# Patient Record
Sex: Female | Born: 1960 | Race: White | Hispanic: No | Marital: Married | Smoking: Never smoker
Health system: Southern US, Community
[De-identification: ages and names within clinical notes are randomized; demographics above are authoritative.]

## PROBLEM LIST (undated history)

## (undated) DIAGNOSIS — R519 Headache, unspecified: Secondary | ICD-10-CM

## (undated) DIAGNOSIS — T7840XA Allergy, unspecified, initial encounter: Secondary | ICD-10-CM

## (undated) DIAGNOSIS — M8580 Other specified disorders of bone density and structure, unspecified site: Secondary | ICD-10-CM

## (undated) DIAGNOSIS — Z923 Personal history of irradiation: Secondary | ICD-10-CM

## (undated) DIAGNOSIS — E785 Hyperlipidemia, unspecified: Secondary | ICD-10-CM

## (undated) HISTORY — DX: Allergy, unspecified, initial encounter: T78.40XA

## (undated) HISTORY — DX: Hyperlipidemia, unspecified: E78.5

## (undated) HISTORY — PX: MOUTH SURGERY: SHX715

## (undated) HISTORY — PX: BREAST LUMPECTOMY: SHX2

## (undated) HISTORY — DX: Other specified disorders of bone density and structure, unspecified site: M85.80

## (undated) HISTORY — PX: POLYPECTOMY: SHX149

## (undated) HISTORY — PX: DILATION AND CURETTAGE OF UTERUS: SHX78

---
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
## 1997-12-21 ENCOUNTER — Inpatient Hospital Stay (HOSPITAL_COMMUNITY): Admission: AD | Admit: 1997-12-21 | Discharge: 1997-12-21 | Payer: Self-pay | Admitting: Obstetrics and Gynecology

## 1998-01-04 ENCOUNTER — Encounter (HOSPITAL_COMMUNITY): Admission: RE | Admit: 1998-01-04 | Discharge: 1998-02-10 | Payer: Self-pay | Admitting: Obstetrics and Gynecology

## 1998-02-09 ENCOUNTER — Inpatient Hospital Stay (HOSPITAL_COMMUNITY): Admission: AD | Admit: 1998-02-09 | Discharge: 1998-02-12 | Payer: Self-pay | Admitting: Obstetrics and Gynecology

## 1998-02-14 ENCOUNTER — Encounter: Admission: RE | Admit: 1998-02-14 | Discharge: 1998-05-15 | Payer: Self-pay | Admitting: Obstetrics and Gynecology

## 1998-12-09 ENCOUNTER — Encounter (HOSPITAL_COMMUNITY): Admission: RE | Admit: 1998-12-09 | Discharge: 1999-03-09 | Payer: Self-pay | Admitting: Obstetrics and Gynecology

## 2000-02-05 ENCOUNTER — Other Ambulatory Visit: Admission: RE | Admit: 2000-02-05 | Discharge: 2000-02-05 | Payer: Self-pay | Admitting: Obstetrics and Gynecology

## 2001-04-08 ENCOUNTER — Other Ambulatory Visit: Admission: RE | Admit: 2001-04-08 | Discharge: 2001-04-08 | Payer: Self-pay | Admitting: Obstetrics and Gynecology

## 2002-05-25 ENCOUNTER — Other Ambulatory Visit: Admission: RE | Admit: 2002-05-25 | Discharge: 2002-05-25 | Payer: Self-pay | Admitting: Obstetrics and Gynecology

## 2004-03-08 ENCOUNTER — Other Ambulatory Visit: Admission: RE | Admit: 2004-03-08 | Discharge: 2004-03-08 | Payer: Self-pay | Admitting: Obstetrics and Gynecology

## 2005-06-29 ENCOUNTER — Other Ambulatory Visit: Admission: RE | Admit: 2005-06-29 | Discharge: 2005-06-29 | Payer: Self-pay | Admitting: Obstetrics and Gynecology

## 2005-07-24 ENCOUNTER — Encounter: Admission: RE | Admit: 2005-07-24 | Discharge: 2005-07-24 | Payer: Self-pay | Admitting: Obstetrics and Gynecology

## 2010-10-28 ENCOUNTER — Encounter: Payer: Self-pay | Admitting: Obstetrics and Gynecology

## 2011-10-12 DIAGNOSIS — K122 Cellulitis and abscess of mouth: Secondary | ICD-10-CM

## 2012-04-09 ENCOUNTER — Ambulatory Visit: Payer: 59 | Admitting: Emergency Medicine

## 2012-04-09 VITALS — BP 138/80 | HR 64 | Temp 98.3°F | Resp 17 | Ht 65.0 in | Wt 131.6 lb

## 2012-04-09 DIAGNOSIS — H8110 Benign paroxysmal vertigo, unspecified ear: Secondary | ICD-10-CM

## 2012-04-09 MED ORDER — MECLIZINE HCL 32 MG PO TABS: 32.0000 mg | ORAL_TABLET | Freq: Three times a day (TID) | ORAL | Status: AC | PRN

## 2012-04-09 NOTE — Progress Notes (Deleted)
  Subjective:    Patient ID: Tamara Lutz, female    DOB: Jul 20, 1961, 51 y.o.   MRN: [id number]  HPI    Review of Systems     Objective:   Physical Exam        Assessment & Plan:

## 2012-04-09 NOTE — Patient Instructions (Addendum)
Vertigo Benign Positional Vertigo Vertigo means you feel like you or your surroundings are moving when they are not. Benign positional vertigo is the most common form of vertigo. Benign means that the cause of your condition is not serious. Benign positional vertigo is more common in older adults. CAUSES  Benign positional vertigo is the result of an upset in the labyrinth system. This is an area in the middle ear that helps control your balance. This may be caused by a viral infection, head injury, or repetitive motion. However, often no specific cause is found. SYMPTOMS  Symptoms of benign positional vertigo occur when you move your head or eyes in different directions. Some of the symptoms may include:  Loss of balance and falls.   Vomiting.   Blurred vision.   Dizziness.   Nausea.   Involuntary eye movements (nystagmus).  DIAGNOSIS  Benign positional vertigo is usually diagnosed by physical exam. If the specific cause of your benign positional vertigo is unknown, your caregiver may perform imaging tests, such as magnetic resonance imaging (MRI) or computed tomography (CT). TREATMENT  Your caregiver may recommend movements or procedures to correct the benign positional vertigo. Medicines such as meclizine, benzodiazepines, and medicines for nausea may be used to treat your symptoms. In rare cases, if your symptoms are caused by certain conditions that affect the inner ear, you may need surgery. HOME CARE INSTRUCTIONS   Follow your caregiver's instructions.   Move slowly. Do not make sudden body or head movements.   Avoid driving.   Avoid operating heavy machinery.   Avoid performing any tasks that would be dangerous to you or others during a vertigo episode.   Drink enough fluids to keep your urine clear or pale yellow.  SEEK IMMEDIATE MEDICAL CARE IF:   You develop problems with walking, weakness, numbness, or using your arms, hands, or legs.   You have difficulty  speaking.   You develop severe headaches.   Your nausea or vomiting continues or gets worse.   You develop visual changes.   Your family or friends notice any behavioral changes.   Your condition gets worse.   You have a fever.   You develop a stiff neck or sensitivity to light.  MAKE SURE YOU:   Understand these instructions.   Will watch your condition.   Will get help right away if you are not doing well or get worse.  Document Released: 07/02/2006 Document Revised: 09/13/2011 Document Reviewed: 06/14/2011 [facility] Patient Information 2012 [place], [place].

## 2012-04-09 NOTE — Progress Notes (Signed)
    Patient Name: Tamara Lutz Date of Birth: [birth year] Medical Record Number: [id number] Gender: female Date of Encounter: 04/09/2012  Chief Complaint: Dizziness   History of Present Illness:  Tamara Lutz is a 51 y.o. very pleasant female patient who presents with the following:  Awoke this am and stretched.  When she attempted to get up, she found that she was profoundly dizzy worse when turns head or changes axis of rotation.  No other neuro or visual symptoms.  Denies antecedent illness or injury. Had previous issue two weeks ago that was short lived  There is no problem list on file for this patient.  No past medical history on file. No past surgical history on file. History  Substance Use Topics  . Smoking status: Never Smoker   . Smokeless tobacco: Not on file  . Alcohol Use: Not on file   No family history on file. No Known Allergies  Medication list has been reviewed and updated.  No current outpatient prescriptions on file prior to visit.    Review of Systems:  As per HPI, otherwise negative.    Physical Examination: Filed Vitals:   04/09/12 1836  BP: 138/80  Pulse: 64  Temp: 98.3 F (36.8 C)  Resp: 17   Filed Vitals:   04/09/12 1836  Height: 5\' 5"  (1.651 m)  Weight: 131 lb 9.6 oz (59.693 kg)   Body mass index is 21.90 kg/(m^2). Ideal Body Weight: Weight in (lb) to have BMI = 25: 149.9   GEN: WDWN, NAD, Non-toxic, A & O x 3 HEENT: Atraumatic, Normocephalic. Neck supple. No masses, No LAD. Ears and Nose: No external deformity. CV: RRR, No M/G/R. No JVD. No thrill. No extra heart sounds. PULM: CTA B, no wheezes, crackles, rhonchi. No retractions. No resp. distress. No accessory muscle use. ABD: S, NT, ND, +BS. No rebound. No HSM. EXTR: No c/c/e NEURO Normal gait.  PSYCH: Normally interactive. Conversant. Not depressed or anxious appearing.  Calm demeanor.    EKG / Labs / Xrays: None available at time of encounter  Assessment and  Plan: Benign positional vertigo antivert  Carmelina Dane, MD

## 2012-04-11 ENCOUNTER — Telehealth: Payer: Self-pay

## 2012-04-11 NOTE — Telephone Encounter (Signed)
Pt's husband is calling in reference to pts vertigo medication that was sent over to the pharmacy two days ago, [facility] on [address] states that the strength that the rx was written for does not exsist. He states that the pharmacy faxed over something to [place] informing us of this situation but has not heard from [place]. Please advise. Best# [id number]

## 2012-04-12 MED ORDER — MECLIZINE HCL 25 MG PO TABS: 25.0000 mg | ORAL_TABLET | Freq: Three times a day (TID) | ORAL | Status: AC | PRN

## 2012-04-12 NOTE — Telephone Encounter (Signed)
rx was sent in for right strength

## 2012-04-12 NOTE — Telephone Encounter (Signed)
lmom that rx was sent in

## 2012-04-22 ENCOUNTER — Other Ambulatory Visit: Payer: Self-pay | Admitting: Obstetrics and Gynecology

## 2012-04-22 DIAGNOSIS — R928 Other abnormal and inconclusive findings on diagnostic imaging of breast: Secondary | ICD-10-CM

## 2012-04-29 ENCOUNTER — Ambulatory Visit
Admission: RE | Admit: 2012-04-29 | Discharge: 2012-04-29 | Disposition: A | Discharge status: Home / Self Care | Payer: 59 | Source: Ambulatory Visit | Attending: Obstetrics and Gynecology | Admitting: Obstetrics and Gynecology

## 2012-04-29 DIAGNOSIS — R928 Other abnormal and inconclusive findings on diagnostic imaging of breast: Secondary | ICD-10-CM

## 2013-05-29 ENCOUNTER — Other Ambulatory Visit: Payer: Self-pay | Admitting: Obstetrics and Gynecology

## 2013-05-29 DIAGNOSIS — Z803 Family history of malignant neoplasm of breast: Secondary | ICD-10-CM

## 2013-05-29 DIAGNOSIS — R922 Inconclusive mammogram: Secondary | ICD-10-CM

## 2017-07-31 ENCOUNTER — Ambulatory Visit (INDEPENDENT_AMBULATORY_CARE_PROVIDER_SITE_OTHER): Payer: Managed Care, Other (non HMO) | Admitting: Family Medicine

## 2017-07-31 ENCOUNTER — Encounter: Payer: Self-pay | Admitting: Family Medicine

## 2017-07-31 VITALS — BP 116/80 | HR 87 | Temp 99.3°F | Resp 18 | Ht 65.0 in | Wt 139.0 lb

## 2017-07-31 DIAGNOSIS — R519 Headache, unspecified: Secondary | ICD-10-CM

## 2017-07-31 DIAGNOSIS — R51 Headache: Secondary | ICD-10-CM

## 2017-07-31 DIAGNOSIS — J310 Chronic rhinitis: Secondary | ICD-10-CM | POA: Diagnosis not present

## 2017-07-31 DIAGNOSIS — J321 Chronic frontal sinusitis: Secondary | ICD-10-CM | POA: Diagnosis not present

## 2017-07-31 DIAGNOSIS — M62838 Other muscle spasm: Secondary | ICD-10-CM | POA: Diagnosis not present

## 2017-07-31 MED ORDER — SUMATRIPTAN 5 MG/ACT NA SOLN: 1.0000 | NASAL | 1 refills | Status: DC | PRN

## 2017-07-31 MED ORDER — MONTELUKAST SODIUM 10 MG PO TABS: 10.0000 mg | ORAL_TABLET | Freq: Every day | ORAL | 3 refills | Status: DC

## 2017-07-31 MED ORDER — FLUTICASONE PROPIONATE 50 MCG/ACT NA SUSP: 2.0000 | Freq: Two times a day (BID) | NASAL | 6 refills | Status: DC

## 2017-07-31 MED ORDER — AZITHROMYCIN 250 MG PO TABS: ORAL_TABLET | ORAL | 0 refills | Status: DC

## 2017-07-31 NOTE — Patient Instructions (Addendum)
1. For headaches use your tylenol as needed but for worsening headaches use imitrex nasal spray for a max of two doses.  2. For daily sinus congestion use flonase twice a day either after showering or after face   3. For daily allergy prevent start montelukast at bedtime and continue for at least a month A daily antihistamine like allegra is still needed to help with daytime symptoms.  4. Once the montelukast starts to work you may cut back on the flonase to once a day and the allegra as needed.   5. Aspercreme with lidocaine is great for stiff muscles  Today you are given a muscle relaxer for shoulder muscle spasms It causes dizziness so you should not operate machinery   IF you received an x-ray today, you will receive an invoice from [facility]. Please contact [facility] at [phone] with questions or concerns regarding your invoice.   IF you received labwork today, you will receive an invoice from [facility]. Please contact [facility] at [phone] with questions or concerns regarding your invoice.   Our billing staff will not be able to assist you with questions regarding bills from these companies.  You will be contacted with the lab results as soon as they are available. The fastest way to get your results is to activate your My Chart account. Instructions are located on the last page of this paperwork. If you have not heard from [place] regarding the results in 2 weeks, please contact this office.     Sinusitis, Adult Sinusitis is soreness and inflammation of your sinuses. Sinuses are hollow spaces in the bones around your face. They are located:  Around your eyes.  In the middle of your forehead.  Behind your nose.  In your cheekbones.  Your sinuses and nasal passages are lined with a stringy fluid (mucus). Mucus normally drains out of your sinuses. When your nasal tissues get inflamed or swollen, the mucus can get trapped or blocked so air cannot flow  through your sinuses. This lets bacteria, viruses, and funguses grow, and that leads to infection. Follow these instructions at home: Medicines  Take, use, or apply over-the-counter and prescription medicines only as told by your doctor. These may include nasal sprays.  If you were prescribed an antibiotic medicine, take it as told by your doctor. Do not stop taking the antibiotic even if you start to feel better. Hydrate and Humidify  Drink enough water to keep your pee (urine) clear or pale yellow.  Use a cool mist humidifier to keep the humidity level in your home above 50%.  Breathe in steam for 10-15 minutes, 3-4 times a day or as told by your doctor. You can do this in the bathroom while a hot shower is running.  Try not to spend time in cool or dry air. Rest  Rest as much as possible.  Sleep with your head raised (elevated).  Make sure to get enough sleep each night. General instructions  Put a warm, moist washcloth on your face 3-4 times a day or as told by your doctor. This will help with discomfort.  Wash your hands often with soap and water. If there is no soap and water, use hand sanitizer.  Do not smoke. Avoid being around people who are smoking (secondhand smoke).  Keep all follow-up visits as told by your doctor. This is important. Contact a doctor if:  You have a fever.  Your symptoms get worse.  Your symptoms do not get better within  10 days. Get help right away if:  You have a very bad headache.  You cannot stop throwing up (vomiting).  You have pain or swelling around your face or eyes.  You have trouble seeing.  You feel confused.  Your neck is stiff.  You have trouble breathing. This information is not intended to replace advice given to you by your health care provider. Make sure you discuss any questions you have with your health care provider. Document Released: 03/12/2008 Document Revised: 05/20/2016 Document Reviewed:  07/20/2015 Elsevier Interactive Patient Education  2018 [employer].     Muscle Cramps and Spasms Muscle cramps and spasms occur when a muscle or muscles tighten and you have no control over this tightening (involuntary muscle contraction). They are a common problem and can develop in any muscle. The most common place is in the calf muscles of the leg. Muscle cramps and muscle spasms are both involuntary muscle contractions, but there are some differences between the two:  Muscle cramps are painful. They come and go and may last a few seconds to 15 minutes. Muscle cramps are often more forceful and last longer than muscle spasms.  Muscle spasms may or may not be painful. They may also last just a few seconds or much longer.  Certain medical conditions, such as diabetes or Parkinson disease, can make it more likely to develop cramps or spasms. However, cramps or spasms are usually not caused by a serious underlying problem. Common causes include:  Overexertion.  Overuse from repetitive motions, or doing the same thing over and over.  Remaining in a certain position for a long period of time.  Improper preparation, form, or technique while playing a sport or doing an activity.  Dehydration.  Injury.  Side effects of some medicines.  Abnormally low levels of the salts and ions in your blood (electrolytes), especially potassium and calcium. This could happen if you are taking water pills (diuretics) or if you are pregnant.  In many cases, the cause of muscle cramps or spasms is unknown. Follow these instructions at home:  Stay well hydrated. Drink enough fluid to keep your urine clear or pale yellow.  Try massaging, stretching, and relaxing the affected muscle.  If directed, apply heat to tight or tense muscles as often as told by your health care provider. Use the heat source that your health care provider recommends, such as a moist heat pack or a heating pad. ? Place a towel  between your skin and the heat source. ? Leave the heat on for 20-30 minutes. ? Remove the heat if your skin turns bright red. This is especially important if you are unable to feel pain, heat, or cold. You may have a greater risk of getting burned.  If directed, put ice on the affected area. This may help if you are sore or have pain after a cramp or spasm. ? Put ice in a plastic bag. ? Place a towel between your skin and the bag. ? Leavethe ice on for 20 minutes, 2-3 times a day.  Take over-the-counter and prescription medicines only as told by your health care provider.  Pay attention to any changes in your symptoms. Contact a health care provider if:  Your cramps or spasms get more severe or happen more often.  Your cramps or spasms do not improve over time. This information is not intended to replace advice given to you by your health care provider. Make sure you discuss any questions you have with  your health care provider. Document Released: 03/16/2002 Document Revised: 10/26/2015 Document Reviewed: 06/28/2015 Elsevier Interactive Patient Education  2018 [employer].

## 2017-07-31 NOTE — Progress Notes (Signed)
Chief Complaint  Patient presents with  . Sinusitis    onset: 07/29/17, TRIED OTC MEDS WITH NO RELIEF, PAIN AND PRESSURE WITH CONGESTION, PAIN IN BACK OF NECK, NOT COUGHING UP ANY DRAINAGE    HPI   Sinusitis: Patient presents with congestion and headache  She takes a daily antihistamine with a benadryl at night. She reports that she has to take tylenol for sinus headaches and has to take a migraine headache pill When her sinus headaches get bad nothing works and she has to take sinus pressure and sinus She reports that her pain is worse on the left side  7/10 She denies dizziness She denies tinnitus She is a little nauseous     4 review of systems  History reviewed. No pertinent past medical history.  Current Outpatient Medications  Medication Sig Dispense Refill  . fluticasone (FLONASE) 50 MCG/ACT nasal spray Place 1 spray 2 (two) times daily into both nostrils. 48 g 3  . montelukast (SINGULAIR) 10 MG tablet Take 1 tablet (10 mg total) at bedtime by mouth. 90 tablet 3  . SUMAtriptan (IMITREX) 5 MG/ACT nasal spray Place 1 spray (5 mg total) into the nose every 2 (two) hours as needed for migraine. 1 Inhaler 1  . triamcinolone cream (KENALOG) 0.1 % Apply 1 application 2 (two) times daily topically. 15 g 1   No current facility-administered medications for this visit.     Allergies: No Known Allergies  History reviewed. No pertinent surgical history.  Social History   Socioeconomic History  . Marital status: Married    Spouse name: Not on file  . Number of children: Not on file  . Years of education: Not on file  . Highest education level: Not on file  Occupational History  . Not on file  Social Needs  . Financial resource strain: Not on file  . Food insecurity:    Worry: Not on file    Inability: Not on file  . Transportation needs:    Medical: Not on file    Non-medical: Not on file  Tobacco Use  . Smoking status: Never Smoker  . Smokeless tobacco: Never  Used  Substance and Sexual Activity  . Alcohol use: No  . Drug use: No  . Sexual activity: Yes  Lifestyle  . Physical activity:    Days per week: Not on file    Minutes per session: Not on file  . Stress: Not on file  Relationships  . Social connections:    Talks on phone: Not on file    Gets together: Not on file    Attends religious service: Not on file    Active member of club or organization: Not on file    Attends meetings of clubs or organizations: Not on file    Relationship status: Not on file  Other Topics Concern  . Not on file  Social History Narrative  . Not on file    History reviewed. No pertinent family history.   ROS Review of Systems See HPI Constitution:  No malaise No diaphoresis Skin: No rash or itching Eyes: no blurry vision, no double vision GU: no dysuria or hematuria Neuro: no dizziness or headaches  all others reviewed and negative   Objective: Vitals:   07/31/17 1526  BP: 116/80  Pulse: 87  Resp: 18  Temp: 99.3 F (37.4 C)  TempSrc: Oral  SpO2: 99%  Weight: 139 lb (63 kg)  Height: 5\' 5"  (1.651 m)    Physical Exam General:  alert, oriented, in NAD Head: normocephalic, atraumatic, +frontal sinus tenderness Eyes: EOM intact, no scleral icterus or conjunctival injection Ears: TM clear bilaterally Nose: mucosa erythematous and edematous Throat: no pharyngeal exudate or erythema Lymph: no posterior auricular, submental or cervical lymph adenopathy Heart: normal rate, normal sinus rhythm, no murmurs Lungs: clear to auscultation bilaterally, no wheezing   Assessment and Plan Andreea was seen today for sinusitis.  Diagnoses and all orders for this visit:  Chronic rhinitis-  Discussed continued antihistamine flonase   Sinus headache Advised antihistamine and imitrex -     SUMAtriptan (IMITREX) 5 MG/ACT nasal spray; Place 1 spray (5 mg total) into the nose every 2 (two) hours as needed for migraine.  Chronic frontal sinusitis   Singulair, zithromax  Muscle spasms of neck Topical rub with aspercreme    Reida Hem A Nolon Rod

## 2017-08-13 NOTE — Progress Notes (Signed)
Chief Complaint  Patient presents with  . Rash    both wrist, using caladril on it.  Onset: one month ago went away and now back for approx. 2 weeks, very itchy but not painful    HPI   Patient has a rash on both wrist that she thought might have been poison oak but she has not been out in the yard or exposed. She denies any history of new detergents or exposures to new lotions. She used caladryl without improvement.  She is going to [place] and wants to get some treatment before it gets worse.  4 review of systems  No past medical history on file.  Current Outpatient Medications  Medication Sig Dispense Refill  . fluticasone (FLONASE) 50 MCG/ACT nasal spray Place 1 spray 2 (two) times daily into both nostrils. 48 g 3  . montelukast (SINGULAIR) 10 MG tablet Take 1 tablet (10 mg total) at bedtime by mouth. 90 tablet 3  . SUMAtriptan (IMITREX) 5 MG/ACT nasal spray Place 1 spray (5 mg total) into the nose every 2 (two) hours as needed for migraine. 1 Inhaler 1  . triamcinolone cream (KENALOG) 0.1 % Apply 1 application 2 (two) times daily topically. 15 g 1   No current facility-administered medications for this visit.     Allergies: No Known Allergies  No past surgical history on file.  Social History   Socioeconomic History  . Marital status: Married    Spouse name: None  . Number of children: None  . Years of education: None  . Highest education level: None  Social Needs  . Financial resource strain: None  . Food insecurity - worry: None  . Food insecurity - inability: None  . Transportation needs - medical: None  . Transportation needs - non-medical: None  Occupational History  . None  Tobacco Use  . Smoking status: Never Smoker  . Smokeless tobacco: Never Used  Substance and Sexual Activity  . Alcohol use: No  . Drug use: No  . Sexual activity: Yes  Other Topics Concern  . None  Social History Narrative  . None    No family history on file.   ROS Review of  Systems See HPI Constitution: No fevers or chills No malaise No diaphoresis Skin: No rash or itching Eyes: no blurry vision, no double vision GU: no dysuria or hematuria Neuro: no dizziness or headaches * all others reviewed and negative   Objective: Vitals:   08/14/17 1627  BP: 124/70  Pulse: 78  Resp: 16  Temp: 98.7 F (37.1 C)  TempSrc: Oral  SpO2: 98%  Weight: 140 lb 3.2 oz (63.6 kg)  Height: 5\' 5"  (1.651 m)    Physical Exam  Constitutional: She appears well-developed and well-nourished.  HENT:  Head: Normocephalic and atraumatic.  Eyes: Conjunctivae and EOM are normal.  Cardiovascular: Normal rate, regular rhythm and normal heart sounds.  Pulmonary/Chest: Effort normal and breath sounds normal. No stridor. No respiratory distress. She has no wheezes.  Skin: Skin is warm. Rash noted. No abrasion, no bruising, no burn, no ecchymosis, no laceration and no petechiae noted. Rash is vesicular.       Assessment and Plan Tamara Lutz was seen today for rash.  Diagnoses and all orders for this visit:  Rash, vesicular- advise topical steroid, mild soap and only luke warm water -     triamcinolone cream (KENALOG) 0.1 %; Apply 1 application 2 (two) times daily topically.  Other orders -     Cancel: Flu  Vaccine QUAD 36+ mos IM -     Cancel: Tdap vaccine greater than or equal to 7yo IM -     Cancel: MM Digital Screening; Future -     Cancel: Ambulatory referral to Gastroenterology -     montelukast (SINGULAIR) 10 MG tablet; Take 1 tablet (10 mg total) at bedtime by mouth. -     fluticasone (FLONASE) 50 MCG/ACT nasal spray; Place 1 spray 2 (two) times daily into both nostrils.     [place]

## 2017-08-14 ENCOUNTER — Encounter: Payer: Self-pay | Admitting: Family Medicine

## 2017-08-14 ENCOUNTER — Ambulatory Visit: Payer: Managed Care, Other (non HMO) | Admitting: Family Medicine

## 2017-08-14 ENCOUNTER — Ambulatory Visit (INDEPENDENT_AMBULATORY_CARE_PROVIDER_SITE_OTHER): Payer: Managed Care, Other (non HMO) | Admitting: Family Medicine

## 2017-08-14 VITALS — BP 124/70 | HR 78 | Temp 98.7°F | Resp 16 | Ht 65.0 in | Wt 140.2 lb

## 2017-08-14 DIAGNOSIS — R238 Other skin changes: Secondary | ICD-10-CM

## 2017-08-14 MED ORDER — TRIAMCINOLONE ACETONIDE 0.1 % EX CREA: 1.0000 "application " | TOPICAL_CREAM | Freq: Two times a day (BID) | CUTANEOUS | 1 refills | Status: DC

## 2017-08-14 MED ORDER — FLUTICASONE PROPIONATE 50 MCG/ACT NA SUSP: 1.0000 | Freq: Two times a day (BID) | NASAL | 3 refills | Status: DC

## 2017-08-14 MED ORDER — MONTELUKAST SODIUM 10 MG PO TABS: 10.0000 mg | ORAL_TABLET | Freq: Every day | ORAL | 3 refills | Status: DC

## 2017-08-14 NOTE — Patient Instructions (Addendum)
     IF you received an x-ray today, you will receive an invoice from [facility]. Please contact [facility] at [phone] with questions or concerns regarding your invoice.   IF you received labwork today, you will receive an invoice from [place] [facility]. Please contact [facility] at [phone] with questions or concerns regarding your invoice.   Our billing staff will not be able to assist you with questions regarding bills from these companies.  You will be contacted with the lab results as soon as they are available. The fastest way to get your results is to activate your My Chart account. Instructions are located on the last page of this paperwork. If you have not heard from [place] regarding the results in 2 weeks, please contact this office.     Hand Dermatitis Hand dermatitis is a skin condition that causes small, itchy, raised dots or fluid-filled blisters to form over the palms of the hands. This condition may also be called hand eczema. What are the causes? The cause of this condition is not known. What increases the risk? This condition is more likely to develop in people who have a history of allergies, such as:  Hay fever.  Allergic asthma.  An allergy to latex.  Chemical exposure, injuries, and environmental irritants can make hand dermatitis worse. Washing your hands too often can remove natural oils, which can dry out the skin and contribute to outbreaks of this condition. What are the signs or symptoms? The most common symptom of this condition is intense itchiness. Cracks or grooves (fissures) on the fingers can also develop. Affected areas can be painful, especially areas where large blisters have formed. How is this diagnosed? This condition is diagnosed with a medical history and physical exam. How is this treated? This condition is treated with medicines, including:  Steroid creams and ointments.  Oral steroid medicines.  Antibiotic medicines.  These are prescribed if you have an infection.  Antihistamine medicines. These help to reduce itchiness.  Follow these instructions at home:  Take or apply over-the-counter and prescription medicines only as told by your health care provider.  If you were prescribed an antibiotic medicine, use it as told by your health care provider. Do not stop using the antibiotic even if you start to feel better.  Avoid washing your hands more often than necessary.  Avoid using harsh chemicals on your hands.  Wear protective gloves when you handle products that can irritate your skin.  Keep all follow-up visits as told by your health care provider. This is important. Contact a health care provider if:  Your rash does not improve during the first week of treatment.  Your rash is red or tender.  Your rash has pus coming from it.  Your rash spreads. This information is not intended to replace advice given to you by your health care provider. Make sure you discuss any questions you have with your health care provider. Document Released: 09/24/2005 Document Revised: 03/01/2016 Document Reviewed: 04/08/2015 Elsevier Interactive Patient Education  [employer].

## 2017-10-08 HISTORY — PX: COLONOSCOPY: SHX174

## 2018-02-16 ENCOUNTER — Encounter: Payer: Self-pay | Admitting: Family Medicine

## 2018-04-14 ENCOUNTER — Encounter: Payer: Self-pay | Admitting: Family Medicine

## 2018-04-14 ENCOUNTER — Ambulatory Visit (INDEPENDENT_AMBULATORY_CARE_PROVIDER_SITE_OTHER): Payer: Managed Care, Other (non HMO) | Admitting: Family Medicine

## 2018-04-14 VITALS — BP 108/70 | HR 83 | Temp 98.8°F | Resp 16 | Ht 64.0 in | Wt 141.8 lb

## 2018-04-14 DIAGNOSIS — Z0000 Encounter for general adult medical examination without abnormal findings: Secondary | ICD-10-CM

## 2018-04-14 DIAGNOSIS — R6889 Other general symptoms and signs: Secondary | ICD-10-CM

## 2018-04-14 DIAGNOSIS — Z13220 Encounter for screening for lipoid disorders: Secondary | ICD-10-CM | POA: Diagnosis not present

## 2018-04-14 DIAGNOSIS — Z1329 Encounter for screening for other suspected endocrine disorder: Secondary | ICD-10-CM

## 2018-04-14 DIAGNOSIS — B078 Other viral warts: Secondary | ICD-10-CM

## 2018-04-14 DIAGNOSIS — R519 Headache, unspecified: Secondary | ICD-10-CM

## 2018-04-14 DIAGNOSIS — R51 Headache: Secondary | ICD-10-CM | POA: Diagnosis not present

## 2018-04-14 DIAGNOSIS — Z131 Encounter for screening for diabetes mellitus: Secondary | ICD-10-CM

## 2018-04-14 DIAGNOSIS — Z1211 Encounter for screening for malignant neoplasm of colon: Secondary | ICD-10-CM

## 2018-04-14 DIAGNOSIS — Z23 Encounter for immunization: Secondary | ICD-10-CM | POA: Diagnosis not present

## 2018-04-14 DIAGNOSIS — G44229 Chronic tension-type headache, not intractable: Secondary | ICD-10-CM | POA: Diagnosis not present

## 2018-04-14 DIAGNOSIS — Z1159 Encounter for screening for other viral diseases: Secondary | ICD-10-CM

## 2018-04-14 MED ORDER — SUMATRIPTAN 5 MG/ACT NA SOLN: 1.0000 | NASAL | 1 refills | Status: DC | PRN

## 2018-04-14 NOTE — Patient Instructions (Addendum)
IF you received an x-ray today, you will receive an invoice from [facility]. Please contact [facility] at [phone] with questions or concerns regarding your invoice.   IF you received labwork today, you will receive an invoice from [facility]. Please contact [facility] at [phone] with questions or concerns regarding your invoice.   Our billing staff will not be able to assist you with questions regarding bills from these companies.  You will be contacted with the lab results as soon as they are available. The fastest way to get your results is to activate your My Chart account. Instructions are located on the last page of this paperwork. If you have not heard from [place] regarding the results in 2 weeks, please contact this office.     Colonoscopy, Adult A colonoscopy is an exam to look at the entire large intestine. During the exam, a lubricated, bendable tube is inserted into the anus and then passed into the rectum, colon, and other parts of the large intestine. A colonoscopy is often done as a part of normal colorectal screening or in response to certain symptoms, such as anemia, persistent diarrhea, abdominal pain, and blood in the stool. The exam can help screen for and diagnose medical problems, including:  Tumors.  Polyps.  Inflammation.  Areas of bleeding.  Tell a health care provider about:  Any allergies you have.  All medicines you are taking, including vitamins, herbs, eye drops, creams, and over-the-counter medicines.  Any problems you or family members have had with anesthetic medicines.  Any blood disorders you have.  Any surgeries you have had.  Any medical conditions you have.  Any problems you have had passing stool. What are the risks? Generally, this is a safe procedure. However, problems may occur, including:  Bleeding.  A tear in the intestine.  A reaction to medicines given during the exam.  Infection (rare).  What  happens before the procedure? Eating and drinking restrictions Follow instructions from your health care provider about eating and drinking, which may include:  A few days before the procedure - follow a low-fiber diet. Avoid nuts, seeds, dried fruit, raw fruits, and vegetables.  1-3 days before the procedure - follow a clear liquid diet. Drink only clear liquids, such as clear broth or bouillon, black coffee or tea, clear juice, clear soft drinks or sports drinks, gelatin dessert, and popsicles. Avoid any liquids that contain red or purple dye.  On the day of the procedure - do not eat or drink anything during the 2 hours before the procedure, or within the time period that your health care provider recommends.  Bowel prep If you were prescribed an oral bowel prep to clean out your colon:  Take it as told by your health care provider. Starting the day before your procedure, you will need to drink a large amount of medicated liquid. The liquid will cause you to have multiple loose stools until your stool is almost clear or light green.  If your skin or anus gets irritated from diarrhea, you may use these to relieve the irritation: ? Medicated wipes, such as adult wet wipes with aloe and vitamin E. ? A skin soothing-product like petroleum jelly.  If you vomit while drinking the bowel prep, take a break for up to 60 minutes and then begin the bowel prep again. If vomiting continues and you cannot take the bowel prep without vomiting, call your health care provider.  General instructions  Ask your health care  provider about changing or stopping your regular medicines. This is especially important if you are taking diabetes medicines or blood thinners.  Plan to have someone take you home from the hospital or clinic. What happens during the procedure?  An IV tube may be inserted into one of your veins.  You will be given medicine to help you relax (sedative).  To reduce your risk of  infection: ? Your health care team will wash or sanitize their hands. ? Your anal area will be washed with soap.  You will be asked to lie on your side with your knees bent.  Your health care provider will lubricate a long, thin, flexible tube. The tube will have a camera and a light on the end.  The tube will be inserted into your anus.  The tube will be gently eased through your rectum and colon.  Air will be delivered into your colon to keep it open. You may feel some pressure or cramping.  The camera will be used to take images during the procedure.  A small tissue sample may be removed from your body to be examined under a microscope (biopsy). If any potential problems are found, the tissue will be sent to a lab for testing.  If small polyps are found, your health care provider may remove them and have them checked for cancer cells.  The tube that was inserted into your anus will be slowly removed. The procedure may vary among health care providers and hospitals. What happens after the procedure?  Your blood pressure, heart rate, breathing rate, and blood oxygen level will be monitored until the medicines you were given have worn off.  Do not drive for 24 hours after the exam.  You may have a small amount of blood in your stool.  You may pass gas and have mild abdominal cramping or bloating due to the air that was used to inflate your colon during the exam.  It is up to you to get the results of your procedure. Ask your health care provider, or the department performing the procedure, when your results will be ready. This information is not intended to replace advice given to you by your health care provider. Make sure you discuss any questions you have with your health care provider. Document Released: 09/21/2000 Document Revised: 07/25/2016 Document Reviewed: 12/06/2015 Elsevier Interactive Patient Education  2018 [employer].

## 2018-04-14 NOTE — Progress Notes (Signed)
Chief Complaint  Patient presents with  . Annual Exam    cpe w/o pap    Subjective:  Tamara Lutz is a 57 y.o. female here for a health maintenance visit.  Patient is established pt  Warts She reports that she has two warts on her right hand not resolving with compound W and picking at it.  She has some irritation there. She had plantar warts but they resolved.  Headaches  She reports that her headaches are daily during the school year and starts from her neck and goes down into her shoulders and up into her head. Generic excedrin HA helps her. She has not been using the sumatriptan because she was only given 5. Once the school year lets out she has less episodes.  She denies any vision changes or upper extremity weakness with the headaches.   There are no active problems to display for this patient.   No past medical history on file.  No past surgical history on file.   Outpatient Medications Prior to Visit  Medication Sig Dispense Refill  . fluticasone (FLONASE) 50 MCG/ACT nasal spray Place 1 spray 2 (two) times daily into both nostrils. 48 g 3  . SUMAtriptan (IMITREX) 5 MG/ACT nasal spray Place 1 spray (5 mg total) into the nose every 2 (two) hours as needed for migraine. 1 Inhaler 1  . montelukast (SINGULAIR) 10 MG tablet Take 1 tablet (10 mg total) at bedtime by mouth. 90 tablet 3  . triamcinolone cream (KENALOG) 0.1 % Apply 1 application 2 (two) times daily topically. (Patient not taking: Reported on 04/14/2018) 15 g 1   No facility-administered medications prior to visit.     No Known Allergies   Family History  Problem Relation Age of Onset  . Multiple sclerosis Mother   . Uterine cancer Mother   . Alzheimer's disease Father   . Breast cancer Sister   . Diabetes Neg Hx   . Heart disease Neg Hx   . Hypertension Neg Hx      Health Habits: Dental Exam: up to date Eye Exam: up to date Exercise:  times/week on average Current exercise activities:  walking/running Diet: balanced   Social History   Socioeconomic History  . Marital status: Married    Spouse name: Not on file  . Number of children: Not on file  . Years of education: Not on file  . Highest education level: Not on file  Occupational History  . Not on file  Social Needs  . Financial resource strain: Not on file  . Food insecurity:    Worry: Not on file    Inability: Not on file  . Transportation needs:    Medical: Not on file    Non-medical: Not on file  Tobacco Use  . Smoking status: Never Smoker  . Smokeless tobacco: Never Used  Substance and Sexual Activity  . Alcohol use: No  . Drug use: No  . Sexual activity: Yes  Lifestyle  . Physical activity:    Days per week: Not on file    Minutes per session: Not on file  . Stress: Not on file  Relationships  . Social connections:    Talks on phone: Not on file    Gets together: Not on file    Attends religious service: Not on file    Active member of club or organization: Not on file    Attends meetings of clubs or organizations: Not on file    Relationship status:  Not on file  . Intimate partner violence:    Fear of current or ex partner: Not on file    Emotionally abused: Not on file    Physically abused: Not on file    Forced sexual activity: Not on file  Other Topics Concern  . Not on file  Social History Narrative  . Not on file   Social History   Substance and Sexual Activity  Alcohol Use No   Social History   Tobacco Use  Smoking Status Never Smoker  Smokeless Tobacco Never Used   Social History   Substance and Sexual Activity  Drug Use No    GYN: Sexual Health Menstrual status: regular menses LMP: Patient's last menstrual period was 04/09/2012. Last pap smear: see HM section History of abnormal pap smears:  Sexually active:  with female partner Current contraception: postmenopausal  Health Maintenance: See under health Maintenance activity for review of completion dates as  well. Immunization History  Administered Date(s) Administered  . Tdap 04/14/2018      Depression Screen-PHQ2/9 Depression screen [facility] 2/9 04/14/2018 08/14/2017 07/31/2017  Decreased Interest 0 0 0  Down, Depressed, Hopeless 0 0 0  PHQ - 2 Score 0 0 0     Depression Severity and Treatment Recommendations:  0-4= None  5-9= Mild / Treatment: Support, educate to call if worse; return in one month  10-14= Moderate / Treatment: Support, watchful waiting; Antidepressant or Psycotherapy  15-19= Moderately severe / Treatment: Antidepressant OR Psychotherapy  >= 20 = Major depression, severe / Antidepressant AND Psychotherapy    Review of Systems   Review of Systems  Constitutional: Negative for chills, fever and weight loss.  HENT: Negative for congestion, nosebleeds and sinus pain.   Respiratory: Negative for cough, shortness of breath and wheezing.   Cardiovascular: Negative for chest pain, palpitations and orthopnea.  Gastrointestinal: Negative for abdominal pain, nausea and vomiting.  Genitourinary: Negative for dysuria, frequency and urgency.  Musculoskeletal: Negative for myalgias and neck pain.  Skin: Negative for itching and rash.  Neurological: Negative for dizziness, tingling, tremors and headaches.  Psychiatric/Behavioral: Negative for depression. The patient is not nervous/anxious.     See HPI for ROS as well.    Objective:   Vitals:   04/14/18 0842  BP: 108/70  Pulse: 83  Resp: 16  Temp: 98.8 F (37.1 C)  TempSrc: Oral  SpO2: 98%  Weight: 141 lb 12.8 oz (64.3 kg)  Height: 5\' 4"  (1.626 m)    Body mass index is 24.34 kg/m.  Physical Exam  Constitutional: She is oriented to person, place, and time. She appears well-developed and well-nourished.  HENT:  Head: Normocephalic and atraumatic.  Right Ear: External ear normal.  Left Ear: External ear normal.  Mouth/Throat: Oropharynx is clear and moist.  Eyes: Conjunctivae and EOM are normal.  Neck: Normal  range of motion. Neck supple. No thyromegaly present.  Cardiovascular: Normal rate, regular rhythm and normal heart sounds.  No murmur heard. Pulmonary/Chest: Effort normal and breath sounds normal. No stridor. No respiratory distress. She has no wheezes. She has no rales.  Abdominal: Soft. Bowel sounds are normal. She exhibits no distension and no mass. There is no tenderness. There is no rebound and no guarding.  Musculoskeletal: Normal range of motion. She exhibits no edema.  Neurological: She is alert and oriented to person, place, and time.  Skin: Skin is warm. Capillary refill takes less than 2 seconds.  Wart noted on index finger and 4th digit  Psychiatric: She  has a normal mood and affect. Her behavior is normal. Judgment and thought content normal.       Assessment/Plan:   Patient was seen for a health maintenance exam.  Counseled the patient on health maintenance issues. Reviewed her health mainteance schedule and ordered appropriate tests (see orders.) Counseled on regular exercise and weight management. Recommend regular eye exams and dental cleaning.   The following issues were addressed today for health maintenance:   Tamara Lutz was seen today for annual exam.  Diagnoses and all orders for this visit:  Encounter for health maintenance examination in adult Women's Health Maintenance Plan Advised monthly breast exam and annual mammogram Advised dental exam every six months Discussed stress management Discussed pap smear screening guidelines  -     HCV Ab w/Rflx to Verification -     Comprehensive metabolic panel -     Lipid panel  Special screening for malignant neoplasms, colon -     Ambulatory referral to Gastroenterology  Need for Tdap vaccination -     Tdap vaccine greater than or equal to 7yo IM  Screening for thyroid disorder  Screening, lipid -     Comprehensive metabolic panel -     Lipid panel  Heat intolerance -     TSH  Screening for diabetes  mellitus -     Hemoglobin A1c  Encounter for hepatitis C screening test for low risk patient -     HCV Ab w/Rflx to Verification  Other viral warts- froze 2 warts with cryotherapy performed today  Sinus headache- refilled imitrex -     SUMAtriptan (IMITREX) 5 MG/ACT nasal spray; Place 1 spray (5 mg total) into the nose every 2 (two) hours as needed for migraine.  Chronic tension-type headache, not intractable- referral to PT so that she can get some relief as her musculoskeletal pain  -     Ambulatory referral to Physical Therapy -     SUMAtriptan (IMITREX) 5 MG/ACT nasal spray; Place 1 spray (5 mg total) into the nose every 2 (two) hours as needed for migraine.    No follow-ups on file.    Body mass index is 24.34 kg/m.:  Discussed the patient's BMI with patient. The BMI body mass index is 24.34 kg/m.     No future appointments.  Patient Instructions       IF you received an x-ray today, you will receive an invoice from [facility]. Please contact [facility] at [phone] with questions or concerns regarding your invoice.   IF you received labwork today, you will receive an invoice from [facility]. Please contact [facility] at [phone] with questions or concerns regarding your invoice.   Our billing staff will not be able to assist you with questions regarding bills from these companies.  You will be contacted with the lab results as soon as they are available. The fastest way to get your results is to activate your My Chart account. Instructions are located on the last page of this paperwork. If you have not heard from [place] regarding the results in 2 weeks, please contact this office.     Colonoscopy, Adult A colonoscopy is an exam to look at the entire large intestine. During the exam, a lubricated, bendable tube is inserted into the anus and then passed into the rectum, colon, and other parts of the large intestine. A colonoscopy is often done as a  part of normal colorectal screening or in response to certain symptoms, such as anemia, persistent diarrhea, abdominal  pain, and blood in the stool. The exam can help screen for and diagnose medical problems, including:  Tumors.  Polyps.  Inflammation.  Areas of bleeding.  Tell a health care provider about:  Any allergies you have.  All medicines you are taking, including vitamins, herbs, eye drops, creams, and over-the-counter medicines.  Any problems you or family members have had with anesthetic medicines.  Any blood disorders you have.  Any surgeries you have had.  Any medical conditions you have.  Any problems you have had passing stool. What are the risks? Generally, this is a safe procedure. However, problems may occur, including:  Bleeding.  A tear in the intestine.  A reaction to medicines given during the exam.  Infection (rare).  What happens before the procedure? Eating and drinking restrictions Follow instructions from your health care provider about eating and drinking, which may include:  A few days before the procedure - follow a low-fiber diet. Avoid nuts, seeds, dried fruit, raw fruits, and vegetables.  1-3 days before the procedure - follow a clear liquid diet. Drink only clear liquids, such as clear broth or bouillon, black coffee or tea, clear juice, clear soft drinks or sports drinks, gelatin dessert, and popsicles. Avoid any liquids that contain red or purple dye.  On the day of the procedure - do not eat or drink anything during the 2 hours before the procedure, or within the time period that your health care provider recommends.  Bowel prep If you were prescribed an oral bowel prep to clean out your colon:  Take it as told by your health care provider. Starting the day before your procedure, you will need to drink a large amount of medicated liquid. The liquid will cause you to have multiple loose stools until your stool is almost clear or  light green.  If your skin or anus gets irritated from diarrhea, you may use these to relieve the irritation: ? Medicated wipes, such as adult wet wipes with aloe and vitamin E. ? A skin soothing-product like petroleum jelly.  If you vomit while drinking the bowel prep, take a break for up to 60 minutes and then begin the bowel prep again. If vomiting continues and you cannot take the bowel prep without vomiting, call your health care provider.  General instructions  Ask your health care provider about changing or stopping your regular medicines. This is especially important if you are taking diabetes medicines or blood thinners.  Plan to have someone take you home from the hospital or clinic. What happens during the procedure?  An IV tube may be inserted into one of your veins.  You will be given medicine to help you relax (sedative).  To reduce your risk of infection: ? Your health care team will wash or sanitize their hands. ? Your anal area will be washed with soap.  You will be asked to lie on your side with your knees bent.  Your health care provider will lubricate a long, thin, flexible tube. The tube will have a camera and a light on the end.  The tube will be inserted into your anus.  The tube will be gently eased through your rectum and colon.  Air will be delivered into your colon to keep it open. You may feel some pressure or cramping.  The camera will be used to take images during the procedure.  A small tissue sample may be removed from your body to be examined under a microscope (biopsy). If any  potential problems are found, the tissue will be sent to a lab for testing.  If small polyps are found, your health care provider may remove them and have them checked for cancer cells.  The tube that was inserted into your anus will be slowly removed. The procedure may vary among health care providers and hospitals. What happens after the procedure?  Your blood  pressure, heart rate, breathing rate, and blood oxygen level will be monitored until the medicines you were given have worn off.  Do not drive for 24 hours after the exam.  You may have a small amount of blood in your stool.  You may pass gas and have mild abdominal cramping or bloating due to the air that was used to inflate your colon during the exam.  It is up to you to get the results of your procedure. Ask your health care provider, or the department performing the procedure, when your results will be ready. This information is not intended to replace advice given to you by your health care provider. Make sure you discuss any questions you have with your health care provider. Document Released: 09/21/2000 Document Revised: 07/25/2016 Document Reviewed: 12/06/2015 Elsevier Interactive Patient Education  2018 [employer].

## 2018-04-15 ENCOUNTER — Other Ambulatory Visit: Payer: Self-pay | Admitting: Family Medicine

## 2018-04-15 ENCOUNTER — Encounter: Payer: Self-pay | Admitting: Gastroenterology

## 2018-04-15 LAB — COMPREHENSIVE METABOLIC PANEL
ALT: 19 IU/L (ref 0–32)
AST: 23 IU/L (ref 0–40)
Albumin/Globulin Ratio: 1.7 (ref 1.2–2.2)
Albumin: 4.6 g/dL (ref 3.5–5.5)
Alkaline Phosphatase: 88 IU/L (ref 39–117)
BUN/Creatinine Ratio: 20 (ref 9–23)
BUN: 17 mg/dL (ref 6–24)
Bilirubin Total: <0.2 mg/dL (ref 0.0–1.2)
CO2: 24 mmol/L (ref 20–29)
Calcium: 9.8 mg/dL (ref 8.7–10.2)
Chloride: 105 mmol/L (ref 96–106)
Creatinine, Ser: 0.86 mg/dL (ref 0.57–1.00)
GFR calc Af Amer: 87 mL/min/{1.73_m2} (ref >59)
GFR calc non Af Amer: 76 mL/min/{1.73_m2} (ref >59)
Globulin, Total: 2.7 g/dL (ref 1.5–4.5)
Glucose: 74 mg/dL (ref 65–99)
Potassium: 4.2 mmol/L (ref 3.5–5.2)
Sodium: 143 mmol/L (ref 134–144)
Total Protein: 7.3 g/dL (ref 6.0–8.5)

## 2018-04-15 LAB — HCV INTERPRETATION

## 2018-04-15 LAB — LIPID PANEL
Chol/HDL Ratio: 5 ratio — ABNORMAL HIGH (ref 0.0–4.4)
Cholesterol, Total: 273 mg/dL — ABNORMAL HIGH (ref 100–199)
HDL: 55 mg/dL (ref >39)
LDL Calculated: 198 mg/dL — ABNORMAL HIGH (ref 0–99)
Triglycerides: 99 mg/dL (ref 0–149)
VLDL Cholesterol Cal: 20 mg/dL (ref 5–40)

## 2018-04-15 LAB — HEMOGLOBIN A1C
Est. average glucose Bld gHb Est-mCnc: 100 mg/dL
Hgb A1c MFr Bld: 5.1 % (ref 4.8–5.6)

## 2018-04-15 LAB — TSH: TSH: 3.1 u[IU]/mL (ref 0.450–4.500)

## 2018-04-15 LAB — HCV AB W/RFLX TO VERIFICATION: HCV Ab: <0.1 s/co ratio (ref 0.0–0.9)

## 2018-04-15 MED ORDER — ATORVASTATIN CALCIUM 10 MG PO TABS: 10.0000 mg | ORAL_TABLET | Freq: Every day | ORAL | 0 refills | Status: DC

## 2018-04-30 ENCOUNTER — Ambulatory Visit (AMBULATORY_SURGERY_CENTER): Payer: Self-pay

## 2018-04-30 VITALS — Ht 64.0 in | Wt 144.4 lb

## 2018-04-30 DIAGNOSIS — Z1211 Encounter for screening for malignant neoplasm of colon: Secondary | ICD-10-CM

## 2018-04-30 MED ORDER — SOD PICOSULFATE-MAG OX-CIT ACD 10-3.5-12 MG-GM -GM/160ML PO SOLN: 1.0000 | Freq: Once | ORAL | 0 refills | Status: AC

## 2018-04-30 NOTE — Progress Notes (Signed)
Denies allergies to eggs or soy products. Denies complication of anesthesia or sedation. Denies use of weight loss medication. Denies use of O2.   Emmi instructions declined.   Coupon pay as little as 40 dollars for Clenpiq was given to patient.

## 2018-05-01 ENCOUNTER — Encounter: Payer: Self-pay | Admitting: Physical Therapy

## 2018-05-01 ENCOUNTER — Ambulatory Visit: Payer: Managed Care, Other (non HMO) | Attending: Family Medicine | Admitting: Physical Therapy

## 2018-05-01 ENCOUNTER — Other Ambulatory Visit: Payer: Self-pay

## 2018-05-01 DIAGNOSIS — R252 Cramp and spasm: Secondary | ICD-10-CM | POA: Diagnosis present

## 2018-05-01 DIAGNOSIS — G44201 Tension-type headache, unspecified, intractable: Secondary | ICD-10-CM | POA: Insufficient documentation

## 2018-05-01 NOTE — Therapy (Signed)
[place] [place], [place], [address] Phone: [phone]   Fax:  [phone]  Physical Therapy Evaluation  Patient Details  Name: Tamara Lutz MRN: [id number] Date of Birth: [birth year] Referring Provider: Nolon Rod   Encounter Date: 05/01/2018  PT End of Session - 05/01/18 1513    Visit Number  1    Date for PT Re-Evaluation  07/02/18    PT Start Time  1440    PT Stop Time  1535    PT Time Calculation (min)  55 min    Activity Tolerance  Patient tolerated treatment well    Behavior During Therapy  [facility] for tasks assessed/performed       Past Medical History:  Diagnosis Date  . Allergy   . Hyperlipidemia   . Osteopenia     Past Surgical History:  Procedure Laterality Date  . CESAREAN SECTION    . DILATION AND CURETTAGE OF UTERUS    . MOUTH SURGERY      There were no vitals filed for this visit.   Subjective Assessment - 05/01/18 1445    Subjective  Patient reports stress and tension in the neck area mostly onthe right for many years, reports HA's have been worse over the past two years.  Mostly on the right and the posterior head.    Patient Stated Goals  have less HA    Currently in Pain?  Yes    Pain Score  1     Pain Location  Head    Pain Orientation  Right    Pain Descriptors / Indicators  Aching;Sore;Spasm    Pain Type  Chronic pain    Pain Radiating Towards  denies    Pain Onset  More than a month ago    Pain Frequency  Constant    Aggravating Factors   work, stress, reading and writing bent over position 8/10    Pain Relieving Factors  medication, exercise,  muscle rub helps at times, pain can be 1-2/10    Effect of Pain on Daily Activities  just really hurts         [facility] PT Assessment - 05/01/18 0001      Assessment   Medical Diagnosis  tension HA    Referring Provider  Stallings    Onset Date/Surgical Date  04/01/18    Prior Therapy  no      Precautions   Precautions  None      Balance Screen   Has the patient fallen in the past 6 months  No    Has the patient had a decrease in activity level because of a fear of falling?   No    Is the patient reluctant to leave their home because of a fear of falling?   No      Home Environment   Additional Comments  does housework, Haematologist,       Prior Function   Level of Independence  Independent    Vocation  Full time employment    Veterinary surgeon    Leisure  some step aerobics, walks      Posture/Postural Control   Posture Comments  slight forward head, rounded hsoulders      ROM / Strength   AROM / PROM / Strength  AROM;Strength      AROM   Overall AROM Comments  cervical ROM decreased 25% for all motions except extension and left side bending decreased  50% with pain in the right upper trap area      Strength   Overall Strength Comments  WFL's      Palpation   Palpation comment  significant tightness in the right upper trap and cervical area, tender with spasms, large trigger point in the right upper trap.                Objective measurements completed on examination: See above findings.      [place] Adult PT Treatment/Exercise - 05/01/18 0001      Modalities   Modalities  Electrical Stimulation;Moist Heat      Moist Heat Therapy   Number Minutes Moist Heat  15 Minutes    Moist Heat Location  Cervical      Electrical Stimulation   Electrical Stimulation Location  right upper trap area    Electrical Stimulation Action  IFC    Electrical Stimulation Parameters  supine    Electrical Stimulation Goals  Pain       Trigger Point Dry Needling - 05/01/18 1519    Consent Given?  Yes    Education Handout Provided  Yes    Muscles Treated Upper Body  Upper trapezius    Upper Trapezius Response  Twitch reponse elicited;Palpable increased muscle length           PT Education - 05/01/18 1502    Education Details  shoulder shrugs, cervical and  scapular retraction, upper trap and levator stretches    Person(s) Educated  Patient    Methods  Explanation    Comprehension  Verbalized understanding;Returned demonstration       PT Short Term Goals - 05/01/18 1517      PT SHORT TERM GOAL #1   Title  independent with initial HEP    Time  2    Period  Weeks    Status  New        PT Long Term Goals - 05/01/18 1517      PT LONG TERM GOAL #1   Title  report HA frequency decreased 50%    Time  8    Period  Weeks    Status  New      PT LONG TERM GOAL #2   Title  report HA pain decreased 50%    Time  8    Period  Weeks    Status  New      PT LONG TERM GOAL #3   Title  increase cervical ROM 25%    Time  8    Period  Weeks    Status  New      PT LONG TERM GOAL #4   Title  understand proper posture and body mechanics    Time  8    Period  Weeks    Status  New             Plan - 05/01/18 1514    Clinical Impression Statement  Patient reports that she has had tension HA's for a long time but reports that it is much worse over the past few years, she reports when she is working she has a HA daily.  She has significant tightness and spasms in the right upper trap that does send some pain to the right occipital area.  Mild forward head and shoulders.  Some scapular weakness    Clinical Presentation  Stable    Clinical Decision Making  Low    Rehab Potential  Good    PT  Frequency  1x / week    PT Duration  8 weeks    PT Treatment/Interventions  ADLs/Self Care Home Management;Cryotherapy;Electrical Stimulation;Moist Heat;Traction;Ultrasound;Therapeutic activities;Therapeutic exercise;Patient/family education;Manual techniques;Dry needling    PT Next Visit Plan  start gym exercises, could try STM as she was apprehensive about the dry needles    Consulted and Agree with Plan of Care  Patient       Patient will benefit from skilled therapeutic intervention in order to improve the following deficits and impairments:   Decreased range of motion, Increased muscle spasms, Pain, Improper body mechanics, Postural dysfunction  Visit Diagnosis: Acute intractable tension-type headache - Plan: PT plan of care cert/re-cert  Cramp and spasm - Plan: PT plan of care cert/re-cert     Problem List There are no active problems to display for this patient.   Sumner Boast., PT 05/01/2018, 5:25 PM  [place] [address] [place], [place], [address] Phone: [phone]   Fax:  [phone]  Name: JACQULYN BARRESI MRN: [id number] Date of Birth: 22-Sep-1961

## 2018-05-01 NOTE — Patient Instructions (Signed)

## 2018-05-13 ENCOUNTER — Ambulatory Visit: Payer: Managed Care, Other (non HMO) | Attending: Family Medicine | Admitting: Physical Therapy

## 2018-05-13 ENCOUNTER — Encounter: Payer: Self-pay | Admitting: Physical Therapy

## 2018-05-13 DIAGNOSIS — G44201 Tension-type headache, unspecified, intractable: Secondary | ICD-10-CM | POA: Insufficient documentation

## 2018-05-13 DIAGNOSIS — R252 Cramp and spasm: Secondary | ICD-10-CM | POA: Insufficient documentation

## 2018-05-13 NOTE — Therapy (Signed)
[place], [place], [address] Phone: [phone]   Fax:  [phone]  Physical Therapy Treatment  Patient Details  Name: Tamara Lutz MRN: [id number] Date of Birth: 10/19/1960 Referring Provider: Nolon Rod   Encounter Date: 05/13/2018  PT End of Session - 05/13/18 0923    Visit Number  2    Date for PT Re-Evaluation  07/02/18    PT Start Time  0845    PT Stop Time  0935    PT Time Calculation (min)  50 min    Activity Tolerance  Patient tolerated treatment well    Behavior During Therapy  [facility] for tasks assessed/performed       Past Medical History:  Diagnosis Date  . Allergy   . Hyperlipidemia   . Osteopenia     Past Surgical History:  Procedure Laterality Date  . CESAREAN SECTION    . DILATION AND CURETTAGE OF UTERUS    . MOUTH SURGERY      There were no vitals filed for this visit.  Subjective Assessment - 05/13/18 0850    Subjective  Patient reports that she was very sore after the last treatment but then it went away and she reports feeling a little bit better since, reports that she has had less HA    Currently in Pain?  Yes    Pain Score  1     Pain Location  Head    Pain Orientation  Right    Pain Descriptors / Indicators  Aching                       [facility] Adult PT Treatment/Exercise - 05/13/18 0001      Exercises   Exercises  Neck      Neck Exercises: Machines for Strengthening   UBE (Upper Arm Bike)  level 4 x 4 minutes    Cybex Row  15# 2x10    Lat Pull  20# 2x10      Neck Exercises: Standing   Other Standing Exercises  weighted ball overhead lift back to wall    Other Standing Exercises  shrugs with upper trap and levator stretches 5#      Neck Exercises: Seated   W Back  20 reps      Neck Exercises: Supine   Neck Retraction  20 reps      Moist Heat Therapy   Number Minutes Moist Heat  15 Minutes    Moist Heat Location  Cervical       Electrical Stimulation   Electrical Stimulation Location  right upper trap area    Electrical Stimulation Action  IFC    Electrical Stimulation Parameters  supine    Electrical Stimulation Goals  Pain      Manual Therapy   Manual Therapy  Soft tissue mobilization    Soft tissue mobilization  right upper trap and into the cervical area, gentle stretches of the cervical and upper trap area               PT Short Term Goals - 05/13/18 0924      PT SHORT TERM GOAL #1   Title  independent with initial HEP    Status  Achieved        PT Long Term Goals - 05/01/18 1517      PT LONG TERM GOAL #1   Title  report HA frequency decreased 50%  Time  8    Period  Weeks    Status  New      PT LONG TERM GOAL #2   Title  report HA pain decreased 50%    Time  8    Period  Weeks    Status  New      PT LONG TERM GOAL #3   Title  increase cervical ROM 25%    Time  8    Period  Weeks    Status  New      PT LONG TERM GOAL #4   Title  understand proper posture and body mechanics    Time  8    Period  Weeks    Status  New            Plan - 05/13/18 6381    Clinical Impression Statement  Patient is reporting a little less HA symptoms, she still has signifcant knots in the right upper trap.  Did well with exercises requiring some cues to not elevate the shoulders    PT Next Visit Plan  talk about ergonomics    Consulted and Agree with Plan of Care  Patient       Patient will benefit from skilled therapeutic intervention in order to improve the following deficits and impairments:  Decreased range of motion, Increased muscle spasms, Pain, Improper body mechanics, Postural dysfunction  Visit Diagnosis: Acute intractable tension-type headache  Cramp and spasm     Problem List There are no active problems to display for this patient.   Sumner Boast., PT 05/13/2018, 9:24 AM  [place] [address] [facility] [place], [place], [address] Phone: [phone]   Fax:  [phone]  Name: Tamara Lutz MRN: [id number] Date of Birth: 05-28-1961

## 2018-05-19 ENCOUNTER — Encounter: Payer: Self-pay | Admitting: Gastroenterology

## 2018-05-27 ENCOUNTER — Encounter: Payer: Self-pay | Admitting: Physical Therapy

## 2018-05-27 ENCOUNTER — Ambulatory Visit: Payer: Managed Care, Other (non HMO) | Admitting: Physical Therapy

## 2018-05-27 DIAGNOSIS — G44201 Tension-type headache, unspecified, intractable: Secondary | ICD-10-CM | POA: Diagnosis not present

## 2018-05-27 DIAGNOSIS — R252 Cramp and spasm: Secondary | ICD-10-CM

## 2018-05-27 NOTE — Therapy (Signed)
[place], [place], [address] Phone: [phone]   Fax:  [phone]  Physical Therapy Treatment  Patient Details  Name: Tamara Lutz MRN: [id number] Date of Birth: 1961-10-08 Referring Provider: Nolon Rod   Encounter Date: 05/27/2018  PT End of Session - 05/27/18 1441    Visit Number  3    Date for PT Re-Evaluation  07/02/18    PT Start Time  1400    PT Stop Time  1455    PT Time Calculation (min)  55 min    Activity Tolerance  Patient tolerated treatment well       Past Medical History:  Diagnosis Date  . Allergy   . Hyperlipidemia   . Osteopenia     Past Surgical History:  Procedure Laterality Date  . CESAREAN SECTION    . DILATION AND CURETTAGE OF UTERUS    . MOUTH SURGERY      There were no vitals filed for this visit.  Subjective Assessment - 05/27/18 1359    Subjective  "Not too bad, had to take some pain pills" "More headaches than after the first time I was here"    Currently in Pain?  Yes    Pain Score  4     Pain Location  Shoulder    Pain Orientation  Right                       [facility] Adult PT Treatment/Exercise - 05/27/18 0001      Exercises   Exercises  Neck      Neck Exercises: Machines for Strengthening   UBE (Upper Arm Bike)  level 4 x 6 minutes    Cybex Row  15# 2x10    Lat Pull  20# 2x10      Neck Exercises: Standing   Wall Push Ups  20 reps    Upper Extremity Flexion with Stabilization  Flexion;20 reps    Other Standing Exercises  weighted ball overhead lift back to wall      Neck Exercises: Seated   W Back  20 reps      Neck Exercises: Supine   Neck Retraction  3 secs;10 reps   3 way      Modalities   Modalities  Electrical Stimulation;Moist Heat      Moist Heat Therapy   Number Minutes Moist Heat  15 Minutes    Moist Heat Location  Cervical      Electrical Stimulation   Electrical Stimulation Location  right upper  trap area    Electrical Stimulation Action  IFC    Electrical Stimulation Parameters  supine    Electrical Stimulation Goals  Pain      Manual Therapy   Manual Therapy  Soft tissue mobilization;Passive ROM;Manual Traction    Soft tissue mobilization  right upper trap and into the cervical area, gentle stretches of the cervical and upper trap area; Passive release uper R trap    Passive ROM  cervical spine all directiions    Manual Traction  cervical spine 4x10''             PT Education - 05/27/18 1445    Education Details  ergonomics and posture    Person(s) Educated  Patient    Methods  Explanation;Demonstration    Comprehension  Verbalized understanding       PT Short Term Goals - 05/13/18 0924      PT  SHORT TERM GOAL #1   Title  independent with initial HEP    Status  Achieved        PT Long Term Goals - 05/27/18 1449      PT LONG TERM GOAL #1   Title  report HA frequency decreased 50%    Status  Partially Met      PT LONG TERM GOAL #2   Title  report HA pain decreased 50%    Status  Partially Met      PT LONG TERM GOAL #4   Title  understand proper posture and body mechanics    Status  Partially Met            Plan - 05/27/18 1443    Clinical Impression Statement  Pt did well with today's exercises, did require que's not to elevate shoulder with seated rows. Goo ROM with W against the wall. Full passive cervical ROM noted during MT. Positive response to MT, reporting decrease pain and increase soft tissue elasticity     Rehab Potential  Good    PT Frequency  1x / week    PT Duration  8 weeks    PT Treatment/Interventions  ADLs/Self Care Home Management;Cryotherapy;Electrical Stimulation;Moist Heat;Traction;Ultrasound;Therapeutic activities;Therapeutic exercise;Patient/family education;Manual techniques;Dry needling    PT Next Visit Plan  postural strengthening        Patient will benefit from skilled therapeutic intervention in order to improve  the following deficits and impairments:  Decreased range of motion, Increased muscle spasms, Pain, Improper body mechanics, Postural dysfunction  Visit Diagnosis: Acute intractable tension-type headache  Cramp and spasm     Problem List There are no active problems to display for this patient.   Scot Jun, PTA 05/27/2018, 2:50 PM  [place] [address] [place], [place], [address] Phone: [phone]   Fax:  [phone]  Name: Tamara Lutz MRN: [id number] Date of Birth: [birth year]

## 2018-05-28 ENCOUNTER — Encounter: Payer: Self-pay | Admitting: Gastroenterology

## 2018-05-28 ENCOUNTER — Ambulatory Visit (AMBULATORY_SURGERY_CENTER): Payer: Managed Care, Other (non HMO) | Admitting: Gastroenterology

## 2018-05-28 VITALS — BP 96/52 | HR 58 | Temp 99.3°F | Resp 14 | Ht 64.0 in | Wt 144.0 lb

## 2018-05-28 DIAGNOSIS — D125 Benign neoplasm of sigmoid colon: Secondary | ICD-10-CM

## 2018-05-28 DIAGNOSIS — Z1211 Encounter for screening for malignant neoplasm of colon: Secondary | ICD-10-CM

## 2018-05-28 MED ORDER — SODIUM CHLORIDE 0.9 % IV SOLN: 500.0000 mL | Freq: Once | INTRAVENOUS | Status: DC

## 2018-05-28 NOTE — Progress Notes (Signed)
Pt's states no medical or surgical changes since previsit or office visit. 

## 2018-05-28 NOTE — Op Note (Signed)
[place] Patient Name: Tamara Lutz Procedure Date: 05/28/2018 8:56 AM MRN: [id number] Endoscopist: Jackquline Denmark , MD Age: 57 Referring MD:  Date of Birth: June 21, 1961 Gender: Female Account #: [id number] Procedure:                Colonoscopy Indications:              Screening for colorectal malignant neoplasm Medicines:                Monitored Anesthesia Care Procedure:                Pre-Anesthesia Assessment:                           - Prior to the procedure, a History and Physical                            was performed, and patient medications and                            allergies were reviewed. The patient's tolerance of                            previous anesthesia was also reviewed. The risks                            and benefits of the procedure and the sedation                            options and risks were discussed with the patient.                            All questions were answered, and informed consent                            was obtained. Prior Anticoagulants: The patient has                            taken no previous anticoagulant or antiplatelet                            agents. ASA Grade Assessment: I - A normal, healthy                            patient. After reviewing the risks and benefits,                            the patient was deemed in satisfactory condition to                            undergo the procedure.                           After obtaining informed consent, the colonoscope  was passed under direct vision. Throughout the                            procedure, the patient's blood pressure, pulse, and                            oxygen saturations were monitored continuously. The                            Colonoscope was introduced through the anus and                            advanced to the the cecum, identified by                            appendiceal orifice and ileocecal valve.  The                            colonoscopy was performed without difficulty. The                            patient tolerated the procedure well. The quality                            of the bowel preparation was excellent. Scope In: 9:06:48 AM Scope Out: 9:29:55 AM Scope Withdrawal Time: 0 hours 13 minutes 28 seconds  Total Procedure Duration: 0 hours 23 minutes 7 seconds  Findings:                 Two sessile polyps were found in the distal sigmoid                            colon 25 cm from the anal verge. The polyps were 10                            mm in size. These polyps were removed with a hot                            snare. Resection and retrieval were complete.                            Estimated blood loss: none.                           A few small-mouthed diverticula were found in the                            sigmoid colon.                           Non-bleeding internal hemorrhoids were found during                            retroflexion. The hemorrhoids were small.  The entire examined colon appeared normal on direct                            and retroflexion views. Complications:            No immediate complications. Estimated Blood Loss:     Estimated blood loss: none. Impression:               - Colonic polyps status post polypectomy.                           - Mild sigmoid diverticulosis                           - Small non-bleeding internal hemorrhoids. Recommendation:           - Patient has a contact number available for                            emergencies. The signs and symptoms of potential                            delayed complications were discussed with the                            patient. Return to normal activities tomorrow.                            Written discharge instructions were provided to the                            patient.                           - Resume previous diet.                            - No aspirin, ibuprofen, naproxen, or other                            non-steroidal anti-inflammatory drugs for 5 days                            after polyp removal.                           - Await pathology results.                           - Repeat colonoscopy for surveillance based on                            pathology results.                           - Return to GI clinic PRN. Jackquline Denmark, MD 05/28/2018 9:36:17 AM This report has been signed electronically.

## 2018-05-28 NOTE — Progress Notes (Signed)
Called to room to assist during endoscopic procedure.  Patient ID and intended procedure confirmed with present staff. Received instructions for my participation in the procedure from the performing physician.  

## 2018-05-28 NOTE — Patient Instructions (Signed)
YOU HAD AN ENDOSCOPIC PROCEDURE TODAY AT [facility]:   Refer to the procedure report that was given to you for any specific questions about what was found during the examination.  If the procedure report does not answer your questions, please call your gastroenterologist to clarify.  If you requested that your care partner not be given the details of your procedure findings, then the procedure report has been included in a sealed envelope for you to review at your convenience later.  YOU SHOULD EXPECT: Some feelings of bloating in the abdomen. Passage of more gas than usual.  Walking can help get rid of the air that was put into your GI tract during the procedure and reduce the bloating. If you had a lower endoscopy (such as a colonoscopy or flexible sigmoidoscopy) you may notice spotting of blood in your stool or on the toilet paper. If you underwent a bowel prep for your procedure, you may not have a normal bowel movement for a few days.  Please Note:  You might notice some irritation and congestion in your nose or some drainage.  This is from the oxygen used during your procedure.  There is no need for concern and it should clear up in a day or so.  SYMPTOMS TO REPORT IMMEDIATELY:   Following lower endoscopy (colonoscopy or flexible sigmoidoscopy):  Excessive amounts of blood in the stool  Significant tenderness or worsening of abdominal pains  Swelling of the abdomen that is new, acute  Fever of 100F or higher  For urgent or emergent issues, a gastroenterologist can be reached at any hour by calling [phone].   DIET:  We do recommend a small meal at first, but then you may proceed to your regular diet.  Drink plenty of fluids but you should avoid alcoholic beverages for 24 hours.  MEDICATIONS: No Aspirin, Ibuprofen, Naproxen, or other non-steroidal anti-inflammatory drugs for 5 days after polyp removal.  Please see handouts given to you by your recovery  nurse.  ACTIVITY:  You should plan to take it easy for the rest of today and you should NOT DRIVE or use heavy machinery until tomorrow (because of the sedation medicines used during the test).    FOLLOW UP: Our staff will call the number listed on your records the next business day following your procedure to check on you and address any questions or concerns that you may have regarding the information given to you following your procedure. If we do not reach you, we will leave a message.  However, if you are feeling well and you are not experiencing any problems, there is no need to return our call.  We will assume that you have returned to your regular daily activities without incident.  If any biopsies were taken you will be contacted by phone or by letter within the next 1-3 weeks.  Please call us at [phone] if you have not heard about the biopsies in 3 weeks.   Thank you for allowing [place] to provide for your healthcare needs today.   SIGNATURES/CONFIDENTIALITY: You and/or your care partner have signed paperwork which will be entered into your electronic medical record.  These signatures attest to the fact that that the information above on your After Visit Summary has been reviewed and is understood.  Full responsibility of the confidentiality of this discharge information lies with you and/or your care-partner.

## 2018-05-28 NOTE — Progress Notes (Signed)
To PACU, VSS. Report to [email or web address] 

## 2018-05-29 ENCOUNTER — Telehealth: Payer: Self-pay

## 2018-05-29 NOTE — Telephone Encounter (Signed)
  Follow up Call-  Call back number 05/28/2018  Post procedure Call Back phone  # [phone]  Permission to leave phone message Yes  Some recent data might be hidden     Patient questions:  Do you have a fever, pain , or abdominal swelling? No. Pain Score  0 *  Have you tolerated food without any problems? Yes.    Have you been able to return to your normal activities? Yes.    Do you have any questions about your discharge instructions: Diet   No. Medications  No. Follow up visit  No.  Do you have questions or concerns about your Care? No.  Actions: * If pain score is 4 or above: No action needed, pain <4.

## 2018-06-10 ENCOUNTER — Encounter: Payer: Self-pay | Admitting: Physical Therapy

## 2018-06-10 ENCOUNTER — Ambulatory Visit: Payer: Managed Care, Other (non HMO) | Attending: Family Medicine | Admitting: Physical Therapy

## 2018-06-10 ENCOUNTER — Encounter: Payer: Self-pay | Admitting: Family Medicine

## 2018-06-10 DIAGNOSIS — R252 Cramp and spasm: Secondary | ICD-10-CM | POA: Diagnosis present

## 2018-06-10 DIAGNOSIS — G44201 Tension-type headache, unspecified, intractable: Secondary | ICD-10-CM | POA: Diagnosis not present

## 2018-06-10 NOTE — Therapy (Signed)
[place], [place], [address] Phone: [phone]   Fax:  [phone]  Physical Therapy Treatment  Patient Details  Name: Tamara Lutz MRN: [id number] Date of Birth: [birth year] Referring Provider: Nolon Rod   Encounter Date: 06/10/2018  PT End of Session - 06/10/18 1349    Visit Number  4    Date for PT Re-Evaluation  07/02/18    PT Start Time  1300    PT Stop Time  1401    PT Time Calculation (min)  61 min    Activity Tolerance  Patient tolerated treatment well    Behavior During Therapy  [facility] for tasks assessed/performed       Past Medical History:  Diagnosis Date  . Allergy   . Hyperlipidemia   . Osteopenia     Past Surgical History:  Procedure Laterality Date  . CESAREAN SECTION    . DILATION AND CURETTAGE OF UTERUS    . MOUTH SURGERY      There were no vitals filed for this visit.  Subjective Assessment - 06/10/18 1259    Subjective  "Its been good" "I did have a headache today so I took a pill, but it is the first day of school"    Patient Stated Goals  have less HA    Currently in Pain?  Yes    Pain Score  3     Pain Location  --   neck and back of head                      [facility] Adult PT Treatment/Exercise - 06/10/18 0001      Exercises   Exercises  Neck      Neck Exercises: Machines for Strengthening   UBE (Upper Arm Bike)  level 4 x 6 minutes    Cybex Row  15# 2x15    Lat Pull  20# 2x10      Neck Exercises: Standing   Wall Push Ups  20 reps    Other Standing Exercises  weighted ball overhead lift back to wall    Other Standing Exercises  shrugs with upper trap and levator stretches 5#      Modalities   Modalities  Electrical Stimulation;Moist Heat      Moist Heat Therapy   Number Minutes Moist Heat  15 Minutes    Moist Heat Location  Cervical      Electrical Stimulation   Electrical Stimulation Location  right upper trap area    Electrical Stimulation Action  IFC    Electrical Stimulation Parameters  supine    Electrical Stimulation Goals  Pain      Manual Therapy   Manual Therapy  Soft tissue mobilization;Passive ROM;Manual Traction    Soft tissue mobilization  right upper trap and into the cervical area, gentle stretches of the cervical and upper trap area; Passive release uper R trap    Passive ROM  cervical spine all directiions    Manual Traction  cervical spine 5x10''       Trigger Point Dry Needling - 06/10/18 1352    Muscles Treated Upper Body  Upper trapezius    Upper Trapezius Response  Twitch reponse elicited;Palpable increased muscle length             PT Short Term Goals - 05/13/18 4010      PT SHORT TERM GOAL #1   Title  independent with initial HEP  Status  Achieved        PT Long Term Goals - 05/27/18 1449      PT LONG TERM GOAL #1   Title  report HA frequency decreased 50%    Status  Partially Met      PT LONG TERM GOAL #2   Title  report HA pain decreased 50%    Status  Partially Met      PT LONG TERM GOAL #4   Title  understand proper posture and body mechanics    Status  Partially Met            Plan - 06/10/18 1351    Clinical Impression Statement  Pt continues to do well with all exercises. She demos good strength and ROM with all activities. She reports movement overall, did have a headache today but that went away after MT.    Rehab Potential  Good    PT Treatment/Interventions  ADLs/Self Care Home Management;Cryotherapy;Electrical Stimulation;Moist Heat;Traction;Ultrasound;Therapeutic activities;Therapeutic exercise;Patient/family education;Manual techniques;Dry needling    PT Next Visit Plan  postural strengthening        Patient will benefit from skilled therapeutic intervention in order to improve the following deficits and impairments:  Decreased range of motion, Increased muscle spasms, Pain, Improper body mechanics, Postural dysfunction  Visit  Diagnosis: Acute intractable tension-type headache  Cramp and spasm     Problem List There are no active problems to display for this patient.   Scot Jun 06/10/2018, 1:52 PM  [address] [place], [place], [address] Phone: [phone]   Fax:  [phone]  Name: Tamara Lutz MRN: [id number] Date of Birth: 1961/09/23

## 2018-06-12 ENCOUNTER — Encounter: Payer: Self-pay | Admitting: Gastroenterology

## 2018-06-19 ENCOUNTER — Encounter: Payer: Self-pay | Admitting: Family Medicine

## 2018-06-19 ENCOUNTER — Ambulatory Visit (INDEPENDENT_AMBULATORY_CARE_PROVIDER_SITE_OTHER): Payer: Managed Care, Other (non HMO) | Admitting: Family Medicine

## 2018-06-19 ENCOUNTER — Other Ambulatory Visit: Payer: Self-pay

## 2018-06-19 VITALS — BP 122/80 | HR 73 | Temp 99.2°F | Resp 17 | Ht 64.0 in | Wt 138.8 lb

## 2018-06-19 DIAGNOSIS — B078 Other viral warts: Secondary | ICD-10-CM

## 2018-06-19 DIAGNOSIS — E785 Hyperlipidemia, unspecified: Secondary | ICD-10-CM

## 2018-06-19 NOTE — Patient Instructions (Signed)
Cryosurgery for Skin Conditions Cryosurgery, also called cryotherapy, is the use of extreme cold to freeze and remove abnormal or diseased tissue. Growths on the skin such as warts, precancerous skin lesions (actinic keratoses), and some skin cancers may be removed with cryosurgery. Cryosurgery usually takes a few minutes, and it can be done in your health care provider's office. Tell a health care provider about:  Any allergies you have.  All medicines you are taking, including vitamins, herbs, eye drops, creams, and over-the-counter medicines.  Any problems you or family members have had with anesthetic medicines.  Any blood disorders you have.  Any surgeries you have had.  Any medical conditions you have. What are the risks? Generally, this is a safe procedure. However, problems may occur, including:  Bleeding.  Scarring.  Changes in skin color (lighter or darker than normal skin tone).  Swelling.  Hair loss in the treated area.  Nerve damage and loss of feeling (rare).  What happens before the procedure? No specific preparation is necessary for this procedure. What happens during the procedure?  Your procedure will be performed using one of the following methods: ? Your health care provider may apply a device (probe) to the skin. The probe has liquid nitrogen flowing through it to cool it down. The probe will be applied to the skin until the skin is frozen and destroyed. ? Your health care provider may apply liquid nitrogen to the skin with a swab or by spraying it until the skin is frozen and destroyed.  The treated area may be covered with a bandage (dressing). These procedures may vary among health care providers and clinics. What happens after the procedure?  Shortly after the procedure, the treated area will become red and swollen. A blister may form. Summary  Cryosurgery, also called cryotherapy, is the use of extreme cold to freeze and remove abnormal or  diseased tissue.  Cryosurgery usually takes a few minutes, and it can be done in your health care provider's office.  There are two different methods for performing cryosurgery. One method involves using a probe to freeze the growth, and the other method involves applying liquid nitrogen directly to the growth. This information is not intended to replace advice given to you by your health care provider. Make sure you discuss any questions you have with your health care provider. Document Released: 09/21/2000 Document Revised: 08/13/2016 Document Reviewed: 08/13/2016 Elsevier Interactive Patient Education  2017 [employer].

## 2018-06-19 NOTE — Progress Notes (Signed)
Chief Complaint  Patient presents with  . warts frozen off  . Medication Refill    cetirizine, flonase, montelukast    HPI   Pt reports that her wart from July that was frozen has returned She would like to get the 2 warts removed.  She also started her cholesterol medication lipitor  She is tolerating it well She wanted to know when to recheck her labs Lab Results  Component Value Date   CHOL 273 (H) 04/14/2018   Lab Results  Component Value Date   HDL 55 04/14/2018   Lab Results  Component Value Date   LDLCALC 198 (H) 04/14/2018   Lab Results  Component Value Date   TRIG 99 04/14/2018   Lab Results  Component Value Date   CHOLHDL 5.0 (H) 04/14/2018   No results found for: LDLDIRECT    Past Medical History:  Diagnosis Date  . Allergy   . Hyperlipidemia   . Osteopenia     Current Outpatient Medications  Medication Sig Dispense Refill  . atorvastatin (LIPITOR) 10 MG tablet Take 1 tablet (10 mg total) by mouth daily at 6 PM. 90 tablet 0  . cetirizine (ZYRTEC) 10 MG chewable tablet Chew 10 mg by mouth daily.    . diphenhydrAMINE (BENADRYL) 25 mg capsule Take 25 mg by mouth every 6 (six) hours as needed.    . fluticasone (FLONASE) 50 MCG/ACT nasal spray Place 1 spray 2 (two) times daily into both nostrils. 48 g 3  . montelukast (SINGULAIR) 10 MG tablet Take 1 tablet (10 mg total) at bedtime by mouth. 90 tablet 3  . Multiple Vitamin (MULTIVITAMIN) tablet Take 1 tablet by mouth daily.    Marland Kitchen OVER THE COUNTER MEDICATION Vitamin D 3 400 units, one capsule daily.    Marland Kitchen OVER THE COUNTER MEDICATION Calcium 600 mg one capsule daily.    Marland Kitchen OVER THE COUNTER MEDICATION Apple Cider Vinegar, 450 mg one daily.    . SUMAtriptan (IMITREX) 5 MG/ACT nasal spray Place 1 spray (5 mg total) into the nose every 2 (two) hours as needed for migraine. 30 Inhaler 1  . triamcinolone cream (KENALOG) 0.1 % Apply 1 application 2 (two) times daily topically. (Patient not taking: Reported on  06/19/2018) 15 g 1   Current Facility-Administered Medications  Medication Dose Route Frequency Provider Last Rate Last Dose  . 0.9 %  sodium chloride infusion  500 mL Intravenous Once Jackquline Denmark, MD        Allergies: No Known Allergies  Past Surgical History:  Procedure Laterality Date  . CESAREAN SECTION    . DILATION AND CURETTAGE OF UTERUS    . MOUTH SURGERY      Social History   Socioeconomic History  . Marital status: Married    Spouse name: Not on file  . Number of children: Not on file  . Years of education: Not on file  . Highest education level: Not on file  Occupational History  . Not on file  Social Needs  . Financial resource strain: Not on file  . Food insecurity:    Worry: Not on file    Inability: Not on file  . Transportation needs:    Medical: Not on file    Non-medical: Not on file  Tobacco Use  . Smoking status: Never Smoker  . Smokeless tobacco: Never Used  Substance and Sexual Activity  . Alcohol use: No  . Drug use: No  . Sexual activity: Yes  Lifestyle  . Physical activity:  Days per week: Not on file    Minutes per session: Not on file  . Stress: Not on file  Relationships  . Social connections:    Talks on phone: Not on file    Gets together: Not on file    Attends religious service: Not on file    Active member of club or organization: Not on file    Attends meetings of clubs or organizations: Not on file    Relationship status: Not on file  Other Topics Concern  . Not on file  Social History Narrative  . Not on file    Family History  Problem Relation Age of Onset  . Multiple sclerosis Mother   . Uterine cancer Mother   . Alzheimer's disease Father   . Breast cancer Sister   . Diabetes Neg Hx   . Heart disease Neg Hx   . Hypertension Neg Hx   . Colon cancer Neg Hx   . Esophageal cancer Neg Hx   . Rectal cancer Neg Hx   . Stomach cancer Neg Hx      ROS Review of Systems See HPI Constitution: No fevers or  chills No malaise No diaphoresis Skin: No rash or itching Eyes: no blurry vision, no double vision GU: no dysuria or hematuria Neuro: no dizziness or headaches all others reviewed and negative   Objective: Vitals:   06/19/18 1327  BP: 122/80  Pulse: 73  Resp: 17  Temp: 99.2 F (37.3 C)  TempSrc: Oral  SpO2: 98%  Weight: 138 lb 12.8 oz (63 kg)  Height: 5\' 4"  (1.626 m)    Physical Exam  Constitutional: She appears well-developed and well-nourished.  HENT:  Head: Normocephalic and atraumatic.  Eyes: Conjunctivae and EOM are normal.  Pulmonary/Chest: Effort normal.  2 warts on the fingers - one on the index and one on the middle finger  Assessment and Plan Tamara Lutz was seen today for warts frozen off and medication refill.  Diagnoses and all orders for this visit:  Other viral warts -  Debrided with scapel then did cryotherapy with liquid nitrogen  Dyslipidemia- pt to get her levels rechecked 3 months after her last lab in July -     Lipid panel; Future -     Comprehensive metabolic panel; Future     Rahma Meller A Nolon Rod

## 2018-06-24 ENCOUNTER — Encounter: Payer: Self-pay | Admitting: Physical Therapy

## 2018-06-24 ENCOUNTER — Ambulatory Visit: Payer: Managed Care, Other (non HMO) | Admitting: Physical Therapy

## 2018-06-24 DIAGNOSIS — G44201 Tension-type headache, unspecified, intractable: Secondary | ICD-10-CM

## 2018-06-24 DIAGNOSIS — R252 Cramp and spasm: Secondary | ICD-10-CM

## 2018-06-24 NOTE — Therapy (Signed)
[place] [address] [place], [place], [address] Phone: [phone]   Fax:  [phone]  Physical Therapy Treatment  Patient Details  Name: Tamara Lutz MRN: [id number] Date of Birth: Sep 11, 1961 Referring Provider: Nolon Rod   Encounter Date: 06/24/2018  PT End of Session - 06/24/18 1515    Visit Number  5    Date for PT Re-Evaluation  07/02/18    PT Start Time  1430    PT Stop Time  1515    PT Time Calculation (min)  45 min    Activity Tolerance  Patient tolerated treatment well    Behavior During Therapy  [facility] for tasks assessed/performed       Past Medical History:  Diagnosis Date  . Allergy   . Hyperlipidemia   . Osteopenia     Past Surgical History:  Procedure Laterality Date  . CESAREAN SECTION    . DILATION AND CURETTAGE OF UTERUS    . MOUTH SURGERY      There were no vitals filed for this visit.  Subjective Assessment - 06/24/18 1433    Subjective  "About the same, having to take a pain pill not everyday but almost" Have noticed some improvement since starting therapy,    Currently in Pain?  Yes    Pain Score  3     Pain Location  --   head   Pain Descriptors / Indicators  Aching         [facility] PT Assessment - 06/24/18 0001      AROM   Overall AROM Comments  Cervical ROM WFL some pain with side bending                   [facility] Adult PT Treatment/Exercise - 06/24/18 0001      Neck Exercises: Machines for Strengthening   UBE (Upper Arm Bike)  level 4 x 6 minutes      Neck Exercises: Standing   Other Standing Exercises  shrugs with upper trap and levator stretches 5#      Modalities   Modalities  Ultrasound      Ultrasound   Ultrasound Location  [place]    Ultrasound Parameters  1MHz 1.2w/cm2    Ultrasound Goals  Pain   decrease tightness     Manual Therapy   Manual Therapy  Soft tissue mobilization;Passive ROM;Manual Traction    Soft tissue mobilization   right upper trap and into the cervical area, gentle stretches of the cervical and upper trap area; Passive release uper R trap    Passive ROM  cervical spine all directiions    Manual Traction  cervical spine 5x10''               PT Short Term Goals - 05/13/18 9798      PT SHORT TERM GOAL #1   Title  independent with initial HEP    Status  Achieved        PT Long Term Goals - 05/27/18 1449      PT LONG TERM GOAL #1   Title  report HA frequency decreased 50%    Status  Partially Met      PT LONG TERM GOAL #2   Title  report HA pain decreased 50%    Status  Partially Met      PT LONG TERM GOAL #4   Title  understand proper posture and body mechanics    Status  Partially  Met            Plan - 06/24/18 1516    Clinical Impression Statement  MT and modalities today, No issues with today's shrugs and levator stretch. Positive response to [place].    Rehab Potential  Good    PT Frequency  1x / week    PT Duration  8 weeks    PT Treatment/Interventions  ADLs/Self Care Home Management;Cryotherapy;Electrical Stimulation;Moist Heat;Traction;Ultrasound;Therapeutic activities;Therapeutic exercise;Patient/family education;Manual techniques;Dry needling    PT Next Visit Plan  assess [place]    PT Home Exercise Plan  Rows, Extensions, ER, Scapular retraction       Patient will benefit from skilled therapeutic intervention in order to improve the following deficits and impairments:  Decreased range of motion, Increased muscle spasms, Pain, Improper body mechanics, Postural dysfunction  Visit Diagnosis: Cramp and spasm  Acute intractable tension-type headache     Problem List There are no active problems to display for this patient.   Scot Jun, PTA 06/24/2018, 3:22 PM  [address] [place], [place], [address] Phone: [phone]   Fax:  [phone]  Name: Tamara Lutz MRN:  [id number] Date of Birth: [birth year]

## 2018-07-08 ENCOUNTER — Encounter: Payer: Self-pay | Admitting: Physical Therapy

## 2018-07-08 ENCOUNTER — Ambulatory Visit: Payer: Managed Care, Other (non HMO) | Attending: Family Medicine | Admitting: Physical Therapy

## 2018-07-08 DIAGNOSIS — G44201 Tension-type headache, unspecified, intractable: Secondary | ICD-10-CM | POA: Diagnosis present

## 2018-07-08 DIAGNOSIS — R252 Cramp and spasm: Secondary | ICD-10-CM | POA: Diagnosis present

## 2018-07-08 NOTE — Therapy (Signed)
[place] [address] [place], [place], [address] Phone: [phone]   Fax:  [phone]  Physical Therapy Treatment  Patient Details  Name: Tamara Lutz MRN: [id number] Date of Birth: Nov 17, 1960 Referring Provider (PT): Nolon Rod   Encounter Date: 07/08/2018  PT End of Session - 07/08/18 1520    Visit Number  6    Date for PT Re-Evaluation  07/02/18    PT Start Time  1430    PT Stop Time  1510    PT Time Calculation (min)  40 min    Activity Tolerance  Patient tolerated treatment well    Behavior During Therapy  [facility] for tasks assessed/performed       Past Medical History:  Diagnosis Date  . Allergy   . Hyperlipidemia   . Osteopenia     Past Surgical History:  Procedure Laterality Date  . CESAREAN SECTION    . DILATION AND CURETTAGE OF UTERUS    . MOUTH SURGERY      There were no vitals filed for this visit.  Subjective Assessment - 07/08/18 1433    Subjective  "I feel great"    Currently in Pain?  No/denies    Pain Score  0-No pain         [facility] PT Assessment - 07/08/18 0001      AROM   Overall AROM Comments  Cervical ROM WFL some pain with side bending                   [facility] Adult PT Treatment/Exercise - 07/08/18 0001      Exercises   Exercises  Neck      Neck Exercises: Machines for Strengthening   UBE (Upper Arm Bike)  level 4 x 6 minutes    Cybex Row  15# 2x15    Lat Pull  20# 2x10      Neck Exercises: Standing   Other Standing Exercises  shrugs with upper trap and levator stretches 5#      Manual Therapy   Manual Therapy  Soft tissue mobilization;Passive ROM;Manual Traction    Soft tissue mobilization  right upper trap and into the cervical area, gentle stretches of the cervical and upper trap area; Passive release uper R trap    Passive ROM  cervical spine all directiions    Manual Traction  cervical spine 5x10''               PT Short Term Goals -  05/13/18 6962      PT SHORT TERM GOAL #1   Title  independent with initial HEP    Status  Achieved        PT Long Term Goals - 07/08/18 1520      PT LONG TERM GOAL #1   Title  report HA frequency decreased 50%    Status  Achieved            Plan - 07/08/18 1521    Clinical Impression Statement  Pt enters clinic reporting that she felt great with no pain and a decrease in HA. No issues with today's interventions.    Rehab Potential  Good    PT Frequency  1x / week    PT Duration  8 weeks    PT Next Visit Plan  D/C if pt is doing well       Patient will benefit from skilled therapeutic intervention in order to improve the following deficits and  impairments:  Decreased range of motion, Increased muscle spasms, Pain, Improper body mechanics, Postural dysfunction  Visit Diagnosis: Cramp and spasm  Acute intractable tension-type headache     Problem List There are no active problems to display for this patient.   Scot Jun, PTA 07/08/2018, 3:23 PM  [place] [address] [place], [place], [address] Phone: [phone]   Fax:  [phone]  Name: Tamara Lutz MRN: [id number] Date of Birth: [birth year]

## 2018-07-22 ENCOUNTER — Ambulatory Visit: Payer: Managed Care, Other (non HMO) | Admitting: Physical Therapy

## 2018-07-22 ENCOUNTER — Ambulatory Visit (INDEPENDENT_AMBULATORY_CARE_PROVIDER_SITE_OTHER): Payer: Managed Care, Other (non HMO) | Admitting: Family Medicine

## 2018-07-22 ENCOUNTER — Encounter: Payer: Self-pay | Admitting: Physical Therapy

## 2018-07-22 DIAGNOSIS — E785 Hyperlipidemia, unspecified: Secondary | ICD-10-CM

## 2018-07-22 DIAGNOSIS — R252 Cramp and spasm: Secondary | ICD-10-CM

## 2018-07-22 DIAGNOSIS — G44201 Tension-type headache, unspecified, intractable: Secondary | ICD-10-CM

## 2018-07-22 LAB — COMPREHENSIVE METABOLIC PANEL
A/G RATIO: 1.6 (ref 1.2–2.2)
ALK PHOS: 90 IU/L (ref 39–117)
ALT: 17 IU/L (ref 0–32)
AST: 20 IU/L (ref 0–40)
Albumin: 4.3 g/dL (ref 3.5–5.5)
BUN/Creatinine Ratio: 23 (ref 9–23)
BUN: 20 mg/dL (ref 6–24)
Bilirubin Total: 0.3 mg/dL (ref 0.0–1.2)
CALCIUM: 9.5 mg/dL (ref 8.7–10.2)
CHLORIDE: 102 mmol/L (ref 96–106)
CO2: 25 mmol/L (ref 20–29)
Creatinine, Ser: 0.88 mg/dL (ref 0.57–1.00)
GFR calc Af Amer: 85 mL/min/{1.73_m2} (ref >59)
GFR calc non Af Amer: 74 mL/min/{1.73_m2} (ref >59)
GLOBULIN, TOTAL: 2.7 g/dL (ref 1.5–4.5)
Glucose: 78 mg/dL (ref 65–99)
POTASSIUM: 4 mmol/L (ref 3.5–5.2)
SODIUM: 142 mmol/L (ref 134–144)
Total Protein: 7 g/dL (ref 6.0–8.5)

## 2018-07-22 LAB — LIPID PANEL
CHOLESTEROL TOTAL: 184 mg/dL (ref 100–199)
Chol/HDL Ratio: 3.4 ratio (ref 0.0–4.4)
HDL: 54 mg/dL (ref >39)
LDL Calculated: 117 mg/dL — ABNORMAL HIGH (ref 0–99)
TRIGLYCERIDES: 63 mg/dL (ref 0–149)
VLDL CHOLESTEROL CAL: 13 mg/dL (ref 5–40)

## 2018-07-22 NOTE — Therapy (Signed)
[place] [address] [place], [place], [address] Phone: [phone]   Fax:  [phone]  Physical Therapy Treatment  Patient Details  Name: Tamara Lutz MRN: [id number] Date of Birth: [birth year] Referring Provider (PT): Nolon Rod   Encounter Date: 07/22/2018  PT End of Session - 07/22/18 0712    Visit Number  7    Date for PT Re-Evaluation  07/02/18    PT Start Time  1430    PT Stop Time  1500    PT Time Calculation (min)  30 min    Activity Tolerance  Patient tolerated treatment well    Behavior During Therapy  [facility] for tasks assessed/performed       Past Medical History:  Diagnosis Date  . Allergy   . Hyperlipidemia   . Osteopenia     Past Surgical History:  Procedure Laterality Date  . CESAREAN SECTION    . DILATION AND CURETTAGE OF UTERUS    . MOUTH SURGERY      There were no vitals filed for this visit.  Subjective Assessment - 07/22/18 1429    Subjective  "I am doing good"    Currently in Pain?  No/denies    Pain Score  0-No pain                       [facility] Adult PT Treatment/Exercise - 07/22/18 0001      Exercises   Exercises  Neck      Neck Exercises: Machines for Strengthening   UBE (Upper Arm Bike)  level 4 x 3 minutes    Nustep  L3 x5 min     Cybex Row  20# 2x10    Lat Pull  20# 2x10      Neck Exercises: Standing   Other Standing Exercises  Shoulder ER, Rows, and ext  red tband 2x10     Other Standing Exercises  shrugs with upper trap and levator stretches 5#      Manual Therapy   Manual Therapy  Soft tissue mobilization;Passive ROM;Manual Traction    Soft tissue mobilization  right upper trap and into the cervical area, gentle stretches of the cervical and upper trap area; Passive release uper R trap    Passive ROM  cervical spine all directiions    Manual Traction  cervical spine 5x10''               PT Short Term Goals - 05/13/18 1975      PT SHORT TERM GOAL #1   Title  independent with initial HEP    Status  Achieved        PT Long Term Goals - 07/22/18 1457      PT LONG TERM GOAL #1   Title  report HA frequency decreased 50%    Status  Achieved      PT LONG TERM GOAL #2   Title  report HA pain decreased 50%    Status  Achieved      PT LONG TERM GOAL #3   Title  increase cervical ROM 25%    Status  New      PT LONG TERM GOAL #4   Title  understand proper posture and body mechanics    Status  Achieved            Plan - 07/22/18 1458    Clinical Impression Statement  All goals met, no issues with today's interventions. Pt  reports raking leaves 2 weeks ago feeling some discomfort in her neck and back area. She states that this went away after a few days.     Rehab Potential  Good    PT Frequency  1x / week    PT Duration  8 weeks    PT Treatment/Interventions  ADLs/Self Care Home Management;Cryotherapy;Electrical Stimulation;Moist Heat;Traction;Ultrasound;Therapeutic activities;Therapeutic exercise;Patient/family education;Manual techniques;Dry needling    PT Next Visit Plan  D/C PT       Patient will benefit from skilled therapeutic intervention in order to improve the following deficits and impairments:  Decreased range of motion, Increased muscle spasms, Pain, Improper body mechanics, Postural dysfunction  Visit Diagnosis: Cramp and spasm  Acute intractable tension-type headache     Problem List There are no active problems to display for this patient.  PHYSICAL THERAPY DISCHARGE SUMMARY  Visits from Start of Care: 7 Plan: Patient agrees to discharge.  Patient goals were met. Patient is being discharged due to meeting the stated rehab goals.  ?????     Scot Jun, PTA 07/22/2018, 3:01 PM  [place] [address] [place], [place], [address] Phone: [phone]   Fax:  [phone]  Name: AJANEE BUREN MRN:  [id number] Date of Birth: 1961-10-01

## 2018-07-22 NOTE — Progress Notes (Signed)
labs

## 2018-07-23 ENCOUNTER — Other Ambulatory Visit: Payer: Self-pay | Admitting: Family Medicine

## 2018-07-24 MED ORDER — ATORVASTATIN CALCIUM 10 MG PO TABS: 10.0000 mg | ORAL_TABLET | Freq: Every day | ORAL | 0 refills | Status: DC

## 2018-08-01 ENCOUNTER — Other Ambulatory Visit: Payer: Self-pay | Admitting: Obstetrics and Gynecology

## 2018-08-01 DIAGNOSIS — Z803 Family history of malignant neoplasm of breast: Secondary | ICD-10-CM

## 2018-08-07 ENCOUNTER — Encounter: Payer: Self-pay | Admitting: Family Medicine

## 2018-08-07 ENCOUNTER — Other Ambulatory Visit: Payer: Self-pay

## 2018-08-07 DIAGNOSIS — B078 Other viral warts: Secondary | ICD-10-CM

## 2018-08-07 MED ORDER — FLUTICASONE PROPIONATE 50 MCG/ACT NA SUSP: 1.0000 | Freq: Two times a day (BID) | NASAL | 3 refills | Status: DC

## 2018-08-21 ENCOUNTER — Other Ambulatory Visit: Payer: Self-pay | Admitting: Family Medicine

## 2018-08-21 DIAGNOSIS — R51 Headache: Principal | ICD-10-CM

## 2018-08-21 DIAGNOSIS — G44229 Chronic tension-type headache, not intractable: Secondary | ICD-10-CM

## 2018-08-21 DIAGNOSIS — R519 Headache, unspecified: Secondary | ICD-10-CM

## 2018-08-21 MED ORDER — SUMATRIPTAN 5 MG/ACT NA SOLN: 1.0000 | NASAL | 3 refills | Status: DC | PRN

## 2018-08-29 ENCOUNTER — Ambulatory Visit: Payer: Managed Care, Other (non HMO) | Admitting: Family Medicine

## 2018-09-01 ENCOUNTER — Ambulatory Visit: Payer: Managed Care, Other (non HMO) | Admitting: Family Medicine

## 2018-09-03 ENCOUNTER — Other Ambulatory Visit: Payer: Self-pay | Admitting: *Deleted

## 2018-09-03 ENCOUNTER — Encounter: Payer: Self-pay | Admitting: Family Medicine

## 2018-09-03 MED ORDER — MONTELUKAST SODIUM 10 MG PO TABS: 10.0000 mg | ORAL_TABLET | Freq: Every day | ORAL | 1 refills | Status: DC

## 2018-11-02 ENCOUNTER — Other Ambulatory Visit: Payer: Self-pay | Admitting: Family Medicine

## 2018-11-05 MED ORDER — ATORVASTATIN CALCIUM 10 MG PO TABS: 10.0000 mg | ORAL_TABLET | Freq: Every day | ORAL | 0 refills | Status: DC

## 2018-11-07 ENCOUNTER — Other Ambulatory Visit: Payer: Self-pay | Admitting: Family Medicine

## 2018-11-07 DIAGNOSIS — G44229 Chronic tension-type headache, not intractable: Secondary | ICD-10-CM

## 2018-11-07 DIAGNOSIS — R519 Headache, unspecified: Secondary | ICD-10-CM

## 2018-11-07 DIAGNOSIS — R51 Headache: Principal | ICD-10-CM

## 2018-11-07 MED ORDER — SUMATRIPTAN 5 MG/ACT NA SOLN: 1.0000 | NASAL | 3 refills | Status: DC | PRN

## 2018-11-15 ENCOUNTER — Encounter: Payer: Self-pay | Admitting: Family Medicine

## 2018-12-09 ENCOUNTER — Other Ambulatory Visit: Payer: Self-pay | Admitting: Family Medicine

## 2018-12-10 MED ORDER — ATORVASTATIN CALCIUM 10 MG PO TABS: 10.0000 mg | ORAL_TABLET | Freq: Every day | ORAL | 0 refills | Status: DC

## 2018-12-16 ENCOUNTER — Encounter: Payer: Self-pay | Admitting: Family Medicine

## 2019-01-09 ENCOUNTER — Other Ambulatory Visit: Payer: Self-pay | Admitting: *Deleted

## 2019-01-09 DIAGNOSIS — Z13220 Encounter for screening for lipoid disorders: Secondary | ICD-10-CM

## 2019-01-16 ENCOUNTER — Other Ambulatory Visit: Payer: Self-pay

## 2019-01-16 ENCOUNTER — Telehealth (INDEPENDENT_AMBULATORY_CARE_PROVIDER_SITE_OTHER): Payer: Managed Care, Other (non HMO) | Admitting: Family Medicine

## 2019-01-16 DIAGNOSIS — J310 Chronic rhinitis: Secondary | ICD-10-CM

## 2019-01-16 DIAGNOSIS — M25561 Pain in right knee: Secondary | ICD-10-CM | POA: Diagnosis not present

## 2019-01-16 DIAGNOSIS — E785 Hyperlipidemia, unspecified: Secondary | ICD-10-CM

## 2019-01-16 MED ORDER — OLOPATADINE HCL 0.2 % OP SOLN: 1.0000 [drp] | Freq: Every day | OPHTHALMIC | 3 refills | Status: DC

## 2019-01-16 NOTE — Patient Instructions (Signed)

## 2019-01-16 NOTE — Progress Notes (Signed)
Telemedicine Encounter- SOAP NOTE Established Patient  This telephone encounter was conducted with the patient's (or proxy's) verbal consent via audio telecommunications: yes/no: Yes Patient was instructed to have this encounter in a suitably private space; and to only have persons present to whom they give permission to participate. In addition, patient identity was confirmed by use of name plus two identifiers (DOB and address).  I discussed the limitations, risks, security and privacy concerns of performing an evaluation and management service by telephone and the availability of in person appointments. I also discussed with the patient that there may be a patient responsible charge related to this service. The patient expressed understanding and agreed to proceed.  I spent a total of TIME; 0 MIN TO 60 MIN: 15 minutes talking with the patient or their proxy.  CC: cholesterol follow up, left knee pain  Subjective   Tamara Lutz is a 58 y.o. established patient. Telephone visit today for  HPI  Dyslipidemia: Patient presents for evaluation of lipids.  Compliance with treatment thus far has been excellent.  A repeat fasting lipid profile was done.  The patient does not use medications that may worsen dyslipidemias (corticosteroids, progestins, anabolic steroids, diuretics, beta-blockers, amiodarone, cyclosporine, olanzapine). The patient exercises weekly.  The patient is not known to have coexisting coronary artery disease.   The 10-year ASCVD risk score Mikey Bussing DC Brooke Bonito., et al., 2013) is: 1.9%   Values used to calculate the score:     Age: 31 years     Sex: Female     Is Non-Hispanic African American: No     Diabetic: No     Tobacco smoker: No     Systolic Blood Pressure: 093 mmHg     Is BP treated: No     HDL Cholesterol: 54 mg/dL     Total Cholesterol: 184 mg/dL   There are no active problems to display for this patient.  Right knee pain Pt reports that she had knee pain   She had a lump   Right eye  Has been itching Her sinuses are less congested    Past Medical History:  Diagnosis Date  . Allergy   . Hyperlipidemia   . Osteopenia     Current Outpatient Medications  Medication Sig Dispense Refill  . atorvastatin (LIPITOR) 10 MG tablet Take 1 tablet (10 mg total) by mouth daily at 6 PM. "Need appointment for more refills" 30 tablet 0  . cetirizine (ZYRTEC) 10 MG chewable tablet Chew 10 mg by mouth daily.    . diphenhydrAMINE (BENADRYL) 25 mg capsule Take 25 mg by mouth every 6 (six) hours as needed.    . fluticasone (FLONASE) 50 MCG/ACT nasal spray Place 1 spray into both nostrils 2 (two) times daily. 48 g 3  . montelukast (SINGULAIR) 10 MG tablet Take 1 tablet (10 mg total) by mouth at bedtime. 90 tablet 1  . Multiple Vitamin (MULTIVITAMIN) tablet Take 1 tablet by mouth daily.    Marland Kitchen OVER THE COUNTER MEDICATION Vitamin D 3 400 units, one capsule daily.    Marland Kitchen OVER THE COUNTER MEDICATION Calcium 600 mg one capsule daily.    . Olopatadine HCl (PATADAY) 0.2 % SOLN Apply 1 drop to eye daily. 2.5 mL 3  . OVER THE COUNTER MEDICATION Apple Cider Vinegar, 450 mg one daily.    . SUMAtriptan (IMITREX) 5 MG/ACT nasal spray Place 1 spray (5 mg total) into the nose every 2 (two) hours as needed for up to 30  days for migraine. 30 Inhaler 3   Current Facility-Administered Medications  Medication Dose Route Frequency Provider Last Rate Last Dose  . 0.9 %  sodium chloride infusion  500 mL Intravenous Once Jackquline Denmark, MD        No Known Allergies  Social History   Socioeconomic History  . Marital status: Married    Spouse name: Not on file  . Number of children: Not on file  . Years of education: Not on file  . Highest education level: Not on file  Occupational History  . Not on file  Social Needs  . Financial resource strain: Not on file  . Food insecurity:    Worry: Not on file    Inability: Not on file  . Transportation needs:    Medical: Not on  file    Non-medical: Not on file  Tobacco Use  . Smoking status: Never Smoker  . Smokeless tobacco: Never Used  Substance and Sexual Activity  . Alcohol use: No  . Drug use: No  . Sexual activity: Yes  Lifestyle  . Physical activity:    Days per week: Not on file    Minutes per session: Not on file  . Stress: Not on file  Relationships  . Social connections:    Talks on phone: Not on file    Gets together: Not on file    Attends religious service: Not on file    Active member of club or organization: Not on file    Attends meetings of clubs or organizations: Not on file    Relationship status: Not on file  . Intimate partner violence:    Fear of current or ex partner: Not on file    Emotionally abused: Not on file    Physically abused: Not on file    Forced sexual activity: Not on file  Other Topics Concern  . Not on file  Social History Narrative  . Not on file    ROS Review of Systems  Constitutional: Negative for activity change, appetite change, chills and fever.  HENT: see hpi Respiratory: Negative for cough, shortness of breath and wheezing.   Gastrointestinal: Negative for diarrhea, nausea and vomiting.  Genitourinary: Negative for difficulty urinating, dysuria, flank pain and hematuria.  Musculoskeletal: see hpi  Neurological: Negative for dizziness, speech difficulty, light-headedness and numbness.  See HPI. All other review of systems negative.   Objective   Vitals as reported by the patient: There were no vitals filed for this visit.  Diagnoses and all orders for this visit:  Dyslipidemia- discussed cholesterol   Chronic rhinitis- advised pt to take pataday  Acute pain of right knee- continue icing and taping  Other orders -     Olopatadine HCl (PATADAY) 0.2 % SOLN; Apply 1 drop to eye daily.     I discussed the assessment and treatment plan with the patient. The patient was provided an opportunity to ask questions and all were answered. The  patient agreed with the plan and demonstrated an understanding of the instructions.   The patient was advised to call back or seek an in-person evaluation if the symptoms worsen or if the condition fails to improve as anticipated.  I provided 15 minutes of non-face-to-face time during this encounter.  Forrest Moron, MD  Primary Care at [facility]

## 2019-01-16 NOTE — Progress Notes (Signed)
CC-6 month f/u for cholesterol to see if can come off the cholesterol medication. Patient stated she did not hear back on what time or date to come in for lab appt. Patient states she can come in  on Monday to do fasting labs.   2nd CC- was she had a knee injury post fall on 11/22/18 lump on left knee but when tried to get in to be seen there was no appt except for the appt she had today. So wanted to see if we can just talk about this as well. The knee is much better.

## 2019-01-19 ENCOUNTER — Ambulatory Visit (INDEPENDENT_AMBULATORY_CARE_PROVIDER_SITE_OTHER): Payer: Managed Care, Other (non HMO) | Admitting: Family Medicine

## 2019-01-19 ENCOUNTER — Other Ambulatory Visit: Payer: Self-pay

## 2019-01-19 DIAGNOSIS — Z13220 Encounter for screening for lipoid disorders: Secondary | ICD-10-CM

## 2019-01-20 ENCOUNTER — Other Ambulatory Visit: Payer: Self-pay | Admitting: Family Medicine

## 2019-01-20 LAB — LIPID PANEL
Chol/HDL Ratio: 3.7 ratio (ref 0.0–4.4)
Cholesterol, Total: 207 mg/dL — ABNORMAL HIGH (ref 100–199)
HDL: 56 mg/dL (ref >39)
LDL Calculated: 132 mg/dL — ABNORMAL HIGH (ref 0–99)
Triglycerides: 94 mg/dL (ref 0–149)
VLDL Cholesterol Cal: 19 mg/dL (ref 5–40)

## 2019-01-20 LAB — COMPREHENSIVE METABOLIC PANEL
ALT: 16 IU/L (ref 0–32)
AST: 22 IU/L (ref 0–40)
Albumin/Globulin Ratio: 1.5 (ref 1.2–2.2)
Albumin: 4.3 g/dL (ref 3.8–4.9)
Alkaline Phosphatase: 97 IU/L (ref 39–117)
BUN/Creatinine Ratio: 21 (ref 9–23)
BUN: 19 mg/dL (ref 6–24)
Bilirubin Total: 0.3 mg/dL (ref 0.0–1.2)
CO2: 23 mmol/L (ref 20–29)
Calcium: 9.9 mg/dL (ref 8.7–10.2)
Chloride: 104 mmol/L (ref 96–106)
Creatinine, Ser: 0.92 mg/dL (ref 0.57–1.00)
GFR calc Af Amer: 80 mL/min/{1.73_m2} (ref >59)
GFR calc non Af Amer: 69 mL/min/{1.73_m2} (ref >59)
Globulin, Total: 2.8 g/dL (ref 1.5–4.5)
Glucose: 74 mg/dL (ref 65–99)
Potassium: 4.2 mmol/L (ref 3.5–5.2)
Sodium: 143 mmol/L (ref 134–144)
Total Protein: 7.1 g/dL (ref 6.0–8.5)

## 2019-01-20 MED ORDER — ATORVASTATIN CALCIUM 10 MG PO TABS: 10.0000 mg | ORAL_TABLET | Freq: Every day | ORAL | 3 refills | Status: DC

## 2019-01-21 ENCOUNTER — Encounter: Payer: Self-pay | Admitting: Family Medicine

## 2019-02-08 ENCOUNTER — Encounter: Payer: Self-pay | Admitting: Family Medicine

## 2019-02-09 ENCOUNTER — Encounter: Payer: Self-pay | Admitting: Family Medicine

## 2019-02-09 ENCOUNTER — Other Ambulatory Visit: Payer: Self-pay | Admitting: *Deleted

## 2019-02-09 MED ORDER — FLUTICASONE PROPIONATE 50 MCG/ACT NA SUSP: 1.0000 | Freq: Two times a day (BID) | NASAL | 3 refills | Status: DC

## 2019-03-05 ENCOUNTER — Encounter: Payer: Self-pay | Admitting: Family Medicine

## 2019-03-09 ENCOUNTER — Other Ambulatory Visit: Payer: Self-pay

## 2019-03-09 DIAGNOSIS — J310 Chronic rhinitis: Secondary | ICD-10-CM

## 2019-03-09 MED ORDER — MONTELUKAST SODIUM 10 MG PO TABS: 10.0000 mg | ORAL_TABLET | Freq: Every day | ORAL | 1 refills | Status: DC

## 2019-05-31 ENCOUNTER — Encounter: Payer: Self-pay | Admitting: Family Medicine

## 2019-06-02 DIAGNOSIS — K589 Irritable bowel syndrome without diarrhea: Secondary | ICD-10-CM | POA: Insufficient documentation

## 2019-06-02 DIAGNOSIS — G43009 Migraine without aura, not intractable, without status migrainosus: Secondary | ICD-10-CM | POA: Insufficient documentation

## 2019-06-02 DIAGNOSIS — M810 Age-related osteoporosis without current pathological fracture: Secondary | ICD-10-CM | POA: Insufficient documentation

## 2019-06-02 DIAGNOSIS — E785 Hyperlipidemia, unspecified: Secondary | ICD-10-CM | POA: Insufficient documentation

## 2019-06-02 DIAGNOSIS — G43909 Migraine, unspecified, not intractable, without status migrainosus: Secondary | ICD-10-CM | POA: Insufficient documentation

## 2019-06-04 ENCOUNTER — Other Ambulatory Visit: Payer: Self-pay | Admitting: Obstetrics and Gynecology

## 2019-06-04 DIAGNOSIS — R928 Other abnormal and inconclusive findings on diagnostic imaging of breast: Secondary | ICD-10-CM

## 2019-06-12 ENCOUNTER — Other Ambulatory Visit: Payer: Self-pay

## 2019-06-12 ENCOUNTER — Ambulatory Visit
Admission: RE | Admit: 2019-06-12 | Discharge: 2019-06-12 | Disposition: A | Discharge status: Home / Self Care | Payer: Managed Care, Other (non HMO) | Source: Ambulatory Visit | Attending: Obstetrics and Gynecology | Admitting: Obstetrics and Gynecology

## 2019-06-12 DIAGNOSIS — R928 Other abnormal and inconclusive findings on diagnostic imaging of breast: Secondary | ICD-10-CM

## 2019-07-15 ENCOUNTER — Ambulatory Visit: Payer: Managed Care, Other (non HMO) | Admitting: Family Medicine

## 2019-08-19 ENCOUNTER — Telehealth: Payer: Self-pay | Admitting: Family Medicine

## 2019-08-19 NOTE — Telephone Encounter (Signed)
called pt LVM for her to call back and reschedule appt FR

## 2019-09-08 ENCOUNTER — Other Ambulatory Visit: Payer: Self-pay | Admitting: Family Medicine

## 2019-09-08 DIAGNOSIS — J310 Chronic rhinitis: Secondary | ICD-10-CM

## 2019-10-16 ENCOUNTER — Encounter: Payer: Managed Care, Other (non HMO) | Admitting: Family Medicine

## 2019-10-19 ENCOUNTER — Other Ambulatory Visit: Payer: Self-pay

## 2019-10-19 ENCOUNTER — Ambulatory Visit (INDEPENDENT_AMBULATORY_CARE_PROVIDER_SITE_OTHER): Payer: Managed Care, Other (non HMO) | Admitting: Family Medicine

## 2019-10-19 ENCOUNTER — Encounter: Payer: Self-pay | Admitting: Family Medicine

## 2019-10-19 VITALS — BP 132/82 | HR 75 | Temp 98.2°F | Resp 17 | Ht 64.0 in | Wt 142.2 lb

## 2019-10-19 DIAGNOSIS — E785 Hyperlipidemia, unspecified: Secondary | ICD-10-CM | POA: Diagnosis not present

## 2019-10-19 DIAGNOSIS — Z0001 Encounter for general adult medical examination with abnormal findings: Secondary | ICD-10-CM | POA: Diagnosis not present

## 2019-10-19 DIAGNOSIS — Z23 Encounter for immunization: Secondary | ICD-10-CM | POA: Diagnosis not present

## 2019-10-19 DIAGNOSIS — Z0000 Encounter for general adult medical examination without abnormal findings: Secondary | ICD-10-CM

## 2019-10-19 DIAGNOSIS — Z131 Encounter for screening for diabetes mellitus: Secondary | ICD-10-CM

## 2019-10-19 DIAGNOSIS — E559 Vitamin D deficiency, unspecified: Secondary | ICD-10-CM | POA: Diagnosis not present

## 2019-10-19 DIAGNOSIS — R202 Paresthesia of skin: Secondary | ICD-10-CM

## 2019-10-19 DIAGNOSIS — G43009 Migraine without aura, not intractable, without status migrainosus: Secondary | ICD-10-CM

## 2019-10-19 MED ORDER — RIZATRIPTAN BENZOATE 10 MG PO TABS: 10.0000 mg | ORAL_TABLET | ORAL | 1 refills | Status: DC | PRN

## 2019-10-19 NOTE — Patient Instructions (Signed)
° ° ° °  If you have lab work done today you will be contacted with your lab results within the next 2 weeks.  If you have not heard from us then please contact us. The fastest way to get your results is to register for My Chart. ° ° °IF you received an x-ray today, you will receive an invoice from [facility]. Please contact [facility] at [phone] with questions or concerns regarding your invoice.  ° °IF you received labwork today, you will receive an invoice from [facility]. Please contact [facility] at [phone] with questions or concerns regarding your invoice.  ° °Our billing staff will not be able to assist you with questions regarding bills from these companies. ° °You will be contacted with the lab results as soon as they are available. The fastest way to get your results is to activate your My Chart account. Instructions are located on the last page of this paperwork. If you have not heard from us regarding the results in 2 weeks, please contact this office. °  ° ° ° °
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
## 2019-10-20 LAB — COMPREHENSIVE METABOLIC PANEL
ALT: 16 IU/L (ref 0–32)
AST: 23 IU/L (ref 0–40)
Albumin/Globulin Ratio: 1.8 (ref 1.2–2.2)
Albumin: 4.7 g/dL (ref 3.8–4.9)
Alkaline Phosphatase: 78 IU/L (ref 39–117)
BUN/Creatinine Ratio: 17 (ref 9–23)
BUN: 15 mg/dL (ref 6–24)
Bilirubin Total: 0.3 mg/dL (ref 0.0–1.2)
CO2: 26 mmol/L (ref 20–29)
Calcium: 9.2 mg/dL (ref 8.7–10.2)
Chloride: 101 mmol/L (ref 96–106)
Creatinine, Ser: 0.87 mg/dL (ref 0.57–1.00)
GFR calc Af Amer: 86 mL/min/{1.73_m2} (ref >59)
GFR calc non Af Amer: 74 mL/min/{1.73_m2} (ref >59)
Globulin, Total: 2.6 g/dL (ref 1.5–4.5)
Glucose: 73 mg/dL (ref 65–99)
Potassium: 4.2 mmol/L (ref 3.5–5.2)
Sodium: 142 mmol/L (ref 134–144)
Total Protein: 7.3 g/dL (ref 6.0–8.5)

## 2019-10-20 LAB — VITAMIN D 25 HYDROXY (VIT D DEFICIENCY, FRACTURES): Vit D, 25-Hydroxy: 46.8 ng/mL (ref 30.0–100.0)

## 2019-10-20 LAB — LIPID PANEL
Chol/HDL Ratio: 3 ratio (ref 0.0–4.4)
Cholesterol, Total: 192 mg/dL (ref 100–199)
HDL: 63 mg/dL (ref >39)
LDL Chol Calc (NIH): 117 mg/dL — ABNORMAL HIGH (ref 0–99)
Triglycerides: 67 mg/dL (ref 0–149)
VLDL Cholesterol Cal: 12 mg/dL (ref 5–40)

## 2019-10-20 LAB — CBC
Hematocrit: 41.8 % (ref 34.0–46.6)
Hemoglobin: 13.7 g/dL (ref 11.1–15.9)
MCH: 29.3 pg (ref 26.6–33.0)
MCHC: 32.8 g/dL (ref 31.5–35.7)
MCV: 90 fL (ref 79–97)
Platelets: 245 10*3/uL (ref 150–450)
RBC: 4.67 x10E6/uL (ref 3.77–5.28)
RDW: 12.1 % (ref 11.7–15.4)
WBC: 5.9 10*3/uL (ref 3.4–10.8)

## 2019-10-20 LAB — HEMOGLOBIN A1C
Est. average glucose Bld gHb Est-mCnc: 97 mg/dL
Hgb A1c MFr Bld: 5 % (ref 4.8–5.6)

## 2019-10-20 LAB — VITAMIN B12: Vitamin B-12: 783 pg/mL (ref 232–1245)

## 2019-10-20 LAB — TSH: TSH: 2.07 u[IU]/mL (ref 0.450–4.500)

## 2019-11-19 ENCOUNTER — Encounter: Payer: Self-pay | Admitting: Family Medicine

## 2019-11-25 ENCOUNTER — Encounter: Payer: Self-pay | Admitting: Neurology

## 2019-11-25 NOTE — Progress Notes (Signed)
Virtual Visit via Video Note The purpose of this virtual visit is to provide medical care while limiting exposure to the novel coronavirus.    Consent was obtained for video visit:  Yes.   Answered questions that patient had about telehealth interaction:  Yes.   I discussed the limitations, risks, security and privacy concerns of performing an evaluation and management service by telemedicine. I also discussed with the patient that there may be a patient responsible charge related to this service. The patient expressed understanding and agreed to proceed.  Pt location: Home Physician Location: office Name of referring provider:  Forrest Moron, MD I connected with Tamara Lutz at patients initiation/request on 11/26/2019 at  1:50 PM EST by video enabled telemedicine application and verified that I am speaking with the correct person using two identifiers. Pt MRN:  [id number] Pt DOB:  1961/04/22 Video Participants:  Tamara Lutz   History of Present Illness:  Tamara Lutz is a 59 year old female who presents for migraines and paresthesias.  History supplemented by referring provider note.  Onset:  Childhood, more prominent since young adulthood Location:  Over left eye, in sinuses Quality:  pressure Intensity: usually moderate, last one was severe.  She denies new headache, thunderclap headache.  They do sometimes wakes her up from sleep. Aura:  none Premonitory Phase:  none Postdrome:  fatigue Associated symptoms:  Lacrimation of left eye, photophobia, phonophobia, nausea and vomiting (once lasted 12 hours)..  She denies associated visual disturbance, unilateral numbness or weakness. Duration:  3 days Frequency:  1 to 2 times a month Frequency of abortive medication: 1 or 2 times a month Triggers:  Delayed eating, in sun too long Relieving factors:  Decongestant and antihistamine Activity:  aggravates  In late summer 2020, she developed numbness and  tingling in one foot and then started in the other foot.  It is a squeezing sensation in feet and ball of feet.  If she taps her feet, she notes tingling.  It feels like she is walking on blocks of wood.  Soaking in ice water and massage helps but numbness quickly returns.  She has tried taking B complex vitamin and vitamin D.  No back pain or radicular pain down the legs.  No weakness.  She has history of plantar fasciitis which has improved since changing shoes but the numbness persists.  Labs from 10/19/2019 were normal, inclucing CBC and CMP,  B12 783, D 25-hydroxy 46.8, Hgb A1c 5.0, and TSH 2.070.    About 15 years ago, she had an episode where she was experiencing unilateral sensory disturbance.  Her mother had multiple sclerosis, so she was tested for that.  She had multiple MRIs which were reportedly unremarkable.  She also had a NCV-EMG which was also normal.  Symptoms subsequently resolved and she never had a recurrence.  Rescue protocol:  1st Excedrin Migraine; 2nd decongestant and antihistamine Current NSAIDS:  none Current analgesics:  Excedrin Migraine Current triptans:  Rizatriptan 10mg  (took it once and effective) Current ergotamine:  none Current anti-emetic:  none Current muscle relaxants:  none Current anti-anxiolytic:  none Current sleep aide:  none Current Antihypertensive medications:  none Current Antidepressant medications:  none Current Anticonvulsant medications:  none Current anti-CGRP:  none Current Vitamins/Herbal/Supplements:  B complex, D Current Antihistamines/Decongestants:  Flonase, Zyrtec, Benadryl Other therapy:  MVI Hormone/birth control:  none   Past NSAIDS:  Ibuprofen, naproxen Past analgesics:  Tylenol Past abortive triptans:  Sumatriptan 5mg  NS Past  abortive ergotamine:  none Past muscle relaxants:  none Past anti-emetic:  none Past antihypertensive medications:  none Past antidepressant medications:  none Past anticonvulsant medications:   none Past anti-CGRP:  none Past vitamins/Herbal/Supplements:  none Past antihistamines/decongestants:  none Other past therapies:  none  Caffeine:  No coffee.  A Coke or tea once in awhile Diet:  24 oz water daily; usually does not skip meals Exercise:  Tries to exercise excepted limited due to foot problems Depression:  no; Anxiety:  A little bit Other pain:  Pain in feet Sleep:  Poor (menopause) Family history of headache:  Daughter (migraines)   Past Medical History: Past Medical History:  Diagnosis Date  . Allergy   . Hyperlipidemia   . Osteopenia     Medications: Outpatient Encounter Medications as of 11/26/2019  Medication Sig  . atorvastatin (LIPITOR) 10 MG tablet Take 1 tablet (10 mg total) by mouth daily at 6 PM.  . cetirizine (ZYRTEC) 10 MG chewable tablet Chew 10 mg by mouth daily.  . diphenhydrAMINE (BENADRYL) 25 mg capsule Take 25 mg by mouth every 6 (six) hours as needed.  . fluticasone (FLONASE) 50 MCG/ACT nasal spray Place 1 spray into both nostrils 2 (two) times daily.  . montelukast (SINGULAIR) 10 MG tablet TAKE 1 TABLET BY MOUTH AT BEDTIME  . Multiple Vitamin (MULTIVITAMIN) tablet Take 1 tablet by mouth daily.  . Olopatadine HCl (PATADAY) 0.2 % SOLN Apply 1 drop to eye daily.  Marland Kitchen OVER THE COUNTER MEDICATION Vitamin D 3 400 units, one capsule daily.  Marland Kitchen OVER THE COUNTER MEDICATION Calcium 600 mg one capsule daily.  Marland Kitchen OVER THE COUNTER MEDICATION Apple Cider Vinegar, 450 mg one daily.  . rizatriptan (MAXALT) 10 MG tablet Take 1 tablet (10 mg total) by mouth as needed for migraine. May repeat in 2 hours if needed   Facility-Administered Encounter Medications as of 11/26/2019  Medication  . 0.9 %  sodium chloride infusion    Allergies: No Known Allergies  Family History: Family History  Problem Relation Age of Onset  . Multiple sclerosis Mother   . Uterine cancer Mother   . Alzheimer's disease Father   . Breast cancer Sister 51  . Diabetes Neg Hx   .  Heart disease Neg Hx   . Hypertension Neg Hx   . Colon cancer Neg Hx   . Esophageal cancer Neg Hx   . Rectal cancer Neg Hx   . Stomach cancer Neg Hx     Social History: Social History   Socioeconomic History  . Marital status: Married    Spouse name: Not on file  . Number of children: Not on file  . Years of education: Not on file  . Highest education level: Not on file  Occupational History  . Not on file  Tobacco Use  . Smoking status: Never Smoker  . Smokeless tobacco: Never Used  Substance and Sexual Activity  . Alcohol use: No  . Drug use: No  . Sexual activity: Yes  Other Topics Concern  . Not on file  Social History Narrative  . Not on file   Social Determinants of Health   Financial Resource Strain:   . Difficulty of Paying Living Expenses: Not on file  Food Insecurity:   . Worried About Charity fundraiser in the Last Year: Not on file  . Ran Out of Food in the Last Year: Not on file  Transportation Needs:   . Lack of Transportation (Medical): Not on file  .  Lack of Transportation (Non-Medical): Not on file  Physical Activity:   . Days of Exercise per Week: Not on file  . Minutes of Exercise per Session: Not on file  Stress:   . Feeling of Stress : Not on file  Social Connections:   . Frequency of Communication with Friends and Family: Not on file  . Frequency of Social Gatherings with Friends and Family: Not on file  . Attends Religious Services: Not on file  . Active Member of Clubs or Organizations: Not on file  . Attends Archivist Meetings: Not on file  . Marital Status: Not on file  Intimate Partner Violence:   . Fear of Current or Ex-Partner: Not on file  . Emotionally Abused: Not on file  . Physically Abused: Not on file  . Sexually Abused: Not on file    Observations/Objective:   Height 5' 3.5" (1.613 m), weight 142 lb (64.4 kg), last menstrual period 04/09/2012. No acute distress.  Alert and oriented.  Speech fluent and not  dysarthric.  Language intact.  Eyes orthophoric on primary gaze.  Face symmetric.  Assessment and Plan:   1.  Migraine without aura, without status migrainosus, not intractable.  2.  Numbness and tingling on soles of feet.  Unclear if may be a more diffuse polyneuropathy or something like a neuroma or tarsal tunnel syndrome.  She does not exhibit any low back pain or radicular symptoms to suggest S1 radiculopathy.  1.  For migraines, she will try taking rizatriptan at earliest onset of migraine.  If ineffective, she will contact me and we can try a different abortive therapy.  As migraines are infrequent, we will defer a preventative medication at this time. 2.  I will refer her to a podiatrist.  If there is no treatment offered, we can pursue NCV-EMG   Follow Up Instructions:    -I discussed the assessment and treatment plan with the patient. The patient was provided an opportunity to ask questions and all were answered. The patient agreed with the plan and demonstrated an understanding of the instructions.   The patient was advised to call back or seek an in-person evaluation if the symptoms worsen or if the condition fails to improve as anticipated.    Dudley Major, DO

## 2019-11-26 ENCOUNTER — Other Ambulatory Visit: Payer: Self-pay

## 2019-11-26 ENCOUNTER — Telehealth (INDEPENDENT_AMBULATORY_CARE_PROVIDER_SITE_OTHER): Payer: Managed Care, Other (non HMO) | Admitting: Neurology

## 2019-11-26 VITALS — Ht 63.5 in | Wt 142.0 lb

## 2019-11-26 DIAGNOSIS — R200 Anesthesia of skin: Secondary | ICD-10-CM | POA: Diagnosis not present

## 2019-11-26 DIAGNOSIS — G43009 Migraine without aura, not intractable, without status migrainosus: Secondary | ICD-10-CM

## 2019-11-27 ENCOUNTER — Other Ambulatory Visit: Payer: Self-pay

## 2019-11-27 DIAGNOSIS — R200 Anesthesia of skin: Secondary | ICD-10-CM

## 2019-12-05 ENCOUNTER — Ambulatory Visit: Payer: Managed Care, Other (non HMO) | Attending: Internal Medicine

## 2019-12-05 DIAGNOSIS — Z23 Encounter for immunization: Secondary | ICD-10-CM | POA: Insufficient documentation

## 2019-12-05 NOTE — Progress Notes (Signed)
   Covid-19 Vaccination Clinic  Name:  Tamara Lutz    MRN: [id number] DOB: 1961-02-02  12/05/2019  [name] was observed post Covid-19 immunization for 15 minutes without incidence. She was provided with Vaccine Information Sheet and instruction to access the V-Safe system.   [name] was instructed to call 911 with any severe reactions post vaccine: Marland Kitchen Difficulty breathing  . Swelling of your face and throat  . A fast heartbeat  . A bad rash all over your body  . Dizziness and weakness    Immunizations Administered    Name Date Dose VIS Date Route   Pfizer COVID-19 Vaccine 12/05/2019 10:02 AM 0.3 mL 09/18/2019 Intramuscular   Manufacturer: [place]   Lot: [id number]   [place]: [id number]

## 2019-12-07 ENCOUNTER — Ambulatory Visit (INDEPENDENT_AMBULATORY_CARE_PROVIDER_SITE_OTHER): Payer: Managed Care, Other (non HMO) | Admitting: Podiatry

## 2019-12-07 ENCOUNTER — Other Ambulatory Visit: Payer: Self-pay | Admitting: Podiatry

## 2019-12-07 ENCOUNTER — Encounter: Payer: Self-pay | Admitting: Podiatry

## 2019-12-07 ENCOUNTER — Ambulatory Visit (INDEPENDENT_AMBULATORY_CARE_PROVIDER_SITE_OTHER): Payer: Managed Care, Other (non HMO)

## 2019-12-07 ENCOUNTER — Other Ambulatory Visit: Payer: Self-pay

## 2019-12-07 ENCOUNTER — Telehealth: Payer: Self-pay | Admitting: Family Medicine

## 2019-12-07 VITALS — BP 157/93 | HR 68 | Temp 96.4°F

## 2019-12-07 DIAGNOSIS — G629 Polyneuropathy, unspecified: Secondary | ICD-10-CM

## 2019-12-07 DIAGNOSIS — M79672 Pain in left foot: Secondary | ICD-10-CM | POA: Diagnosis not present

## 2019-12-07 DIAGNOSIS — M79671 Pain in right foot: Secondary | ICD-10-CM

## 2019-12-07 MED ORDER — GABAPENTIN 300 MG PO CAPS: 300.0000 mg | ORAL_CAPSULE | Freq: Three times a day (TID) | ORAL | 3 refills | Status: DC

## 2019-12-07 NOTE — Telephone Encounter (Signed)
Pt called regarding her shingles shot she  got on 10/19/19. Pt was wondering if she needs a second shot or if she just gets the one. Pt would like nurse to call her [phone]  Please advise.

## 2019-12-07 NOTE — Telephone Encounter (Signed)
Called patient and lvm letting her know that it is two parts and at least two months for the 2nd to call back if any questions or concerns

## 2019-12-08 ENCOUNTER — Other Ambulatory Visit: Payer: Self-pay | Admitting: Family Medicine

## 2019-12-08 DIAGNOSIS — J310 Chronic rhinitis: Secondary | ICD-10-CM

## 2019-12-08 NOTE — Telephone Encounter (Signed)
Requested Prescriptions  Pending Prescriptions Disp Refills  . montelukast (SINGULAIR) 10 MG tablet [Pharmacy Med Name: Montelukast Sodium 10 MG Oral Tablet] 90 tablet 0    Sig: TAKE 1 TABLET BY MOUTH AT BEDTIME     Pulmonology:  Leukotriene Inhibitors Passed - 12/08/2019  1:10 PM      Passed - Valid encounter within last 12 months    Recent Outpatient Visits          1 month ago Encounter for health maintenance examination in adult   Primary Care at [facility], [place] A, MD   10 months ago Screening, lipid   Primary Care at [facility], Arlie Solomons, MD   10 months ago Dyslipidemia   Primary Care at [facility], Arlie Solomons, MD   1 year ago Dyslipidemia   Primary Care at [place], Arlie Solomons, MD   1 year ago Other viral warts   Primary Care at [facility], Arlie Solomons, MD

## 2019-12-14 NOTE — Progress Notes (Signed)
Subjective:   Patient ID: Tamara Lutz, female   DOB: 59 y.o.   MRN: [id number]   HPI Patient presents with mild numb-like sensation of both feet but states that it does not seem to really have worsened and she is got no history when questioned about falling or balance issues.  Patient does not smoke likes to be active   Review of Systems  All other systems reviewed and are negative.       Objective:  Physical Exam Vitals and nursing note reviewed.  Constitutional:      Appearance: She is well-developed.  Pulmonary:     Effort: Pulmonary effort is normal.  Musculoskeletal:        General: Normal range of motion.  Skin:    General: Skin is warm.  Neurological:     Mental Status: She is alert.     Neurovascular status was found to be intact muscle strength adequate with mild diminishment sharp dull vibratory negative for neuroma symptomatology mild inflammation of the forefoot bilateral and moderate depression of the arch noted.  Patient was found to have good digital perfusion well oriented x3     Assessment:  Inflammatory changes with the possibility for low-grade neuropathic changes 8     Plan:  NP condition reviewed and x-rays reviewed.  At this point I have advised on wider shoes soaks oral anti-inflammatories and if symptoms persist may need to consider more aggressive treatment options  X-rays were negative for signs of fracture negative for signs of advanced arthritis bilateral

## 2019-12-26 ENCOUNTER — Ambulatory Visit: Payer: Managed Care, Other (non HMO) | Attending: Internal Medicine

## 2019-12-26 DIAGNOSIS — Z23 Encounter for immunization: Secondary | ICD-10-CM

## 2019-12-26 NOTE — Progress Notes (Signed)
   Covid-19 Vaccination Clinic  Name:  Tamara Lutz    MRN: [id number] DOB: [birth year]  12/26/2019  Tamara Lutz was observed post Covid-19 immunization for 15 minutes without incident. She was provided with Vaccine Information Sheet and instruction to access the V-Safe system.   Tamara Lutz was instructed to call 911 with any severe reactions post vaccine: Marland Kitchen Difficulty breathing  . Swelling of face and throat  . A fast heartbeat  . A bad rash all over body  . Dizziness and weakness   Immunizations Administered    Name Date Dose VIS Date Route   Pfizer COVID-19 Vaccine 12/26/2019 10:28 AM 0.3 mL 09/18/2019 Intramuscular   Manufacturer: [place]   Lot: [id number]   [place]: [id number]

## 2019-12-30 ENCOUNTER — Ambulatory Visit: Payer: Managed Care, Other (non HMO)

## 2020-02-08 ENCOUNTER — Ambulatory Visit (INDEPENDENT_AMBULATORY_CARE_PROVIDER_SITE_OTHER): Payer: Managed Care, Other (non HMO) | Admitting: Podiatry

## 2020-02-08 ENCOUNTER — Ambulatory Visit (INDEPENDENT_AMBULATORY_CARE_PROVIDER_SITE_OTHER): Payer: Managed Care, Other (non HMO)

## 2020-02-08 ENCOUNTER — Other Ambulatory Visit: Payer: Self-pay

## 2020-02-08 ENCOUNTER — Encounter: Payer: Self-pay | Admitting: Podiatry

## 2020-02-08 ENCOUNTER — Other Ambulatory Visit: Payer: Self-pay | Admitting: Podiatry

## 2020-02-08 VITALS — Temp 96.5°F

## 2020-02-08 DIAGNOSIS — M779 Enthesopathy, unspecified: Secondary | ICD-10-CM

## 2020-02-08 DIAGNOSIS — M79671 Pain in right foot: Secondary | ICD-10-CM

## 2020-02-12 NOTE — Progress Notes (Signed)
Subjective:   Patient ID: Tamara Lutz, female   DOB: 59 y.o.   MRN: [id number]   HPI Patient presents stating she did not like the way that gabapentin made her feel so she stopped taking it.  States she continues to experience the same low-grade symptoms but she is having no current progression of symptomatology   ROS      Objective:  Physical Exam  Neurovascular status was found to be intact I checked muscle strength found to be adequate range of motion within normal limits.  Patient does have moderate depression of the arch but I did not note excessive depression of the arch and I was not able to palpate pain in any area     Assessment:  Difficult problem with the possibility the patient could have some low-grade idiopathic neuropathy versus other problems but does not appear at this point to be any kind of systemic condition     Plan:  H&P spent a great deal of time educating her on this and that I would advise her adjusted continue to do the activities she is doing continue to wear good shoe gear and take anti-inflammatories as needed.  If there was found to be any progression of symptoms we will consider neurological consult and I did discuss neurologist with possibilities for nerve conduction studies EMG studies.  Patient will be seen back as needed

## 2020-03-02 ENCOUNTER — Other Ambulatory Visit: Payer: Self-pay | Admitting: Family Medicine

## 2020-03-02 DIAGNOSIS — E785 Hyperlipidemia, unspecified: Secondary | ICD-10-CM

## 2020-03-23 ENCOUNTER — Other Ambulatory Visit: Payer: Self-pay | Admitting: Family Medicine

## 2020-03-23 DIAGNOSIS — J310 Chronic rhinitis: Secondary | ICD-10-CM

## 2020-03-23 NOTE — Telephone Encounter (Signed)
Copied from [place] [phone]. Topic: Quick Communication - Rx Refill/Question >> Mar 23, 2020  4:53 PM Mcneil, Ja-Kwan wrote: Medication: rizatriptan (MAXALT) 10 MG tablet and montelukast (SINGULAIR) 10 MG tablet  Has the patient contacted their pharmacy? yes   Preferred Pharmacy (with phone number or street name): [facility] [place]  Phone: [phone]  Fax: [phone]  Agent: Please be advised that RX refills may take up to 3 business days. We ask that you follow-up with your pharmacy.

## 2020-03-23 NOTE — Telephone Encounter (Signed)
Requested medication (s) are due for refill today: yes  Requested medication (s) are on the active medication list:yes  Last refill: Maxalt 10/19/19,  Singular 12/08/19  Future visit scheduled:yes  Scheduled to see DR Pamella Pert 05/16/20  Notes to clinic:  Dr Nolon Rod patient    Requested Prescriptions  Pending Prescriptions Disp Refills   montelukast (SINGULAIR) 10 MG tablet 90 tablet 0    Sig: Take 1 tablet (10 mg total) by mouth at bedtime.      Pulmonology:  Leukotriene Inhibitors Passed - 03/23/2020  5:11 PM      Passed - Valid encounter within last 12 months    Recent Outpatient Visits           5 months ago Encounter for health maintenance examination in adult   Primary Care at [facility], Arlie Solomons, MD   1 year ago Screening, lipid   Primary Care at [facility], Arlie Solomons, MD   1 year ago Dyslipidemia   Primary Care at [facility], Arlie Solomons, MD   1 year ago Dyslipidemia   Primary Care at [facility], Arlie Solomons, MD   1 year ago Other viral warts   Primary Care at [facility], Arlie Solomons, MD       Future Appointments             In 1 month Pamella Pert, Lilia Argue, MD Primary Care at [facility], [facility]              rizatriptan (MAXALT) 10 MG tablet 10 tablet 1    Sig: Take 1 tablet (10 mg total) by mouth as needed for migraine. May repeat in 2 hours if needed      Neurology:  Migraine Therapy - Triptan Failed - 03/23/2020  5:11 PM      Failed - Last BP in normal range    BP Readings from Last 1 Encounters:  12/07/19 (!) 157/93          Passed - Valid encounter within last 12 months    Recent Outpatient Visits           5 months ago Encounter for health maintenance examination in adult   Primary Care at [facility], Arlie Solomons, MD   1 year ago Screening, lipid   Primary Care at [facility], Arlie Solomons, MD   1 year ago Dyslipidemia   Primary Care at [facility], Arlie Solomons, MD   1 year ago Dyslipidemia   Primary Care at [facility], Arlie Solomons, MD   1 year ago  Other viral warts   Primary Care at [facility], Arlie Solomons, MD       Future Appointments             In 1 month Pamella Pert, Lilia Argue, MD Primary Care at [place], [facility]

## 2020-03-24 MED ORDER — MONTELUKAST SODIUM 10 MG PO TABS: 10.0000 mg | ORAL_TABLET | Freq: Every day | ORAL | 0 refills | Status: DC

## 2020-03-24 MED ORDER — RIZATRIPTAN BENZOATE 10 MG PO TABS: 10.0000 mg | ORAL_TABLET | ORAL | 1 refills | Status: DC | PRN

## 2020-03-24 NOTE — Progress Notes (Signed)
NEUROLOGY FOLLOW UP OFFICE NOTE  Tamara Lutz [id number]  HISTORY OF PRESENT ILLNESS: Tamara Lutz is a 59 year old female who follows up for paresthesias.  UPDATE: She saw the podiatrist.  X-rays okay.  Tried gabapentin 300mg  twice daily which caused drowsiness and dizziness and was ineffective.  Paresthesias and sensation of tightness are the same.  The numbness is in the toes, distal dorsum of feet and balls of her feet.  It is painful/aching to walk barefoot.  No burning, stinging or stabbing pain.  No back or radicular pain.  No symptoms in upper extremities.  HISTORY: In late summer 2020, she developed numbness and tingling in one foot and then started in the other foot.  It is a squeezing sensation in feet and ball of feet.  If she taps her feet, she notes tingling.  It feels like she is walking on blocks of wood.  Soaking in ice water and massage helps but numbness quickly returns.  She has tried taking B complex vitamin and vitamin D.  No back pain or radicular pain down the legs.  No weakness.  She has history of plantar fasciitis which has improved since changing shoes but the numbness persists.  Labs from 10/19/2019 were normal, inclucing CBC and CMP,  B12 783, D 25-hydroxy 46.8, Hgb A1c 5.0, and TSH 2.070.    About 15 years ago, she had an episode where she was experiencing unilateral sensory disturbance.  Her mother had multiple sclerosis, so she was tested for that.  She had multiple MRIs which were reportedly unremarkable.  She also had a NCV-EMG which was also normal.  Symptoms subsequently resolved and she never had a recurrence.  Mother had MS.  No known neuropathy in the family.  No exposure to heavy metals.  PAST MEDICAL HISTORY: Past Medical History:  Diagnosis Date  . Allergy   . Hyperlipidemia   . Osteopenia     MEDICATIONS: Current Outpatient Medications on File Prior to Visit  Medication Sig Dispense Refill  . atorvastatin (LIPITOR) 10 MG tablet  TAKE 1 TABLET BY MOUTH ONCE DAILY AT 6PM 90 tablet 0  . cetirizine (ZYRTEC) 10 MG chewable tablet Chew 10 mg by mouth daily.    . diphenhydrAMINE (BENADRYL) 25 mg capsule Take 25 mg by mouth every 6 (six) hours as needed.    . fluticasone (FLONASE) 50 MCG/ACT nasal spray Place 1 spray into both nostrils 2 (two) times daily. 48 g 3  . gabapentin (NEURONTIN) 300 MG capsule Take 1 capsule (300 mg total) by mouth 3 (three) times daily. 90 capsule 3  . ibandronate (BONIVA) 150 MG tablet Take 150 mg by mouth every 30 (thirty) days. Take in the morning with a full glass of water, on an empty stomach, and do not take anything else by mouth or lie down for the next 30 min.    . montelukast (SINGULAIR) 10 MG tablet Take 1 tablet (10 mg total) by mouth at bedtime. 90 tablet 0  . Multiple Vitamin (MULTIVITAMIN) tablet Take 1 tablet by mouth daily.    . Olopatadine HCl (PATADAY) 0.2 % SOLN Apply 1 drop to eye daily. 2.5 mL 3  . OVER THE COUNTER MEDICATION Vitamin D 3 400 units, one capsule daily.    Marland Kitchen OVER THE COUNTER MEDICATION Calcium 600 mg one capsule daily.    Marland Kitchen OVER THE COUNTER MEDICATION Apple Cider Vinegar, 450 mg one daily.    . rizatriptan (MAXALT) 10 MG tablet Take 1 tablet (  10 mg total) by mouth as needed for migraine. May repeat in 2 hours if needed 10 tablet 1   Current Facility-Administered Medications on File Prior to Visit  Medication Dose Route Frequency Provider Last Rate Last Admin  . 0.9 %  sodium chloride infusion  500 mL Intravenous Once Jackquline Denmark, MD        ALLERGIES: No Known Allergies  FAMILY HISTORY: Family History  Problem Relation Age of Onset  . Multiple sclerosis Mother   . Uterine cancer Mother   . Alzheimer's disease Father   . Breast cancer Sister 55  . Diabetes Neg Hx   . Heart disease Neg Hx   . Hypertension Neg Hx   . Colon cancer Neg Hx   . Esophageal cancer Neg Hx   . Rectal cancer Neg Hx   . Stomach cancer Neg Hx     SOCIAL HISTORY: Social History    Socioeconomic History  . Marital status: Married    Spouse name: Not on file  . Number of children: 5  . Years of education: 83  . Highest education level: Not on file  Occupational History  . Not on file  Tobacco Use  . Smoking status: Never Smoker  . Smokeless tobacco: Never Used  Vaping Use  . Vaping Use: Never used  Substance and Sexual Activity  . Alcohol use: Yes    Comment: occasionally  . Drug use: No  . Sexual activity: Yes  Other Topics Concern  . Not on file  Social History Narrative   Right handed   One story home   Drinks caffeine    Social Determinants of Health   Financial Resource Strain:   . Difficulty of Paying Living Expenses:   Food Insecurity:   . Worried About Charity fundraiser in the Last Year:   . Arboriculturist in the Last Year:   Transportation Needs:   . Film/video editor (Medical):   Marland Kitchen Lack of Transportation (Non-Medical):   Physical Activity:   . Days of Exercise per Week:   . Minutes of Exercise per Session:   Stress:   . Feeling of Stress :   Social Connections:   . Frequency of Communication with Friends and Family:   . Frequency of Social Gatherings with Friends and Family:   . Attends Religious Services:   . Active Member of Clubs or Organizations:   . Attends Archivist Meetings:   Marland Kitchen Marital Status:   Intimate Partner Violence:   . Fear of Current or Ex-Partner:   . Emotionally Abused:   Marland Kitchen Physically Abused:   . Sexually Abused:     PHYSICAL EXAM: Blood pressure 114/78, pulse 75, height 5\' 3"  (1.6 m), weight 137 lb (62.1 kg), last menstrual period 04/09/2012, SpO2 95 %. General: No acute distress.  Patient appears well-groomed.   Head:  Normocephalic/atraumatic Eyes:  Fundi examined but not visualized Neck: supple, no paraspinal tenderness, full range of motion Heart:  Regular rate and rhythm Lungs:  Clear to auscultation bilaterally Back: No paraspinal tenderness Neurological Exam: alert and  oriented to person, place, and time. Attention span and concentration intact, recent and remote memory intact, fund of knowledge intact.  Speech fluent and not dysarthric, language intact.  CN II-XII intact. Bulk and tone normal, muscle strength 5/5 throughout.  Sensation to pinprick and vibration intact.  Deep tendon reflexes 2+ throughout, toes downgoing.  Finger to nose and heel to shin testing intact.  Gait normal, Romberg  negative.  Positive Tinel's at both tarsal tunnels.  IMPRESSION: Numbness and tingling in feet.  May be small fiber neuropathy.  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  No back or radicular pain in the legs to suggest lumbosacral stenosis/radiculopathy.  PLAN: 1.  NCV-EMG of lower extremities 2.  Will check other labs:  ANA, sed rate, ACE, SPEP/IFE 3.  Further recommendations pending results.  Metta Clines, DO  CC:  Rochele Raring, MD

## 2020-03-25 ENCOUNTER — Other Ambulatory Visit: Payer: Self-pay

## 2020-03-25 ENCOUNTER — Encounter: Payer: Self-pay | Admitting: Neurology

## 2020-03-25 ENCOUNTER — Ambulatory Visit (INDEPENDENT_AMBULATORY_CARE_PROVIDER_SITE_OTHER): Payer: Managed Care, Other (non HMO) | Admitting: Neurology

## 2020-03-25 ENCOUNTER — Other Ambulatory Visit (INDEPENDENT_AMBULATORY_CARE_PROVIDER_SITE_OTHER): Payer: Managed Care, Other (non HMO)

## 2020-03-25 VITALS — BP 114/78 | HR 75 | Ht 63.0 in | Wt 137.0 lb

## 2020-03-25 DIAGNOSIS — R202 Paresthesia of skin: Secondary | ICD-10-CM

## 2020-03-25 DIAGNOSIS — G43009 Migraine without aura, not intractable, without status migrainosus: Secondary | ICD-10-CM

## 2020-03-25 DIAGNOSIS — R200 Anesthesia of skin: Secondary | ICD-10-CM

## 2020-03-25 LAB — SEDIMENTATION RATE: Sed Rate: 14 mm/hr (ref 0–30)

## 2020-03-25 NOTE — Patient Instructions (Signed)
1.  We will check ANA w/reflex, sed rate, ACE, SPEP/IFE, SSA/SSB antibodies 2.  We will check nerve conduction study/EMG of both lower extemities 3.  Further recommendations pending results.

## 2020-03-27 LAB — ENA+DNA/DS+SJORGEN'S
ENA RNP Ab: <0.2 AI (ref 0.0–0.9)
ENA SM Ab Ser-aCnc: <0.2 AI (ref 0.0–0.9)
ENA SSA (RO) Ab: <0.2 AI (ref 0.0–0.9)
ENA SSB (LA) Ab: <0.2 AI (ref 0.0–0.9)
dsDNA Ab: <1 IU/mL (ref 0–9)

## 2020-03-27 LAB — ANA W/REFLEX: Anti Nuclear Antibody (ANA): POSITIVE — AB

## 2020-03-28 LAB — PROTEIN ELECTROPHORESIS, SERUM
Albumin ELP: 4.2 g/dL (ref 3.8–4.8)
Alpha 1: 0.3 g/dL (ref 0.2–0.3)
Alpha 2: 0.8 g/dL (ref 0.5–0.9)
Beta 2: 0.4 g/dL (ref 0.2–0.5)
Beta Globulin: 0.4 g/dL (ref 0.4–0.6)
Gamma Globulin: 1.1 g/dL (ref 0.8–1.7)
Total Protein: 7.2 g/dL (ref 6.1–8.1)

## 2020-03-28 LAB — ANGIOTENSIN CONVERTING ENZYME: Angiotensin-Converting Enzyme: 28 U/L (ref 9–67)

## 2020-03-28 LAB — IMMUNOFIXATION ELECTROPHORESIS
IgG (Immunoglobin G), Serum: 1133 mg/dL (ref 600–1640)
IgM, Serum: 228 mg/dL (ref 50–300)
Immunofix Electr Int: NOT DETECTED
Immunoglobulin A: 283 mg/dL (ref 47–310)

## 2020-03-28 LAB — SJOGRENS SYNDROME-B EXTRACTABLE NUCLEAR ANTIBODY: SSB (La) (ENA) Antibody, IgG: <1 AI

## 2020-03-28 LAB — SJOGRENS SYNDROME-A EXTRACTABLE NUCLEAR ANTIBODY: SSA (Ro) (ENA) Antibody, IgG: <1 AI

## 2020-05-16 ENCOUNTER — Encounter: Payer: Managed Care, Other (non HMO) | Admitting: Family Medicine

## 2020-05-25 ENCOUNTER — Other Ambulatory Visit: Payer: Self-pay

## 2020-05-25 ENCOUNTER — Ambulatory Visit (INDEPENDENT_AMBULATORY_CARE_PROVIDER_SITE_OTHER): Payer: Managed Care, Other (non HMO) | Admitting: Neurology

## 2020-05-25 DIAGNOSIS — R200 Anesthesia of skin: Secondary | ICD-10-CM | POA: Diagnosis not present

## 2020-05-25 DIAGNOSIS — R202 Paresthesia of skin: Secondary | ICD-10-CM | POA: Diagnosis not present

## 2020-05-25 NOTE — Procedures (Signed)
[facility] Neurology  [address]  [place], [place] [address] Tel: [phone] Fax:  [phone] Test Date:  05/25/2020  Patient: Tamara Lutz DOB: 08-Mar-1961 Physician: Narda Amber, DO  Sex: Female Height: 5\' 3"  Ref Phys: Metta Clines, D.O.  ID#: [id number] Temp: 32.0C Technician:    Patient Complaints: This is a 59 year old female referred for evaluation of bilateral feet pain and paresthesias.  NCV & EMG Findings: Electrodiagnostic testing of the right lower extremity and additional studies of the left shows: 1. Bilateral sural and superficial peroneal sensory responses are within normal limits. 2. Bilateral peroneal and tibial motor responses are within normal limits. 3. Bilateral tibial H reflex studies are within normal limits. 4. There is no evidence of active or chronic motor axonal changes affecting any of the tested muscles.  Motor unit configuration and recruitment pattern is within normal limits.   Impression: This is a normal study of the lower extremities.  In particular, there is no evidence of a sensorimotor polyneuropathy or lumbosacral radiculopathy.     ___________________________ Narda Amber, DO    Nerve Conduction Studies Anti Sensory Summary Table   Stim Site NR Peak (ms) Norm Peak (ms) P-T Amp (V) Norm P-T Amp  Left Sup Peroneal Anti Sensory (Ant Lat Mall)  32C  12 cm    2.3 <4.6 21.6 >4  Right Sup Peroneal Anti Sensory (Ant Lat Mall)  32C  12 cm    2.0 <4.6 18.1 >4  Left Sural Anti Sensory (Lat Mall)  32C  Calf    3.3 <4.6 12.5 >4  Right Sural Anti Sensory (Lat Mall)  32C  Calf    2.8 <4.6 14.5 >4   Motor Summary Table   Stim Site NR Onset (ms) Norm Onset (ms) O-P Amp (mV) Norm O-P Amp Site1 Site2 Delta-0 (ms) Dist (cm) Vel (m/s) Norm Vel (m/s)  Left Peroneal Motor (Ext Dig Brev)  32C  Ankle    3.5 <6.0 3.5 >2.5 B Fib Ankle 7.2 36.0 50 >40  B Fib    10.7  3.4  Poplt B Fib 1.6 8.0 50 >40  Poplt    12.3  3.3          Right Peroneal Motor (Ext Dig Brev)  32C  Ankle    3.4 <6.0 3.8 >2.5 B Fib Ankle 6.4 35.0 55 >40  B Fib    9.8  3.7  Poplt B Fib 1.2 8.0 67 >40  Poplt    11.0  3.6         Left Tibial Motor (Abd Hall Brev)  32C  Ankle    3.2 <6.0 19.4 >4 Knee Ankle 7.9 40.0 51 >40  Knee    11.1  14.0         Right Tibial Motor (Abd Hall Brev)  32C  Ankle    3.0 <6.0 23.6 >4 Knee Ankle 7.3 38.0 52 >40  Knee    10.3  16.5          H Reflex Studies   NR H-Lat (ms) Lat Norm (ms) L-R H-Lat (ms)  Left Tibial (Gastroc)  32C     32.11 <35 0.00  Right Tibial (Gastroc)  32C     32.11 <35 0.00   EMG   Side Muscle Ins Act Fibs Psw Fasc Number Recrt Dur Dur. Amp Amp. Poly Poly. Comment  Right AntTibialis Nml Nml Nml Nml Nml Nml Nml Nml Nml Nml Nml Nml N/A  Right Gastroc Nml Nml Nml Nml  Nml Nml Nml Nml Nml Nml Nml Nml N/A  Right Flex Dig Long Nml Nml Nml Nml Nml Nml Nml Nml Nml Nml Nml Nml N/A  Right BicepsFemS Nml Nml Nml Nml Nml Nml Nml Nml Nml Nml Nml Nml N/A  Left BicepsFemS Nml Nml Nml Nml Nml Nml Nml Nml Nml Nml Nml Nml N/A  Left AntTibialis Nml Nml Nml Nml Nml Nml Nml Nml Nml Nml Nml Nml N/A  Left Gastroc Nml Nml Nml Nml Nml Nml Nml Nml Nml Nml Nml Nml N/A  Left Flex Dig Long Nml Nml Nml Nml Nml Nml Nml Nml Nml Nml Nml Nml N/A  Left RectFemoris Nml Nml Nml Nml Nml Nml Nml Nml Nml Nml Nml Nml N/A      Waveforms:

## 2020-05-27 ENCOUNTER — Telehealth: Payer: Self-pay

## 2020-05-27 NOTE — Telephone Encounter (Signed)
-----   Message from Pieter Partridge, DO sent at 05/26/2020  8:15 PM EDT ----- Nerve study is normal.  However, this test may not detect all neuropathies.  If she is willing, I would like to send her for a punch skin biopsy, in which we take a tiny piece of her skin from her lower leg to test for nerve damage.

## 2020-05-27 NOTE — Telephone Encounter (Signed)
Pt returned our call, Pt advised of her EMG results. Pt wants to think about the Punch Biopsy. And give [place] a call back.

## 2020-06-28 ENCOUNTER — Ambulatory Visit: Payer: Managed Care, Other (non HMO) | Admitting: Neurology

## 2020-06-30 ENCOUNTER — Telehealth: Payer: Self-pay | Admitting: Family Medicine

## 2020-06-30 ENCOUNTER — Other Ambulatory Visit: Payer: Self-pay

## 2020-06-30 DIAGNOSIS — E785 Hyperlipidemia, unspecified: Secondary | ICD-10-CM

## 2020-06-30 DIAGNOSIS — J310 Chronic rhinitis: Secondary | ICD-10-CM

## 2020-06-30 MED ORDER — MONTELUKAST SODIUM 10 MG PO TABS: 10.0000 mg | ORAL_TABLET | Freq: Every day | ORAL | 0 refills | Status: DC

## 2020-06-30 MED ORDER — ATORVASTATIN CALCIUM 10 MG PO TABS: ORAL_TABLET | ORAL | 0 refills | Status: DC

## 2020-06-30 NOTE — Telephone Encounter (Signed)
Pt is needing refills on this medication pt does have a toc sch she had one with santiago but due to her leaving office so she set it up with the new female provider Kelsa Just for 08/01/20. Pt would like a call when these refills are sent in.    atorvastatin (LIPITOR) 10 MG tablet [[id number]]   montelukast (SINGULAIR) 10 MG tablet [[id number]

## 2020-07-15 ENCOUNTER — Encounter: Payer: Managed Care, Other (non HMO) | Admitting: Family Medicine

## 2020-07-28 ENCOUNTER — Other Ambulatory Visit: Payer: Self-pay

## 2020-07-28 MED ORDER — RIZATRIPTAN BENZOATE 10 MG PO TABS: 10.0000 mg | ORAL_TABLET | ORAL | 1 refills | Status: DC | PRN

## 2020-07-28 NOTE — Telephone Encounter (Signed)
Patient last OV 10/2019, has a TOC 10/27 . Please refill rizatriptan if appropriate.

## 2020-08-01 ENCOUNTER — Encounter: Payer: Managed Care, Other (non HMO) | Admitting: Family Medicine

## 2020-08-02 ENCOUNTER — Encounter: Payer: Managed Care, Other (non HMO) | Admitting: Family Medicine

## 2020-08-02 IMAGING — MG MM DIGITAL DIAGNOSTIC UNILAT*L* W/ TOMO W/ CAD
4 series · 4 of 12 positions shown · non-contrast
Comparison: June 02, 2019 and earlier priors

CLINICAL DATA: 58-year-old patient recalled from recent screening
mammogram for evaluation of a possible left breast mass.

EXAM:
DIGITAL DIAGNOSTIC LEFT MAMMOGRAM WITH TOMO
ULTRASOUND LEFT BREAST

[L MLO synth-2D]
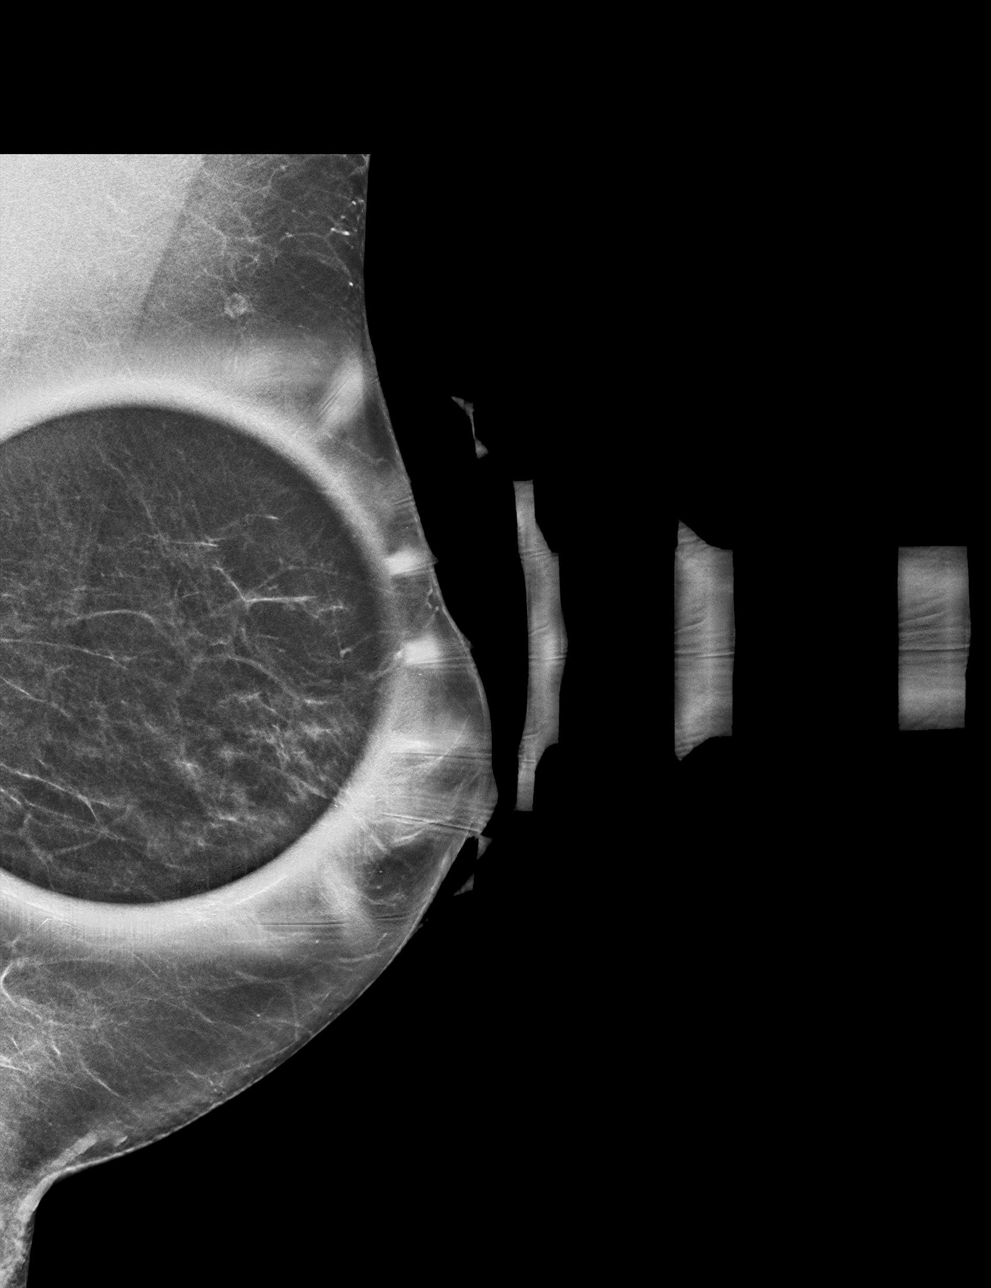

[L CC synth-2D]
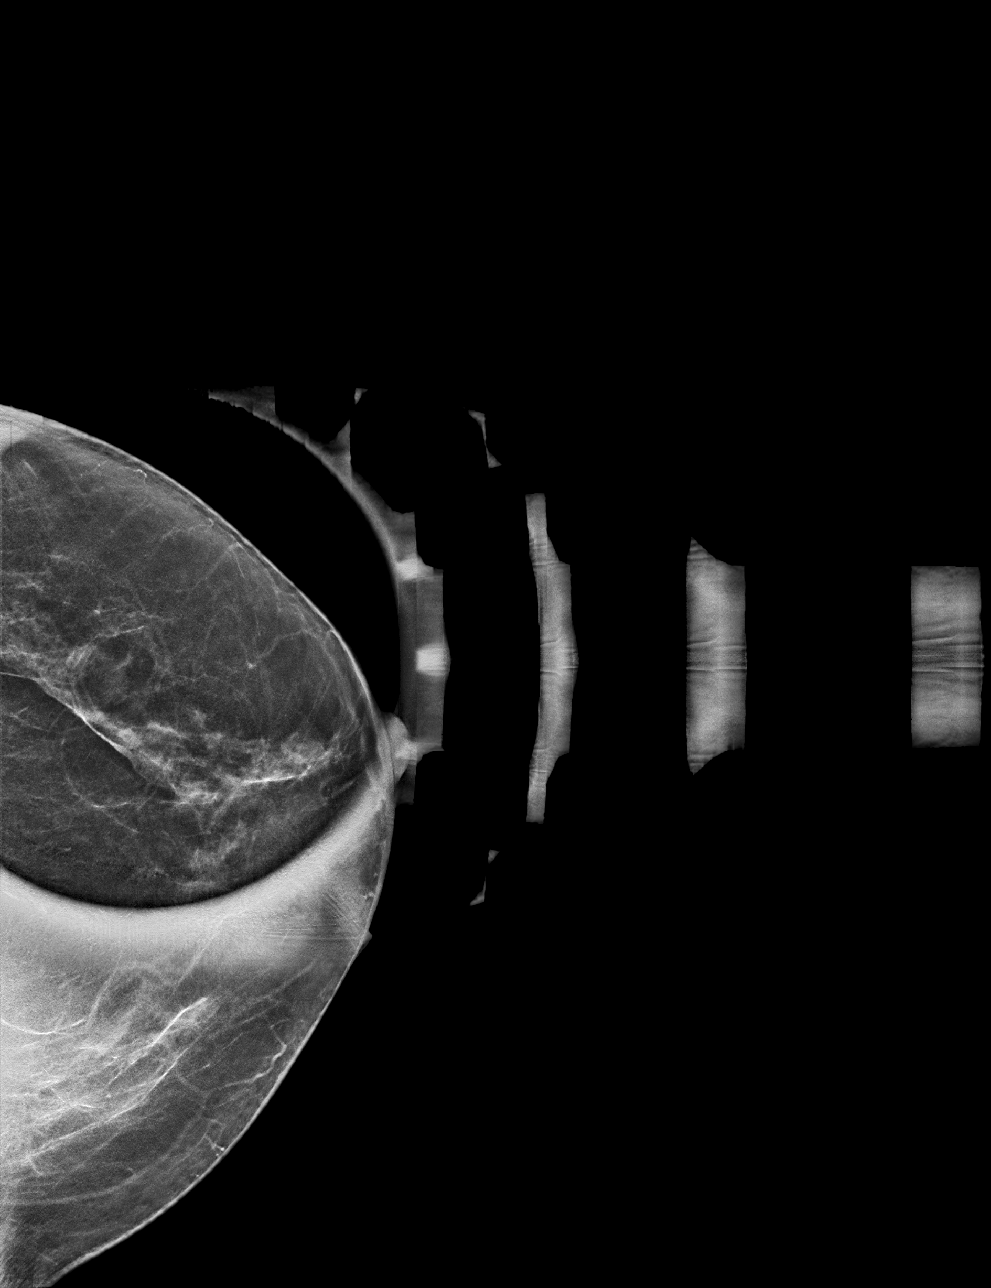

[L CC tomo · tomo slice 25/50.0]
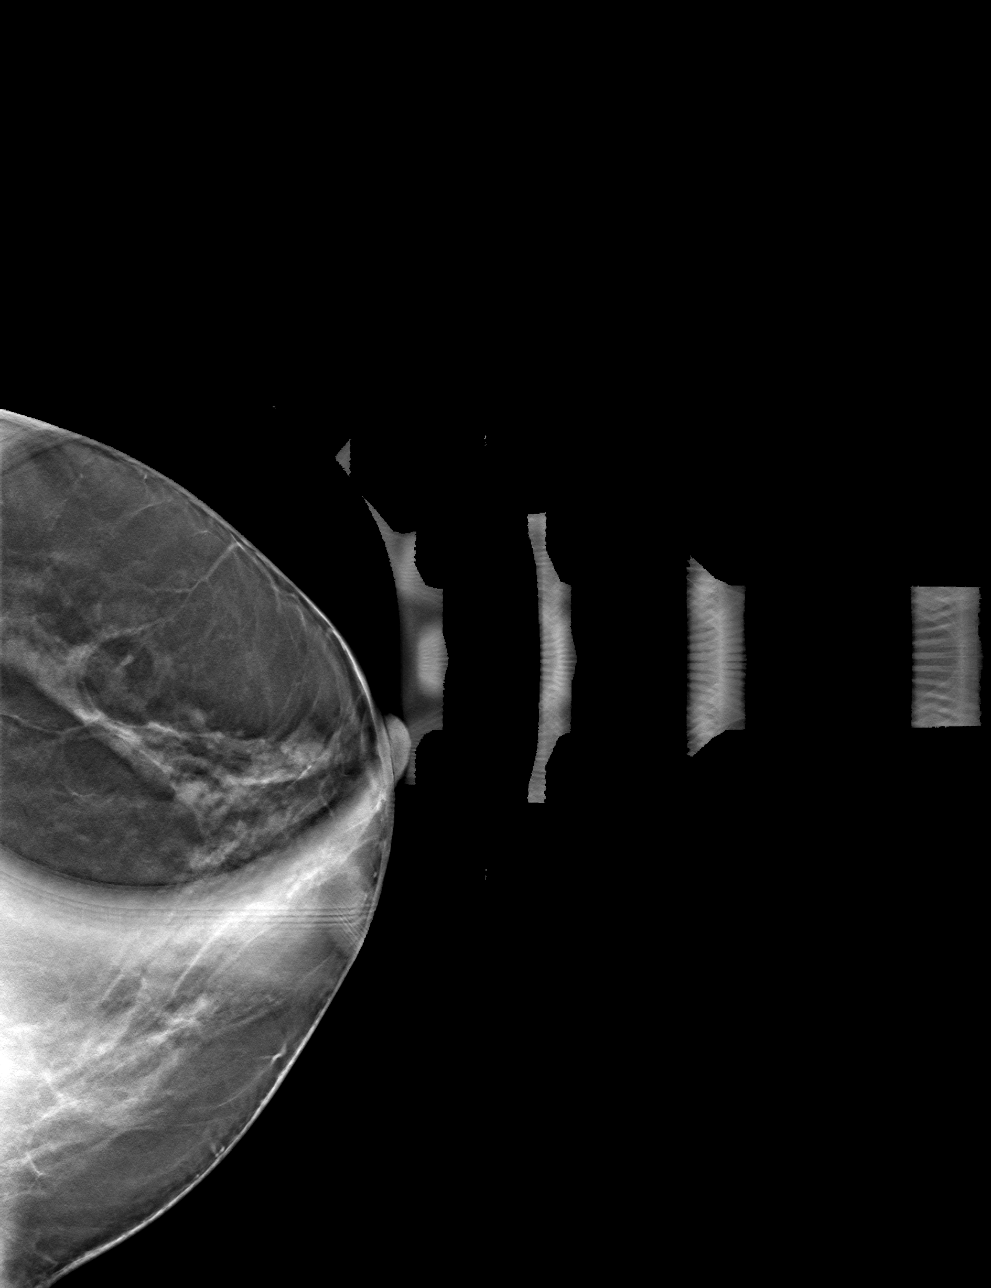

[L MLO tomo · tomo slice 24/47.0]
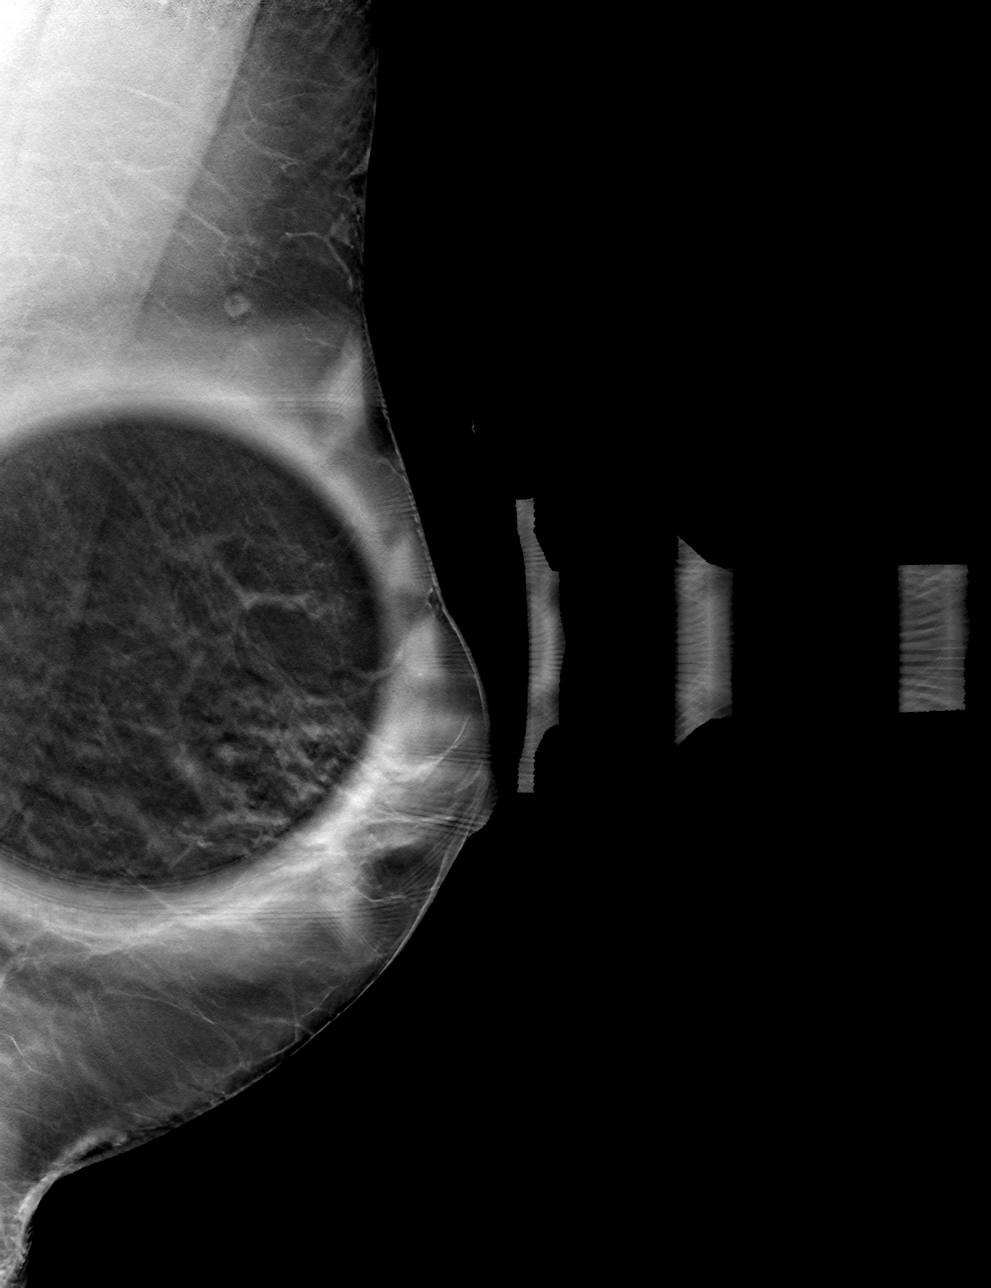

[4 of 12 positions shown; findings below may reference images not displayed]

ACR Breast Density Category b: There are scattered areas of
fibroglandular density.
FINDINGS: A low-density oval partially circumscribed and partially obscured
mass is identified in the outer left breast measuring approximately
3-4 mm.

Targeted ultrasound is performed, showing a 5 x 5 x 3 mm simple cyst
at 3 o'clock position 3 cm from the nipple thought to correspond in
size, shape, and position to the mammographically detected mass.
Additionally seen on ultrasound is a mildly complicated cyst at 3
o'clock position 5 cm from nipple measuring 5 x 3 x 4 mm at 3
o'clock position 5 cm from the nipple. No findings to suggest
malignancy.
IMPRESSION: Benign cysts in the 3 o'clock position of the left breast. No
evidence of malignancy.

RECOMMENDATION:
Screening mammogram in one year.(Code:XT-[id number])

I have discussed the findings and recommendations with the patient.
If applicable, a reminder letter will be sent to the patient
regarding the next appointment.

BI-RADS CATEGORY  2: Benign.

## 2020-08-03 ENCOUNTER — Ambulatory Visit (INDEPENDENT_AMBULATORY_CARE_PROVIDER_SITE_OTHER): Payer: Managed Care, Other (non HMO) | Admitting: Family Medicine

## 2020-08-03 ENCOUNTER — Other Ambulatory Visit: Payer: Self-pay

## 2020-08-03 ENCOUNTER — Encounter: Payer: Self-pay | Admitting: Family Medicine

## 2020-08-03 VITALS — BP 118/81 | HR 68 | Temp 97.6°F | Ht 63.0 in | Wt 134.0 lb

## 2020-08-03 DIAGNOSIS — R202 Paresthesia of skin: Secondary | ICD-10-CM

## 2020-08-03 DIAGNOSIS — J309 Allergic rhinitis, unspecified: Secondary | ICD-10-CM | POA: Diagnosis not present

## 2020-08-03 DIAGNOSIS — Z23 Encounter for immunization: Secondary | ICD-10-CM | POA: Diagnosis not present

## 2020-08-03 DIAGNOSIS — E785 Hyperlipidemia, unspecified: Secondary | ICD-10-CM

## 2020-08-03 DIAGNOSIS — R319 Hematuria, unspecified: Secondary | ICD-10-CM | POA: Diagnosis not present

## 2020-08-03 DIAGNOSIS — G43009 Migraine without aura, not intractable, without status migrainosus: Secondary | ICD-10-CM

## 2020-08-03 DIAGNOSIS — Z0000 Encounter for general adult medical examination without abnormal findings: Secondary | ICD-10-CM

## 2020-08-03 DIAGNOSIS — R8290 Unspecified abnormal findings in urine: Secondary | ICD-10-CM

## 2020-08-03 LAB — POCT WET + KOH PREP
Trich by wet prep: ABSENT
Yeast by KOH: ABSENT
Yeast by wet prep: ABSENT

## 2020-08-03 LAB — POCT URINALYSIS DIP (MANUAL ENTRY)
Bilirubin, UA: NEGATIVE
Glucose, UA: NEGATIVE mg/dL
Ketones, POC UA: NEGATIVE mg/dL
Nitrite, UA: NEGATIVE
Protein Ur, POC: NEGATIVE mg/dL
Spec Grav, UA: 1.025 (ref 1.010–1.025)
Urobilinogen, UA: 0.2 E.U./dL
pH, UA: 5 (ref 5.0–8.0)

## 2020-08-03 LAB — POC MICROSCOPIC URINALYSIS (UMFC): Mucus: ABSENT

## 2020-08-03 MED ORDER — MONTELUKAST SODIUM 10 MG PO TABS: 10.0000 mg | ORAL_TABLET | Freq: Every day | ORAL | 3 refills | Status: DC

## 2020-08-03 NOTE — Progress Notes (Signed)
10/27/20213:15 PM  Tamara Lutz 12/28/1960, 59 y.o., female [id number]  Chief Complaint  Patient presents with  . Transitions Of Care  . urine test request / strong odor    HPI:   Patient is a 59 y.o. female with past medical history significant for Osteoporosis, Migraines, and HLD who presents today for transition of care.  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 Mammogram: last 08/01/2020 Pap: due 2023? Flu: Due today Shingrix: 1st dose 10/2019 will do 2nd one today Colonoscopy: last 05/28/2018 Covid: done  LMP years ago: 8 years ago Denies vaginal bleeding  Neurology: Dr. Posey Pronto Dr. Tomi Likens. Numbness and tingling in feet.  May be small fiber neuropathy.  Podiatry: Dr. Paulla Dolly Tried gabapentin, but that did not work well Still baseline tingling in both feet  Depression screen [facility] 2/9 08/03/2020 03/25/2020 10/19/2019  Decreased Interest 0 0 0  Down, Depressed, Hopeless 0 0 0  PHQ - 2 Score 0 0 0    Fall Risk  08/03/2020 03/25/2020 11/25/2019 10/19/2019 01/16/2019  Falls in the past year? 0 0 0 0 1  Comment - - - - 11/22/2018  Number falls in past yr: 0 0 0 0 0  Injury with Fall? 0 0 0 0 1  Comment - - - - lump on left knee but much better  Follow up Falls evaluation completed - - Falls evaluation completed -     No Known Allergies  Prior to Admission medications   Medication Sig Start Date End Date Taking? Authorizing Provider  atorvastatin (LIPITOR) 10 MG tablet TAKE 1 TABLET BY MOUTH ONCE DAILY AT Amedeo Plenty 06/30/20   Rutherford Guys, MD  cetirizine (ZYRTEC) 10  MG chewable tablet Chew 10 mg by mouth daily.    [provider]  diphenhydrAMINE (BENADRYL) 25 mg capsule Take 25 mg by mouth every 6 (six) hours as needed.    [provider]  fluticasone (FLONASE) 50 MCG/ACT nasal spray Place 1 spray into both nostrils 2 (two) times daily. 02/09/19   Forrest Moron, MD  ibandronate (BONIVA) 150 MG tablet Take 150 mg by mouth every 30 (thirty) days. Take in the morning with a full glass of water, on an empty stomach, and do not take anything else by mouth or lie down for the next 30 min.    [provider]  montelukast (SINGULAIR) 10 MG tablet Take 1 tablet (10 mg total) by mouth at bedtime. 06/30/20   Rutherford Guys, MD  Multiple Vitamin (MULTIVITAMIN) tablet Take 1 tablet by mouth daily.    [provider]  OVER THE COUNTER MEDICATION Vitamin D 3 400 units, one capsule daily.    [provider]  OVER THE COUNTER MEDICATION Calcium 600 mg one capsule daily.    [provider]  rizatriptan (MAXALT) 10 MG tablet Take 1 tablet (10 mg total) by mouth as needed for migraine. May repeat in 2 hours if needed 07/28/20   Celise Bazar, Laurita Quint, FNP    Past Medical History:  Diagnosis Date  . Allergy   . Hyperlipidemia   . Osteopenia     Past Surgical  History:  Procedure Laterality Date  . CESAREAN SECTION    . CESAREAN SECTION N/A    [employer]reesia 07/31/2020  . DILATION AND CURETTAGE OF UTERUS    . MOUTH SURGERY      Social History   Tobacco Use  . Smoking status: Never Smoker  . Smokeless tobacco: Never Used  Substance Use Topics  . Alcohol use: Yes    Comment: occasionally    Family History  Problem Relation Age of Onset  . Multiple sclerosis Mother   . Uterine cancer Mother   . Alzheimer's disease Father   . Breast cancer Sister 74  . Diabetes Neg Hx   . Heart disease Neg Hx   . Hypertension Neg Hx   . Colon cancer Neg Hx   . Esophageal cancer Neg Hx   . Rectal cancer Neg Hx   . Stomach cancer  Neg Hx     Review of Systems  Constitutional: Negative for chills, fever and malaise/fatigue.  Eyes: Negative for blurred vision and double vision.  Respiratory: Negative for cough, shortness of breath and wheezing.   Cardiovascular: Negative for chest pain, palpitations and leg swelling.  Gastrointestinal: Negative for abdominal pain, blood in stool, constipation, diarrhea, heartburn, nausea and vomiting.  Genitourinary: Negative for dysuria, flank pain, frequency, hematuria and urgency.  Musculoskeletal: Negative for back pain, joint pain and myalgias.  Skin: Negative for rash.  Neurological: Positive for tingling and headaches. Negative for dizziness and weakness.     OBJECTIVE:  Today's Vitals   08/03/20 1405  BP: 118/81  Pulse: 68  Temp: 97.6 F (36.4 C)  SpO2: 98%  Weight: 134 lb (60.8 kg)  Height: 5\' 3"  (1.6 m)   Body mass index is 23.74 kg/m.   Physical Exam Constitutional:      General: She is not in acute distress.    Appearance: Normal appearance. She is not ill-appearing.  HENT:     Head: Normocephalic.  Cardiovascular:     Rate and Rhythm: Normal rate and regular rhythm.     Pulses: Normal pulses.     Heart sounds: Normal heart sounds. No murmur heard.  No friction rub. No gallop.   Pulmonary:     Effort: Pulmonary effort is normal. No respiratory distress.     Breath sounds: Normal breath sounds. No stridor. No wheezing, rhonchi or rales.  Abdominal:     General: Bowel sounds are normal.     Palpations: Abdomen is soft.     Tenderness: There is no abdominal tenderness.  Musculoskeletal:     Right lower leg: No edema.     Left lower leg: No edema.  Skin:    General: Skin is warm and dry.  Neurological:     Mental Status: She is alert and oriented to person, place, and time.  Psychiatric:        Mood and Affect: Mood normal.        Behavior: Behavior normal.     Results for orders placed or performed in visit on 08/03/20 (from the past 24  hour(s))  POCT urinalysis dipstick     Status: Abnormal   Collection Time: 08/03/20  2:38 PM  Result Value Ref Range   Color, UA yellow yellow   Clarity, UA clear clear   Glucose, UA negative negative mg/dL   Bilirubin, UA negative negative   Ketones, POC UA negative negative mg/dL   Spec Grav, UA 1.025 1.010 - 1.025   Blood, UA moderate (A) negative   pH,  UA 5.0 5.0 - 8.0   Protein Ur, POC negative negative mg/dL   Urobilinogen, UA 0.2 0.2 or 1.0 E.U./dL   Nitrite, UA Negative Negative   Leukocytes, UA Trace (A) Negative  POCT Wet + KOH Prep     Status: Abnormal   Collection Time: 08/03/20  3:06 PM  Result Value Ref Range   Yeast by KOH Absent Absent   Yeast by wet prep Absent Absent   WBC by wet prep None (A) Few   Clue Cells Wet Prep HPF POC Few (A) None   Trich by wet prep Absent Absent   Bacteria Wet Prep HPF POC Few Few   Epithelial Cells By [employer] Pref ([facility]) Few None, Few, Too numerous to count   RBC,UR,HPF,POC None None RBC/hpf    No results found.   ASSESSMENT and PLAN  Problem List Items Addressed This Visit      Cardiovascular and Mediastinum   Migraines Headaches: takes Excedrin 1st and Maxalt 2nd Maxalt: couple times per month Excedrin 3 days per week Stable and happy with current regimen.    Other Visit Diagnoses    Need for immunization against influenza    -  Primary   Relevant Orders   Flu Vaccine QUAD 36+ mos IM 2nd Shingrix vaccine done.   Allergic rhinitis, unspecified seasonality, unspecified trigger       Relevant Medications   montelukast (SINGULAIR) 10 MG tablet Flonase bid Singulair: night Zyrtec in AM Benadryl occasionally Stable on Current Regiment   Foul smelling urine       Relevant Orders   POCT urinalysis dipstick (Completed)    POCT Wet + KOH Prep   Annual physical exam       Relevant Orders   Comprehensive metabolic panel   CBC   Lipid Panel   TSH   Vitamin D, 25-hydroxy   Hemoglobin A1c   Dyslipidemia       Atorvastatin 10mg  daily Denies myalgias, stable on current regiment, will follow up with lab results.   Paresthesia     Managed by Neurology   Hematuria, unspecified type       Relevant Orders   POCT Microscopic Urinalysis ([facility]) Will follow up with lab results Recheck in 2 months, if blood remains refer to Urology.       Return in about 2 months (around 10/03/2020) for follow up on blood in urine.    Huston Foley Chaslyn Eisen, FNP-BC [facility] [place], [place] [address] Ph.  [phone] Fax [phone]

## 2020-08-03 NOTE — Patient Instructions (Addendum)
Medication refills sent Will follow up with lab results. Will follow up in 2 months to recheck urine.  Health Maintenance, Female Adopting a healthy lifestyle and getting preventive care are important in promoting health and wellness. Ask your health care provider about:  The right schedule for you to have regular tests and exams.  Things you can do on your own to prevent diseases and keep yourself healthy. What should I know about diet, weight, and exercise? Eat a healthy diet   Eat a diet that includes plenty of vegetables, fruits, low-fat dairy products, and lean protein.  Do not eat a lot of foods that are high in solid fats, added sugars, or sodium. Maintain a healthy weight Body mass index (BMI) is used to identify weight problems. It estimates body fat based on height and weight. Your health care provider can help determine your BMI and help you achieve or maintain a healthy weight. Get regular exercise Get regular exercise. This is one of the most important things you can do for your health. Most adults should:  Exercise for at least 150 minutes each week. The exercise should increase your heart rate and make you sweat (moderate-intensity exercise).  Do strengthening exercises at least twice a week. This is in addition to the moderate-intensity exercise.  Spend less time sitting. Even light physical activity can be beneficial. Watch cholesterol and blood lipids Have your blood tested for lipids and cholesterol at 58 years of age, then have this test every 5 years. Have your cholesterol levels checked more often if:  Your lipid or cholesterol levels are high.  You are older than 58 years of age.  You are at high risk for heart disease. What should I know about cancer screening? Depending on your health history and family history, you may need to have cancer screening at various ages. This may include screening for:  Breast cancer.  Cervical cancer.  Colorectal  cancer.  Skin cancer.  Lung cancer. What should I know about heart disease, diabetes, and high blood pressure? Blood pressure and heart disease  High blood pressure causes heart disease and increases the risk of stroke. This is more likely to develop in people who have high blood pressure readings, are of African descent, or are overweight.  Have your blood pressure checked: ? Every 3-5 years if you are 26-51 years of age. ? Every year if you are 27 years old or older. Diabetes Have regular diabetes screenings. This checks your fasting blood sugar level. Have the screening done:  Once every three years after age 42 if you are at a normal weight and have a low risk for diabetes.  More often and at a younger age if you are overweight or have a high risk for diabetes. What should I know about preventing infection? Hepatitis B If you have a higher risk for hepatitis B, you should be screened for this virus. Talk with your health care provider to find out if you are at risk for hepatitis B infection. Hepatitis C Testing is recommended for:  Everyone born from [birth year] through 1964.  Anyone with known risk factors for hepatitis C. Sexually transmitted infections (STIs)  Get screened for STIs, including gonorrhea and chlamydia, if: ? You are sexually active and are younger than 58 years of age. ? You are older than 58 years of age and your health care provider tells you that you are at risk for this type of infection. ? Your sexual activity has changed since you  were last screened, and you are at increased risk for chlamydia or gonorrhea. Ask your health care provider if you are at risk.  Ask your health care provider about whether you are at high risk for HIV. Your health care provider may recommend a prescription medicine to help prevent HIV infection. If you choose to take medicine to prevent HIV, you should first get tested for HIV. You should then be tested every 3 months for as long as  you are taking the medicine. Pregnancy  If you are about to stop having your period (premenopausal) and you may become pregnant, seek counseling before you get pregnant.  Take 400 to 800 micrograms (mcg) of folic acid every day if you become pregnant.  Ask for birth control (contraception) if you want to prevent pregnancy. Osteoporosis and menopause Osteoporosis is a disease in which the bones lose minerals and strength with aging. This can result in bone fractures. If you are 89 years old or older, or if you are at risk for osteoporosis and fractures, ask your health care provider if you should:  Be screened for bone loss.  Take a calcium or vitamin D supplement to lower your risk of fractures.  Be given hormone replacement therapy (HRT) to treat symptoms of menopause. Follow these instructions at home: Lifestyle  Do not use any products that contain nicotine or tobacco, such as cigarettes, e-cigarettes, and chewing tobacco. If you need help quitting, ask your health care provider.  Do not use street drugs.  Do not share needles.  Ask your health care provider for help if you need support or information about quitting drugs. Alcohol use  Do not drink alcohol if: ? Your health care provider tells you not to drink. ? You are pregnant, may be pregnant, or are planning to become pregnant.  If you drink alcohol: ? Limit how much you use to 0-1 drink a day. ? Limit intake if you are breastfeeding.  Be aware of how much alcohol is in your drink. In the [place] one drink equals one 12 oz bottle of beer (355 mL), one 5 oz glass of wine (148 mL), or one 1 oz glass of hard liquor (44 mL). General instructions  Schedule regular health, dental, and eye exams.  Stay current with your vaccines.  Tell your health care provider if: ? You often feel depressed. ? You have ever been abused or do not feel safe at home. Summary  Adopting a healthy lifestyle and getting preventive care are  important in promoting health and wellness.  Follow your health care provider's instructions about healthy diet, exercising, and getting tested or screened for diseases.  Follow your health care provider's instructions on monitoring your cholesterol and blood pressure. This information is not intended to replace advice given to you by your health care provider. Make sure you discuss any questions you have with your health care provider. Document Revised: 09/17/2018 Document Reviewed: 09/17/2018 Elsevier Patient Education  [employer].    If you have lab work done today you will be contacted with your lab results within the next 2 weeks.  If you have not heard from [place] then please contact us. The fastest way to get your results is to register for My Chart.   IF you received an x-ray today, you will receive an invoice from [facility]. Please contact [facility] at [phone] with questions or concerns regarding your invoice.   IF you received labwork today, you will receive an invoice from  [facility]. Please contact [facility] at [phone] with questions or concerns regarding your invoice.   Our billing staff will not be able to assist you with questions regarding bills from these companies.  You will be contacted with the lab results as soon as they are available. The fastest way to get your results is to activate your My Chart account. Instructions are located on the last page of this paperwork. If you have not heard from [place] regarding the results in 2 weeks, please contact this office.

## 2020-08-04 LAB — CBC
Hematocrit: 40.9 % (ref 34.0–46.6)
Hemoglobin: 13.7 g/dL (ref 11.1–15.9)
MCH: 29.7 pg (ref 26.6–33.0)
MCHC: 33.5 g/dL (ref 31.5–35.7)
MCV: 89 fL (ref 79–97)
Platelets: 251 10*3/uL (ref 150–450)
RBC: 4.61 x10E6/uL (ref 3.77–5.28)
RDW: 12 % (ref 11.7–15.4)
WBC: 5.7 10*3/uL (ref 3.4–10.8)

## 2020-08-04 LAB — COMPREHENSIVE METABOLIC PANEL
ALT: 14 IU/L (ref 0–32)
AST: 22 IU/L (ref 0–40)
Albumin/Globulin Ratio: 1.6 (ref 1.2–2.2)
Albumin: 4.6 g/dL (ref 3.8–4.9)
Alkaline Phosphatase: 70 IU/L (ref 44–121)
BUN/Creatinine Ratio: 28 — ABNORMAL HIGH (ref 9–23)
BUN: 27 mg/dL — ABNORMAL HIGH (ref 6–24)
Bilirubin Total: 0.3 mg/dL (ref 0.0–1.2)
CO2: 26 mmol/L (ref 20–29)
Calcium: 10.2 mg/dL (ref 8.7–10.2)
Chloride: 100 mmol/L (ref 96–106)
Creatinine, Ser: 0.95 mg/dL (ref 0.57–1.00)
GFR calc Af Amer: 76 mL/min/{1.73_m2} (ref >59)
GFR calc non Af Amer: 66 mL/min/{1.73_m2} (ref >59)
Globulin, Total: 2.8 g/dL (ref 1.5–4.5)
Glucose: 78 mg/dL (ref 65–99)
Potassium: 4.1 mmol/L (ref 3.5–5.2)
Sodium: 140 mmol/L (ref 134–144)
Total Protein: 7.4 g/dL (ref 6.0–8.5)

## 2020-08-04 LAB — HEMOGLOBIN A1C
Est. average glucose Bld gHb Est-mCnc: 105 mg/dL
Hgb A1c MFr Bld: 5.3 % (ref 4.8–5.6)

## 2020-08-04 LAB — LIPID PANEL
Chol/HDL Ratio: 3.4 ratio (ref 0.0–4.4)
Cholesterol, Total: 201 mg/dL — ABNORMAL HIGH (ref 100–199)
HDL: 59 mg/dL (ref >39)
LDL Chol Calc (NIH): 131 mg/dL — ABNORMAL HIGH (ref 0–99)
Triglycerides: 62 mg/dL (ref 0–149)
VLDL Cholesterol Cal: 11 mg/dL (ref 5–40)

## 2020-08-04 LAB — VITAMIN D 25 HYDROXY (VIT D DEFICIENCY, FRACTURES): Vit D, 25-Hydroxy: 51.2 ng/mL (ref 30.0–100.0)

## 2020-08-04 LAB — TSH: TSH: 2.18 u[IU]/mL (ref 0.450–4.500)

## 2020-08-22 ENCOUNTER — Other Ambulatory Visit: Payer: Self-pay

## 2020-08-22 ENCOUNTER — Encounter: Payer: Self-pay | Admitting: Family Medicine

## 2020-08-22 ENCOUNTER — Ambulatory Visit (INDEPENDENT_AMBULATORY_CARE_PROVIDER_SITE_OTHER): Payer: Managed Care, Other (non HMO) | Admitting: Family Medicine

## 2020-08-22 VITALS — BP 123/87 | HR 71 | Temp 98.4°F | Ht 63.0 in | Wt 135.0 lb

## 2020-08-22 DIAGNOSIS — E785 Hyperlipidemia, unspecified: Secondary | ICD-10-CM

## 2020-08-22 DIAGNOSIS — R319 Hematuria, unspecified: Secondary | ICD-10-CM | POA: Diagnosis not present

## 2020-08-22 LAB — POCT URINALYSIS DIP (MANUAL ENTRY)
Bilirubin, UA: NEGATIVE
Glucose, UA: NEGATIVE mg/dL
Ketones, POC UA: NEGATIVE mg/dL
Nitrite, UA: POSITIVE — AB
Protein Ur, POC: NEGATIVE mg/dL
Spec Grav, UA: 1.025 (ref 1.010–1.025)
Urobilinogen, UA: 0.2 E.U./dL
pH, UA: 5 (ref 5.0–8.0)

## 2020-08-22 LAB — POC MICROSCOPIC URINALYSIS (UMFC)

## 2020-08-22 MED ORDER — ATORVASTATIN CALCIUM 20 MG PO TABS: ORAL_TABLET | ORAL | 3 refills | Status: DC

## 2020-08-22 NOTE — Patient Instructions (Addendum)
Hematuria, Adult Hematuria is blood in the urine. Blood may be visible in the urine, or it may be identified with a test. This condition can be caused by infections of the bladder, urethra, kidney, or prostate. Other possible causes include:  Kidney stones.  Cancer of the urinary tract.  Too much calcium in the urine.  Conditions that are passed from parent to child (inherited conditions).  Exercise that requires a lot of energy. Infections can usually be treated with medicine, and a kidney stone usually will pass through your urine. If neither of these is the cause of your hematuria, more tests may be needed to identify the cause of your symptoms. It is very important to tell your health care provider about any blood in your urine, even if it is painless or the blood stops without treatment. Blood in the urine, when it happens and then stops and then happens again, can be a symptom of a very serious condition, including cancer. There is no pain in the initial stages of many urinary cancers. Follow these instructions at home: Medicines  Take over-the-counter and prescription medicines only as told by your health care provider.  If you were prescribed an antibiotic medicine, take it as told by your health care provider. Do not stop taking the antibiotic even if you start to feel better. Eating and drinking  Drink enough fluid to keep your urine clear or pale yellow. It is recommended that you drink 3-4 quarts (2.8-3.8 L) a day. If you have been diagnosed with an infection, it is recommended that you drink cranberry juice in addition to large amounts of water.  Avoid caffeine, tea, and carbonated beverages. These tend to irritate the bladder.  Avoid alcohol because it may irritate the prostate (men). General instructions  If you have been diagnosed with a kidney stone, follow your health care provider's instructions about straining your urine to catch the stone.  Empty your bladder  often. Avoid holding urine for long periods of time.  If you are female: ? After a bowel movement, wipe from front to back and use each piece of toilet paper only once. ? Empty your bladder before and after sex.  Pay attention to any changes in your symptoms. Tell your health care provider about any changes or any new symptoms.  It is your responsibility to get your test results. Ask your health care provider, or the department performing the test, when your results will be ready.  Keep all follow-up visits as told by your health care provider. This is important. Contact a health care provider if:  You develop back pain.  You have a fever.  You have nausea or vomiting.  Your symptoms do not improve after 3 days.  Your symptoms get worse. Get help right away if:  You develop severe vomiting and are unable take medicine without vomiting.  You develop severe pain in your back or abdomen even though you are taking medicine.  You pass a large amount of blood in your urine.  You pass blood clots in your urine.  You feel very weak or like you might faint.  You faint. Summary  Hematuria is blood in the urine. It has many possible causes.  It is very important that you tell your health care provider about any blood in your urine, even if it is painless or the blood stops without treatment.  Take over-the-counter and prescription medicines only as told by your health care provider.  Drink enough fluid to keep  your urine clear or pale yellow. This information is not intended to replace advice given to you by your health care provider. Make sure you discuss any questions you have with your health care provider. Document Revised: 02/18/2019 Document Reviewed: 10/27/2016 Elsevier Patient Education  [employer].  If you have lab work done today you will be contacted with your lab results within the next 2 weeks.  If you have not heard from [place] then please contact us. The  fastest way to get your results is to register for My Chart.   IF you received an x-ray today, you will receive an invoice from [facility]. Please contact [facility] at [phone] with questions or concerns regarding your invoice.   IF you received labwork today, you will receive an invoice from [facility]. Please contact [facility] at [phone] with questions or concerns regarding your invoice.   Our billing staff will not be able to assist you with questions regarding bills from these companies.  You will be contacted with the lab results as soon as they are available. The fastest way to get your results is to activate your My Chart account. Instructions are located on the last page of this paperwork. If you have not heard from [place] regarding the results in 2 weeks, please contact this office.

## 2020-08-22 NOTE — Progress Notes (Signed)
11/15/20213:25 PM  [name] 1961-09-30, 59 y.o., female [id number]  Chief Complaint  Patient presents with  . follow up labs    HPI:   Patient is a 59 y.o. female with past medical history significant for osteoporosis, migraines, and HLD who presents today for follow-up of hematuria.  LMP years ago: 8 years ago Denies vaginal bleeding Denies urinary symptoms Denies noticeable blood in urine Hematuria last visit on dipstick    Hld Atorvastatin $RemoveBeforeDE'10mg'lzNjhOGqXJJNvnU$  Increase to $RemoveBef'20mg'FthPsvOhwW$   Lab Results  Component Value Date   CHOL 201 (H) 08/03/2020   HDL 59 08/03/2020   LDLCALC 131 (H) 08/03/2020   TRIG 62 08/03/2020   CHOLHDL 3.4 08/03/2020    The 10-year ASCVD risk score Mikey Bussing DC Jr., et al., 2013) is: 2.4%   Values used to calculate the score:     Age: 39 years     Sex: Female     Is Non-Hispanic African American: No     Diabetic: No     Tobacco smoker: No     Systolic Blood Pressure: 284 mmHg     Is BP treated: No     HDL Cholesterol: 59 mg/dL     Total Cholesterol: 201 mg/dL    Depression screen [facility] 2/9 08/22/2020 08/03/2020 03/25/2020  Decreased Interest 0 0 0  Down, Depressed, Hopeless 0 0 0  PHQ - 2 Score 0 0 0    Fall Risk  08/22/2020 08/03/2020 03/25/2020 11/25/2019 10/19/2019  Falls in the past year? 0 0 0 0 0  Comment - - - - -  Number falls in past yr: 0 0 0 0 0  Injury with Fall? 0 0 0 0 0  Comment - - - - -  Follow up Falls evaluation completed Falls evaluation completed - - Falls evaluation completed     No Known Allergies  Prior to Admission medications   Medication Sig Start Date End Date Taking? Authorizing Provider  atorvastatin (LIPITOR) 10 MG tablet TAKE 1 TABLET BY MOUTH ONCE DAILY AT 6PM 06/30/20  Yes Rutherford Guys, MD  cetirizine (ZYRTEC) 10 MG chewable tablet Chew 10 mg by mouth daily.   Yes [provider]  diphenhydrAMINE (BENADRYL) 25 mg capsule Take 25 mg by mouth every 6 (six) hours as needed.   Yes [provider]  fluticasone (FLONASE) 50 MCG/ACT nasal spray Place 1 spray into both nostrils 2 (two) times daily. 02/09/19  Yes Stallings, Zoe A, MD  ibandronate (BONIVA) 150 MG tablet Take 150 mg by mouth every 30 (thirty) days. Take in the morning with a full glass of water, on an empty stomach, and do not take anything else by mouth or lie down for the next 30 min.   Yes [provider]  montelukast (SINGULAIR) 10 MG tablet Take 1 tablet (10 mg total) by mouth at bedtime. 08/03/20  Yes Dann Ventress, Laurita Quint, FNP  Multiple Vitamin (MULTIVITAMIN) tablet Take 1 tablet by mouth daily.   Yes [provider]  OVER THE COUNTER MEDICATION Vitamin D 3 400 units, one capsule daily.   Yes [provider]  OVER THE COUNTER MEDICATION Calcium 600 mg one capsule daily.   Yes [provider]  rizatriptan (MAXALT) 10 MG tablet Take 1 tablet (10 mg total) by mouth as needed for migraine. May repeat in 2 hours if needed 07/28/20  Yes Cordero Surette, Laurita Quint, FNP    Past Medical History:  Diagnosis Date  . Allergy   . Hyperlipidemia   .  Osteopenia     Past Surgical History:  Procedure Laterality Date  . CESAREAN SECTION    . CESAREAN SECTION N/A    Phreesia 07/31/2020  . DILATION AND CURETTAGE OF UTERUS    . MOUTH SURGERY      Social History   Tobacco Use  . Smoking status: Never Smoker  . Smokeless tobacco: Never Used  Substance Use Topics  . Alcohol use: Yes    Comment: occasionally    Family History  Problem Relation Age of Onset  . Multiple sclerosis Mother   . Uterine cancer Mother   . Alzheimer's disease Father   . Breast cancer Sister 17  . Diabetes Neg Hx   . Heart disease Neg Hx   . Hypertension Neg Hx   . Colon cancer Neg Hx   . Esophageal cancer Neg Hx   . Rectal cancer Neg Hx   . Stomach cancer Neg Hx     Review of Systems  Constitutional: Negative for chills, fever and malaise/fatigue.  Respiratory: Negative for cough, shortness of breath and wheezing.    Cardiovascular: Negative for chest pain, palpitations and leg swelling.  Gastrointestinal: Negative for abdominal pain, blood in stool, constipation, diarrhea, heartburn, nausea and vomiting.  Genitourinary: Negative for dysuria, flank pain, frequency, hematuria and urgency.  Musculoskeletal: Negative for back pain, joint pain and myalgias.  Neurological: Negative for dizziness and headaches.     OBJECTIVE:  Today's Vitals   08/22/20 1450  BP: 123/87  Pulse: 71  Temp: 98.4 F (36.9 C)  SpO2: 95%  Weight: 135 lb (61.2 kg)  Height: 5' 3"  (1.6 m)   Body mass index is 23.91 kg/m.   Physical Exam Constitutional:      General: She is not in acute distress.    Appearance: Normal appearance. She is not ill-appearing.  HENT:     Head: Normocephalic.  Cardiovascular:     Rate and Rhythm: Normal rate and regular rhythm.     Pulses: Normal pulses.     Heart sounds: Normal heart sounds. No murmur heard.  No friction rub. No gallop.   Pulmonary:     Effort: Pulmonary effort is normal. No respiratory distress.     Breath sounds: Normal breath sounds. No stridor. No wheezing, rhonchi or rales.  Abdominal:     General: Bowel sounds are normal. There is no distension.     Palpations: Abdomen is soft.     Tenderness: There is no abdominal tenderness. There is no right CVA tenderness or left CVA tenderness.  Musculoskeletal:     Right lower leg: No edema.     Left lower leg: No edema.  Skin:    General: Skin is warm and dry.  Neurological:     Mental Status: She is alert and oriented to person, place, and time.  Psychiatric:        Mood and Affect: Mood normal.        Behavior: Behavior normal.     Results for orders placed or performed in visit on 08/22/20 (from the past 24 hour(s))  POCT urinalysis dipstick     Status: Abnormal   Collection Time: 08/22/20  3:05 PM  Result Value Ref Range   Color, UA yellow yellow   Clarity, UA clear clear   Glucose, UA negative negative  mg/dL   Bilirubin, UA negative negative   Ketones, POC UA negative negative mg/dL   Spec Grav, UA 1.025 1.010 - 1.025   Blood, UA moderate (A) negative  pH, UA 5.0 5.0 - 8.0   Protein Ur, POC negative negative mg/dL   Urobilinogen, UA 0.2 0.2 or 1.0 E.U./dL   Nitrite, UA Positive (A) Negative   Leukocytes, UA Trace (A) Negative  POCT Microscopic Urinalysis ([facility])     Status: Abnormal   Collection Time: 08/22/20  3:19 PM  Result Value Ref Range   WBC,UR,HPF,POC Few (A) None WBC/hpf   RBC,UR,HPF,POC None None RBC/hpf   Bacteria Many (A) None, Too numerous to count   Mucus Present (A) Absent   Epithelial Cells, UR Per Microscopy Few (A) None, Too numerous to count cells/hpf    No results found.   ASSESSMENT and PLAN  Problem List Items Addressed This Visit    None    Visit Diagnoses    Hematuria, unspecified type    -  Primary   Relevant Orders   POCT Microscopic Urinalysis ([facility]) (Completed)   CMP14+EGFR   POCT urinalysis dipstick (Completed) Declined to start antibiotics at this time, will re-evaluate once urine culture results come back   Urine Culture: will follow up with results   Ambulatory referral to Urology: hematuria x2 visits Discussed referral and hematuria indications   Dyslipidemia       Relevant Medications   atorvastatin (LIPITOR) 20 MG tablet Increased for LDL goal< 100      Return in about 6 months (around 02/19/2021).   Huston Foley Dene Landsberg, FNP-BC [facility] [place], [place] [address] Ph.  [phone] Fax [phone]

## 2020-08-23 LAB — CMP14+EGFR
ALT: 19 IU/L (ref 0–32)
AST: 24 IU/L (ref 0–40)
Albumin/Globulin Ratio: 1.8 (ref 1.2–2.2)
Albumin: 4.8 g/dL (ref 3.8–4.9)
Alkaline Phosphatase: 76 IU/L (ref 44–121)
BUN/Creatinine Ratio: 26 — ABNORMAL HIGH (ref 9–23)
BUN: 21 mg/dL (ref 6–24)
Bilirubin Total: 0.3 mg/dL (ref 0.0–1.2)
CO2: 26 mmol/L (ref 20–29)
Calcium: 11 mg/dL — ABNORMAL HIGH (ref 8.7–10.2)
Chloride: 102 mmol/L (ref 96–106)
Creatinine, Ser: 0.82 mg/dL (ref 0.57–1.00)
GFR calc Af Amer: 91 mL/min/{1.73_m2} (ref >59)
GFR calc non Af Amer: 79 mL/min/{1.73_m2} (ref >59)
Globulin, Total: 2.7 g/dL (ref 1.5–4.5)
Glucose: 83 mg/dL (ref 65–99)
Potassium: 4.1 mmol/L (ref 3.5–5.2)
Sodium: 141 mmol/L (ref 134–144)
Total Protein: 7.5 g/dL (ref 6.0–8.5)

## 2020-08-24 ENCOUNTER — Other Ambulatory Visit: Payer: Self-pay | Admitting: Family Medicine

## 2020-08-24 DIAGNOSIS — N3001 Acute cystitis with hematuria: Secondary | ICD-10-CM

## 2020-08-24 MED ORDER — NITROFURANTOIN MONOHYD MACRO 100 MG PO CAPS: 100.0000 mg | ORAL_CAPSULE | Freq: Two times a day (BID) | ORAL | 0 refills | Status: AC

## 2020-08-27 LAB — URINE CULTURE

## 2020-08-29 ENCOUNTER — Encounter: Payer: Self-pay | Admitting: Family Medicine

## 2020-10-06 ENCOUNTER — Ambulatory Visit: Payer: Managed Care, Other (non HMO) | Admitting: Family Medicine

## 2020-10-07 ENCOUNTER — Other Ambulatory Visit: Payer: Self-pay | Admitting: Family Medicine

## 2020-10-07 NOTE — Telephone Encounter (Signed)
Medication discussed in October, Maxalt refilled.

## 2020-10-13 ENCOUNTER — Other Ambulatory Visit: Payer: Self-pay

## 2020-10-13 ENCOUNTER — Ambulatory Visit (INDEPENDENT_AMBULATORY_CARE_PROVIDER_SITE_OTHER): Payer: Managed Care, Other (non HMO) | Admitting: Family Medicine

## 2020-10-13 ENCOUNTER — Encounter: Payer: Self-pay | Admitting: Family Medicine

## 2020-10-13 VITALS — BP 137/84 | HR 75 | Temp 98.3°F | Ht 63.0 in | Wt 136.0 lb

## 2020-10-13 DIAGNOSIS — R319 Hematuria, unspecified: Secondary | ICD-10-CM

## 2020-10-13 LAB — POCT URINALYSIS DIP (MANUAL ENTRY)
Bilirubin, UA: NEGATIVE
Blood, UA: NEGATIVE
Glucose, UA: NEGATIVE mg/dL
Ketones, POC UA: NEGATIVE mg/dL
Leukocytes, UA: NEGATIVE
Nitrite, UA: NEGATIVE
Protein Ur, POC: NEGATIVE mg/dL
Spec Grav, UA: <=1.005 — AB (ref 1.010–1.025)
Urobilinogen, UA: 0.2 E.U./dL
pH, UA: 6 (ref 5.0–8.0)

## 2020-10-13 LAB — POC MICROSCOPIC URINALYSIS (UMFC): Mucus: ABSENT

## 2020-10-13 NOTE — Patient Instructions (Addendum)
Health Maintenance, Female Adopting a healthy lifestyle and getting preventive care are important in promoting health and wellness. Ask your health care provider about:  The right schedule for you to have regular tests and exams.  Things you can do on your own to prevent diseases and keep yourself healthy. What should I know about diet, weight, and exercise? Eat a healthy diet   Eat a diet that includes plenty of vegetables, fruits, low-fat dairy products, and lean protein.  Do not eat a lot of foods that are high in solid fats, added sugars, or sodium. Maintain a healthy weight Body mass index (BMI) is used to identify weight problems. It estimates body fat based on height and weight. Your health care provider can help determine your BMI and help you achieve or maintain a healthy weight. Get regular exercise Get regular exercise. This is one of the most important things you can do for your health. Most adults should:  Exercise for at least 150 minutes each week. The exercise should increase your heart rate and make you sweat (moderate-intensity exercise).  Do strengthening exercises at least twice a week. This is in addition to the moderate-intensity exercise.  Spend less time sitting. Even light physical activity can be beneficial. Watch cholesterol and blood lipids Have your blood tested for lipids and cholesterol at 59 years of age, then have this test every 5 years. Have your cholesterol levels checked more often if:  Your lipid or cholesterol levels are high.  You are older than 59 years of age.  You are at high risk for heart disease. What should I know about cancer screening? Depending on your health history and family history, you may need to have cancer screening at various ages. This may include screening for:  Breast cancer.  Cervical cancer.  Colorectal cancer.  Skin cancer.  Lung cancer. What should I know about heart disease, diabetes, and high blood  pressure? Blood pressure and heart disease  High blood pressure causes heart disease and increases the risk of stroke. This is more likely to develop in people who have high blood pressure readings, are of African descent, or are overweight.  Have your blood pressure checked: ? Every 3-5 years if you are 54-9 years of age. ? Every year if you are 69 years old or older. Diabetes Have regular diabetes screenings. This checks your fasting blood sugar level. Have the screening done:  Once every three years after age 36 if you are at a normal weight and have a low risk for diabetes.  More often and at a younger age if you are overweight or have a high risk for diabetes. What should I know about preventing infection? Hepatitis B If you have a higher risk for hepatitis B, you should be screened for this virus. Talk with your health care provider to find out if you are at risk for hepatitis B infection. Hepatitis C Testing is recommended for:  Everyone born from [birth year] through 1964.  Anyone with known risk factors for hepatitis C. Sexually transmitted infections (STIs)  Get screened for STIs, including gonorrhea and chlamydia, if: ? You are sexually active and are younger than 59 years of age. ? You are older than 59 years of age and your health care provider tells you that you are at risk for this type of infection. ? Your sexual activity has changed since you were last screened, and you are at increased risk for chlamydia or gonorrhea. Ask your health care provider  provider if you are at risk.  Ask your health care provider about whether you are at high risk for HIV. Your health care provider may recommend a prescription medicine to help prevent HIV infection. If you choose to take medicine to prevent HIV, you should first get tested for HIV. You should then be tested every 3 months for as long as you are taking the medicine. Pregnancy  If you are about to stop having your period (premenopausal) and  you may become pregnant, seek counseling before you get pregnant.  Take 400 to 800 micrograms (mcg) of folic acid every day if you become pregnant.  Ask for birth control (contraception) if you want to prevent pregnancy. Osteoporosis and menopause Osteoporosis is a disease in which the bones lose minerals and strength with aging. This can result in bone fractures. If you are 65 years old or older, or if you are at risk for osteoporosis and fractures, ask your health care provider if you should:  Be screened for bone loss.  Take a calcium or vitamin D supplement to lower your risk of fractures.  Be given hormone replacement therapy (HRT) to treat symptoms of menopause. Follow these instructions at home: Lifestyle  Do not use any products that contain nicotine or tobacco, such as cigarettes, e-cigarettes, and chewing tobacco. If you need help quitting, ask your health care provider.  Do not use street drugs.  Do not share needles.  Ask your health care provider for help if you need support or information about quitting drugs. Alcohol use  Do not drink alcohol if: ? Your health care provider tells you not to drink. ? You are pregnant, may be pregnant, or are planning to become pregnant.  If you drink alcohol: ? Limit how much you use to 0-1 drink a day. ? Limit intake if you are breastfeeding.  Be aware of how much alcohol is in your drink. In the [place] one drink equals one 12 oz bottle of beer (355 mL), one 5 oz glass of wine (148 mL), or one 1 oz glass of hard liquor (44 mL). General instructions  Schedule regular health, dental, and eye exams.  Stay current with your vaccines.  Tell your health care provider if: ? You often feel depressed. ? You have ever been abused or do not feel safe at home. Summary  Adopting a healthy lifestyle and getting preventive care are important in promoting health and wellness.  Follow your health care provider's instructions about healthy  diet, exercising, and getting tested or screened for diseases.  Follow your health care provider's instructions on monitoring your cholesterol and blood pressure. This information is not intended to replace advice given to you by your health care provider. Make sure you discuss any questions you have with your health care provider. Document Revised: 09/17/2018 Document Reviewed: 09/17/2018 Elsevier Patient Education  2020 [employer]vier [employer]   If you have lab work done today you will be contacted with your lab results within the next 2 weeks.  If you have not heard from us then please contact us. The fastest way to get your results is to register for My Chart.   IF you received an x-ray today, you will receive an invoice from [facility]. Please contact [facility] at [phone] with questions or concerns regarding your invoice.   IF you received labwork today, you will receive an invoice from [facility]. Please contact [facility] at [phone] with questions or concerns regarding your invoice.   Our billing staff   not be able to assist you with questions regarding bills from these companies.  You will be contacted with the lab results as soon as they are available. The fastest way to get your results is to activate your My Chart account. Instructions are located on the last page of this paperwork. If you have not heard from [place] regarding the results in 2 weeks, please contact this office.

## 2020-10-13 NOTE — Progress Notes (Signed)
1/6/20221:53 PM  Tamara Lutz 10/26/1960, 60 y.o., female [id number]  Chief Complaint  Patient presents with  . referral follow up     Patient did not get contacted by the referral team , and would like re- evaluation for urology referral     HPI:   Patient is a 60 y.o. female with past medical history significant for migraines, HLD, osteoporosis who presents today for referral follow up.  Previous issues for hematuria Treated for UTI Never received call from Urology Denies all symptoms No acute issues at this time  Will continue to monitor BP  BP Readings from Last 3 Encounters:  10/13/20 137/84  08/22/20 123/87  08/03/20 118/81      Depression screen PHQ 2/9 10/13/2020 08/22/2020 08/03/2020  Decreased Interest 0 0 0  Down, Depressed, Hopeless 0 0 0  PHQ - 2 Score 0 0 0    Fall Risk  10/13/2020 08/22/2020 08/03/2020 03/25/2020 11/25/2019  Falls in the past year? 0 0 0 0 0  Comment - - - - -  Number falls in past yr: 0 0 0 0 0  Injury with Fall? 0 0 0 0 0  Comment - - - - -  Follow up Falls evaluation completed Falls evaluation completed Falls evaluation completed - -     No Known Allergies  Prior to Admission medications   Medication Sig Start Date End Date Taking? Authorizing Provider  atorvastatin (LIPITOR) 20 MG tablet TAKE 1 TABLET BY MOUTH ONCE DAILY AT 6PM 08/22/20   Darria Corvera, Laurita Quint, FNP  cetirizine (ZYRTEC) 10 MG chewable tablet Chew 10 mg by mouth daily.    [provider]  diphenhydrAMINE (BENADRYL) 25 mg capsule Take 25 mg by mouth every 6 (six) hours as needed.    [provider]  fluticasone (FLONASE) 50 MCG/ACT nasal spray Place 1 spray into both nostrils 2 (two) times daily. 02/09/19   Forrest Moron, MD  ibandronate (BONIVA) 150 MG tablet Take 150 mg by mouth every 30 (thirty) days. Take in the morning with a full glass of water, on an empty stomach, and do not take anything else by mouth or lie down for the next 30 min.     [provider]  montelukast (SINGULAIR) 10 MG tablet Take 1 tablet (10 mg total) by mouth at bedtime. 08/03/20   Adithya Difrancesco, Laurita Quint, FNP  Multiple Vitamin (MULTIVITAMIN) tablet Take 1 tablet by mouth daily.    [provider]  OVER THE COUNTER MEDICATION Vitamin D 3 400 units, one capsule daily.    [provider]  OVER THE COUNTER MEDICATION Calcium 600 mg one capsule daily.    [provider]  rizatriptan (MAXALT) 10 MG tablet TAKE 1 TABLET BY MOUTH AS NEEDED FOR MIGRAINE HEADACHE. MAY REPEAT IN 2 HOURS IF NEEDED 10/07/20   Wendie Agreste, MD    Past Medical History:  Diagnosis Date  . Allergy   . Hyperlipidemia   . Osteopenia     Past Surgical History:  Procedure Laterality Date  . CESAREAN SECTION    . CESAREAN SECTION N/A    Phreesia 07/31/2020  . DILATION AND CURETTAGE OF UTERUS    . MOUTH SURGERY      Social History   Tobacco Use  . Smoking status: Never Smoker  . Smokeless tobacco: Never Used  Substance Use Topics  . Alcohol use: Yes    Comment: occasionally    Family History  Problem Relation Age of Onset  .  Multiple sclerosis Mother   . Uterine cancer Mother   . Alzheimer's disease Father   . Breast cancer Sister 24  . Diabetes Neg Hx   . Heart disease Neg Hx   . Hypertension Neg Hx   . Colon cancer Neg Hx   . Esophageal cancer Neg Hx   . Rectal cancer Neg Hx   . Stomach cancer Neg Hx     Review of Systems  Constitutional: Negative for chills, fever and malaise/fatigue.  Eyes: Negative for blurred vision and double vision.  Respiratory: Negative for cough, shortness of breath and wheezing.   Cardiovascular: Negative for chest pain, palpitations and leg swelling.  Gastrointestinal: Negative for abdominal pain, blood in stool, constipation, diarrhea, heartburn, nausea and vomiting.  Genitourinary: Negative for dysuria, flank pain, frequency, hematuria and urgency.  Musculoskeletal: Negative for back pain and joint  pain.  Skin: Negative for rash.  Neurological: Negative for dizziness, weakness and headaches.     OBJECTIVE:  Today's Vitals   10/13/20 1339  BP: 137/84  Pulse: 75  Temp: 98.3 F (36.8 C)  SpO2: 96%  Weight: 136 lb (61.7 kg)  Height: 5' 3"  (1.6 m)   Body mass index is 24.09 kg/m.   Physical Exam Constitutional:      General: She is not in acute distress.    Appearance: Normal appearance. She is not ill-appearing.  HENT:     Head: Normocephalic.  Cardiovascular:     Rate and Rhythm: Normal rate and regular rhythm.     Pulses: Normal pulses.     Heart sounds: Normal heart sounds. No murmur heard. No friction rub. No gallop.   Pulmonary:     Effort: Pulmonary effort is normal. No respiratory distress.     Breath sounds: Normal breath sounds. No stridor. No wheezing, rhonchi or rales.  Abdominal:     General: Bowel sounds are normal.     Palpations: Abdomen is soft.     Tenderness: There is no abdominal tenderness.  Musculoskeletal:     Right lower leg: No edema.     Left lower leg: No edema.  Skin:    General: Skin is warm and dry.  Neurological:     Mental Status: She is alert and oriented to person, place, and time.  Psychiatric:        Mood and Affect: Mood normal.        Behavior: Behavior normal.     Results for orders placed or performed in visit on 10/13/20 (from the past 24 hour(s))  POCT urinalysis dipstick     Status: Abnormal   Collection Time: 10/13/20  1:43 PM  Result Value Ref Range   Color, UA yellow yellow   Clarity, UA clear clear   Glucose, UA negative negative mg/dL   Bilirubin, UA negative negative   Ketones, POC UA negative negative mg/dL   Spec Grav, UA <=1.005 (A) 1.010 - 1.025   Blood, UA negative negative   pH, UA 6.0 5.0 - 8.0   Protein Ur, POC negative negative mg/dL   Urobilinogen, UA 0.2 0.2 or 1.0 E.U./dL   Nitrite, UA Negative Negative   Leukocytes, UA Negative Negative  POCT Microscopic Urinalysis ([facility])     Status:  None   Collection Time: 10/13/20  1:50 PM  Result Value Ref Range   WBC,UR,HPF,POC None None WBC/hpf   RBC,UR,HPF,POC None None RBC/hpf   Bacteria None None, Too numerous to count   Mucus Absent Absent   Epithelial Cells, UR Per  Microscopy None None, Too numerous to count cells/hpf    No results found.   ASSESSMENT and PLAN  Problem List Items Addressed This Visit   None   Visit Diagnoses    Hematuria, unspecified type    -  Primary   Relevant Orders   POCT urinalysis dipstick (Completed)   POCT Microscopic Urinalysis ([facility]) (Completed)   Hypercalcemia       Relevant Orders   CMP14+EGFR   PTH, Intact and Calcium    Hematuria resolved.   Return if symptoms worsen or fail to improve, for Next scheduled visit.    Huston Foley Yoneko Talerico, FNP-BC Primary Care at [place], [place] [address] Ph.  [phone] Fax [phone]

## 2020-10-14 LAB — CMP14+EGFR
ALT: 42 IU/L — ABNORMAL HIGH (ref 0–32)
AST: 33 IU/L (ref 0–40)
Albumin/Globulin Ratio: 1.8 (ref 1.2–2.2)
Albumin: 4.6 g/dL (ref 3.8–4.9)
Alkaline Phosphatase: 72 IU/L (ref 44–121)
BUN/Creatinine Ratio: 24 — ABNORMAL HIGH (ref 9–23)
BUN: 22 mg/dL (ref 6–24)
Bilirubin Total: 0.3 mg/dL (ref 0.0–1.2)
CO2: 26 mmol/L (ref 20–29)
Calcium: 9.8 mg/dL (ref 8.7–10.2)
Chloride: 103 mmol/L (ref 96–106)
Creatinine, Ser: 0.9 mg/dL (ref 0.57–1.00)
GFR calc Af Amer: 82 mL/min/{1.73_m2} (ref >59)
GFR calc non Af Amer: 71 mL/min/{1.73_m2} (ref >59)
Globulin, Total: 2.6 g/dL (ref 1.5–4.5)
Glucose: 76 mg/dL (ref 65–99)
Potassium: 4.2 mmol/L (ref 3.5–5.2)
Sodium: 141 mmol/L (ref 134–144)
Total Protein: 7.2 g/dL (ref 6.0–8.5)

## 2020-10-14 LAB — PTH, INTACT AND CALCIUM: PTH: 40 pg/mL (ref 15–65)

## 2020-12-12 ENCOUNTER — Other Ambulatory Visit: Payer: Self-pay | Admitting: Family Medicine

## 2020-12-15 ENCOUNTER — Other Ambulatory Visit: Payer: Self-pay | Admitting: Family Medicine

## 2020-12-15 MED ORDER — RIZATRIPTAN BENZOATE 10 MG PO TABS: 10.0000 mg | ORAL_TABLET | ORAL | 2 refills | Status: DC | PRN

## 2020-12-15 NOTE — Telephone Encounter (Signed)
This medication was just filled 3 days ago is it ok to put refill on this med

## 2021-01-26 ENCOUNTER — Ambulatory Visit: Payer: Managed Care, Other (non HMO) | Admitting: Internal Medicine

## 2021-02-20 ENCOUNTER — Ambulatory Visit: Payer: Managed Care, Other (non HMO) | Admitting: Family Medicine

## 2021-03-14 NOTE — Progress Notes (Signed)
Virtual Visit via Telephone Note  I connected with Tamara Lutz, on 03/15/2021 at 11:04 AM by telephone due to the COVID-19 pandemic and verified that I am speaking with the correct person using two identifiers.  Due to current restrictions/limitations of in-office visits due to the COVID-19 pandemic, this scheduled clinical appointment was converted to a telehealth visit.   Consent: I discussed the limitations, risks, security and privacy concerns of performing an evaluation and management service by telephone and the availability of in person appointments. I also discussed with the patient that there may be a patient responsible charge related to this service. The patient expressed understanding and agreed to proceed.   Location of Patient: Home  Location of Provider: [facility] [place] participating in Telemedicine visit: Giovanni Bath Artemio Aly, NP Elmon Else, CMA   History of Present Illness: Tamara Lutz is a 60 year-old female who presents to establish care. PMH significant for migraine, osteoporosis, and hyperlipidemia.   Current issues and/or concerns: None.   Past Medical History:  Diagnosis Date  . Allergy   . Hyperlipidemia   . Osteopenia    No Known Allergies  Current Outpatient Medications on File Prior to Visit  Medication Sig Dispense Refill  . atorvastatin (LIPITOR) 20 MG tablet TAKE 1 TABLET BY MOUTH ONCE DAILY AT 6PM 90 tablet 3  . cetirizine (ZYRTEC) 10 MG chewable tablet Chew 10 mg by mouth daily.    . diphenhydrAMINE (BENADRYL) 25 mg capsule Take 25 mg by mouth every 6 (six) hours as needed.    . fluticasone (FLONASE) 50 MCG/ACT nasal spray Place 1 spray into both nostrils 2 (two) times daily. 48 g 3  . ibandronate (BONIVA) 150 MG tablet Take 150 mg by mouth every 30 (thirty) days. Take in the morning with a full glass of water, on an empty stomach, and do not take anything else by mouth or lie  down for the next 30 min.    . montelukast (SINGULAIR) 10 MG tablet Take 1 tablet (10 mg total) by mouth at bedtime. 90 tablet 3  . Multiple Vitamin (MULTIVITAMIN) tablet Take 1 tablet by mouth daily.    Marland Kitchen OVER THE COUNTER MEDICATION Vitamin D 3 400 units, one capsule daily.    Marland Kitchen OVER THE COUNTER MEDICATION Calcium 600 mg one capsule daily.    . rizatriptan (MAXALT) 10 MG tablet Take 1 tablet (10 mg total) by mouth as needed for migraine. May repeat in 2 hours if needed 10 tablet 2   No current facility-administered medications on file prior to visit.    Observations/Objective: Alert and oriented x 3. Not in acute distress. Physical examination not completed as this is a telemedicine visit.  Assessment and Plan: 1. Encounter to establish care: - Patient presents today to establish care.  - Return for annual physical examination, labs, and health maintenance. Arrive fasting meaning having no food for at least 8 hours prior to appointment. You may have only water or black coffee. Please take scheduled medications as normal.   Follow Up Instructions: Return for annual physical exam.   Patient was given clear instructions to go to Emergency Department or return to medical center if symptoms don't improve, worsen, or new problems develop.The patient verbalized understanding.  I discussed the assessment and treatment plan with the patient. The patient was provided an opportunity to ask questions and all were answered. The patient agreed with the plan and demonstrated an understanding of the  instructions.   The patient was advised to call back or seek an in-person evaluation if the symptoms worsen or if the condition fails to improve as anticipated.    I provided 5 minutes total of non-face-to-face time during this encounter.   Camillia Herter, NP  [facility] at [place], [place] 03/15/2021, 11:04 AM

## 2021-03-15 ENCOUNTER — Other Ambulatory Visit: Payer: Self-pay

## 2021-03-15 ENCOUNTER — Encounter: Payer: Self-pay | Admitting: Family

## 2021-03-15 ENCOUNTER — Telehealth (INDEPENDENT_AMBULATORY_CARE_PROVIDER_SITE_OTHER): Payer: Managed Care, Other (non HMO) | Admitting: Family

## 2021-03-15 DIAGNOSIS — Z7689 Persons encountering health services in other specified circumstances: Secondary | ICD-10-CM | POA: Diagnosis not present

## 2021-03-15 NOTE — Telephone Encounter (Signed)
Hi Tamara Lutz,   I will be able to refer you back to Gastroenterology for an updated colonoscopy closer to the August due date.   We can discuss more details at your next appointment with me for your annual physical exam.    During the interim if you have any additional questions or concerns please don't hesitate to let me know.   Thank you!  Tamara Fruits, NP  Family Medicine [place] [facility] [phone]

## 2021-03-15 NOTE — Progress Notes (Signed)
Establish care

## 2021-04-03 ENCOUNTER — Telehealth: Payer: Self-pay | Admitting: Family

## 2021-04-03 ENCOUNTER — Encounter: Payer: Self-pay | Admitting: Family

## 2021-04-03 NOTE — Telephone Encounter (Signed)
Pt calls stating she was diagnosed w/ Covid on Saturday 04/01/21 and was told to follow up with PCP to request some treatment medication to take. Pt states she's had a fever,cough, sore throat and congestion. Please advise and thank you

## 2021-04-04 ENCOUNTER — Other Ambulatory Visit: Payer: Self-pay

## 2021-04-04 ENCOUNTER — Telehealth: Payer: Managed Care, Other (non HMO) | Admitting: Physician Assistant

## 2021-04-04 DIAGNOSIS — U071 COVID-19: Secondary | ICD-10-CM | POA: Diagnosis not present

## 2021-04-04 DIAGNOSIS — R059 Cough, unspecified: Secondary | ICD-10-CM

## 2021-04-04 MED ORDER — BENZONATATE 200 MG PO CAPS: 200.0000 mg | ORAL_CAPSULE | Freq: Two times a day (BID) | ORAL | 0 refills | Status: DC | PRN

## 2021-04-04 NOTE — Progress Notes (Signed)
Patient reports testing positive Saturday night with home test. Patient reports fever yesterday, denies N/V/Diarrhea. Patient reports morning HA, cough is dry, Patient has taken nyquill with relief for the cough Patient has used excedrin for HA. No other household members are positive. Taste is intermittent and smell is present.

## 2021-04-04 NOTE — Progress Notes (Signed)
Established Patient Office Visit  Subjective:  Patient ID: Tamara Lutz, female    DOB: 1961-01-30  Age: 60 y.o. MRN: [id number]   Virtual Visit via Telephone Note  I connected with Tamara Lutz on 04/04/21 at  4:40 PM EDT by telephone and verified that I am speaking with the correct person using two identifiers.  Location: Patient: Home Provider: [facility] Medicine Unit    I discussed the limitations, risks, security and privacy concerns of performing an evaluation and management service by telephone and the availability of in person appointments. I also discussed with the patient that there may be a patient responsible charge related to this service. The patient expressed understanding and agreed to proceed.  CC: Positive Covid test  History of Present Illness:  Tamara Lutz reports that she started having a headache, sore throat, nasal congestion, dry cough and fever along with mild body aches and fatigue on Saturday, April 01, 2021.  Reports that she took a home COVID test later that day which was positive.  States that she has not had a measurable fever in the last 2 days.  Reports that she is staying hydrated, and is able to eat.  States that she has been using NyQuil, nasal spray, Tylenol as well as her migraine medication with a small amount of relief.  Does endorse that she has had [place] vaccines and did have a Pfizer booster shot right before Thanksgiving of 2021.    Observations/Objective: Medical history and current medications reviewed, no physical exam completed    Past Medical History:  Diagnosis Date   Allergy    Hyperlipidemia    Osteopenia     Past Surgical History:  Procedure Laterality Date   CESAREAN SECTION     CESAREAN SECTION N/A    Phreesia 07/31/2020   DILATION AND CURETTAGE OF UTERUS     MOUTH SURGERY      Family History  Problem Relation Age of Onset   Multiple sclerosis Mother    Uterine  cancer Mother    Alzheimer's disease Father    Breast cancer Sister 39   Diabetes Neg Hx    Heart disease Neg Hx    Hypertension Neg Hx    Colon cancer Neg Hx    Esophageal cancer Neg Hx    Rectal cancer Neg Hx    Stomach cancer Neg Hx     Social History   Socioeconomic History   Marital status: Married    Spouse name: Not on file   Number of children: 5   Years of education: 16   Highest education level: Not on file  Occupational History   Not on file  Tobacco Use   Smoking status: Never   Smokeless tobacco: Never  Vaping Use   Vaping Use: Never used  Substance and Sexual Activity   Alcohol use: Yes    Comment: occasionally   Drug use: No   Sexual activity: Yes  Other Topics Concern   Not on file  Social History Narrative   Right handed   One story home   Drinks caffeine    Social Determinants of Health   Financial Resource Strain: Not on file  Food Insecurity: Not on file  Transportation Needs: Not on file  Physical Activity: Not on file  Stress: Not on file  Social Connections: Not on file  Intimate Partner Violence: Not on file    Outpatient Medications Prior to Visit  Medication Sig Dispense Refill  atorvastatin (LIPITOR) 20 MG tablet TAKE 1 TABLET BY MOUTH ONCE DAILY AT 6PM 90 tablet 3   cetirizine (ZYRTEC) 10 MG chewable tablet Chew 10 mg by mouth daily.     diphenhydrAMINE (BENADRYL) 25 mg capsule Take 25 mg by mouth every 6 (six) hours as needed.     fluticasone (FLONASE) 50 MCG/ACT nasal spray Place 1 spray into both nostrils 2 (two) times daily. 48 g 3   ibandronate (BONIVA) 150 MG tablet Take 150 mg by mouth every 30 (thirty) days. Take in the morning with a full glass of water, on an empty stomach, and do not take anything else by mouth or lie down for the next 30 min.     montelukast (SINGULAIR) 10 MG tablet Take 1 tablet (10 mg total) by mouth at bedtime. 90 tablet 3   Multiple Vitamin (MULTIVITAMIN) tablet Take 1 tablet by mouth daily.      OVER THE COUNTER MEDICATION Vitamin D 3 400 units, one capsule daily.     OVER THE COUNTER MEDICATION Calcium 600 mg one capsule daily.     rizatriptan (MAXALT) 10 MG tablet Take 1 tablet (10 mg total) by mouth as needed for migraine. May repeat in 2 hours if needed 10 tablet 2   No facility-administered medications prior to visit.    No Known Allergies  ROS Review of Systems  Constitutional:  Negative for chills and fever.  HENT:  Positive for congestion and sore throat. Negative for ear pain, postnasal drip, sinus pressure and trouble swallowing.   Eyes: Negative.   Respiratory:  Positive for cough. Negative for shortness of breath and wheezing.   Cardiovascular:  Negative for chest pain.  Gastrointestinal:  Negative for abdominal pain and vomiting.  Endocrine: Negative.   Genitourinary: Negative.   Musculoskeletal:  Positive for myalgias.  Skin: Negative.   Allergic/Immunologic: Negative.   Neurological:  Positive for headaches.  Hematological: Negative.   Psychiatric/Behavioral: Negative.       Objective:      LMP 04/09/2012  Wt Readings from Last 3 Encounters:  10/13/20 136 lb (61.7 kg)  08/22/20 135 lb (61.2 kg)  08/03/20 134 lb (60.8 kg)     Health Maintenance Due  Topic Date Due   HIV Screening  Never done   PAP SMEAR-Modifier  Never done   COVID-19 Vaccine (3 - Booster for Pfizer series) 05/27/2020    There are no preventive care reminders to display for this patient.  Lab Results  Component Value Date   TSH 2.180 08/03/2020   Lab Results  Component Value Date   WBC 5.7 08/03/2020   HGB 13.7 08/03/2020   HCT 40.9 08/03/2020   MCV 89 08/03/2020   PLT 251 08/03/2020   Lab Results  Component Value Date   NA 141 10/13/2020   K 4.2 10/13/2020   CO2 26 10/13/2020   GLUCOSE 76 10/13/2020   BUN 22 10/13/2020   CREATININE 0.90 10/13/2020   BILITOT 0.3 10/13/2020   ALKPHOS 72 10/13/2020   AST 33 10/13/2020   ALT 42 (H) 10/13/2020   PROT 7.2  10/13/2020   ALBUMIN 4.6 10/13/2020   CALCIUM 9.8 10/13/2020   Lab Results  Component Value Date   CHOL 201 (H) 08/03/2020   Lab Results  Component Value Date   HDL 59 08/03/2020   Lab Results  Component Value Date   LDLCALC 131 (H) 08/03/2020   Lab Results  Component Value Date   TRIG 62 08/03/2020   Lab Results  Component Value Date   CHOLHDL 3.4 08/03/2020   Lab Results  Component Value Date   HGBA1C 5.3 08/03/2020      Assessment & Plan:   Problem List Items Addressed This Visit       Other   COVID-19 - Primary   Cough   Relevant Medications   benzonatate (TESSALON) 200 MG capsule    Meds ordered this encounter  Medications   benzonatate (TESSALON) 200 MG capsule    Sig: Take 1 capsule (200 mg total) by mouth 2 (two) times daily as needed for cough.    Dispense:  20 capsule    Refill:  0    Order Specific Question:   Supervising Provider    Answer:   Elsie Stain [[id number]]   Assessment and Plan:  1. COVID-19 Patient education given on paxlovid treatment, patient declines treatment at this time.  Continue supportive care, patient encouraged to follow-up on Thursday June 31st if no improvement in symptoms.  Red flags given for prompt reevaluation.  2. Cough Trial Tessalon Perles - benzonatate (TESSALON) 200 MG capsule; Take 1 capsule (200 mg total) by mouth 2 (two) times daily as needed for cough.  Dispense: 20 capsule; Refill: 0  Follow Up Instructions:    I discussed the assessment and treatment plan with the patient. The patient was provided an opportunity to ask questions and all were answered. The patient agreed with the plan and demonstrated an understanding of the instructions.   The patient was advised to call back or seek an in-person evaluation if the symptoms worsen or if the condition fails to improve as anticipated.  I provided 21 minutes of non-face-to-face time during this encounter.    Loraine Grip Mayers, PA-C

## 2021-04-05 DIAGNOSIS — U071 COVID-19: Secondary | ICD-10-CM | POA: Insufficient documentation

## 2021-04-05 DIAGNOSIS — R059 Cough, unspecified: Secondary | ICD-10-CM | POA: Insufficient documentation

## 2021-04-05 NOTE — Telephone Encounter (Signed)
Pt had appointment 04/04/2021

## 2021-04-21 ENCOUNTER — Telehealth: Payer: Managed Care, Other (non HMO) | Admitting: Family

## 2021-05-10 ENCOUNTER — Telehealth: Payer: Self-pay | Admitting: Family

## 2021-05-10 NOTE — Telephone Encounter (Signed)
Patient called in stating she was told by her pharmacy to call the office. Patient is needing a refill  rizatriptan (MAXALT) 10 MG tablet   [facility] ([address]), [place]  [id number] ([place]) [place] [address]  Phone:  [phone]  Fax:  [phone]

## 2021-05-11 ENCOUNTER — Other Ambulatory Visit: Payer: Self-pay

## 2021-05-11 DIAGNOSIS — G43009 Migraine without aura, not intractable, without status migrainosus: Secondary | ICD-10-CM

## 2021-05-11 MED ORDER — RIZATRIPTAN BENZOATE 10 MG PO TABS: 10.0000 mg | ORAL_TABLET | ORAL | 2 refills | Status: DC | PRN

## 2021-05-22 NOTE — Progress Notes (Signed)
Patient ID: Tamara Lutz, female    DOB: Feb 05, 1961  MRN: [id number]  CC: Annual Physical Exam   Subjective: Tamara Lutz is a 60 y.o. female who presents for annual physical exam.   Her concerns today include:  INSOMNIA: Insomnia ongoing for several years since beginning menopause. Has tried over-the-counter Melatonin and Unisom without much relief. She is sensitive to caffeine, taking Excedrin as needed for migraines. Not ready to begin taking a new medication just yet. Plans to give it some thought and notify provider of decision.   2. SKIN CONCERN: Has new onset spot on skin, left hand. Has seen Dermatology in the past for other concerns. Had a mole removed. General checkup at that time was normal. Would like to return for follow-up at [facility].  3. VAGINAL ITCHING: Vaginal itching especially noticeable after swimming. Mild odor sometimes. Denies vaginal discharge. Using Vagisil cream helps. Endorses urinary frequency.   Patient Active Problem List   Diagnosis Date Noted   Insomnia 05/24/2021   Osteoporosis 06/02/2019   Migraine 06/02/2019   Hyperlipidemia 06/02/2019     Current Outpatient Medications on File Prior to Visit  Medication Sig Dispense Refill   Calcium Carb-Cholecalciferol 600-400 MG-UNIT CAPS Take 1 capsule by mouth.     atorvastatin (LIPITOR) 20 MG tablet TAKE 1 TABLET BY MOUTH ONCE DAILY AT 6PM 90 tablet 3   cetirizine (ZYRTEC) 10 MG chewable tablet Chew 10 mg by mouth daily.     diphenhydrAMINE (BENADRYL) 25 mg capsule Take 25 mg by mouth every 6 (six) hours as needed.     fluticasone (FLONASE) 50 MCG/ACT nasal spray Place 1 spray into both nostrils 2 (two) times daily. 48 g 3   ibandronate (BONIVA) 150 MG tablet Take 150 mg by mouth every 30 (thirty) days. Take in the morning with a full glass of water, on an empty stomach, and do not take anything else by mouth or lie down for the next 30 min.     montelukast (SINGULAIR) 10 MG tablet  Take 1 tablet (10 mg total) by mouth at bedtime. 90 tablet 3   Multiple Vitamin (MULTIVITAMIN) tablet Take 1 tablet by mouth daily.     rizatriptan (MAXALT) 10 MG tablet Take 1 tablet (10 mg total) by mouth as needed for migraine. May repeat in 2 hours if needed 10 tablet 2   No current facility-administered medications on file prior to visit.    No Known Allergies  Social History   Socioeconomic History   Marital status: Married    Spouse name: Not on file   Number of children: 5   Years of education: 16   Highest education level: Not on file  Occupational History   Not on file  Tobacco Use   Smoking status: Never   Smokeless tobacco: Never  Vaping Use   Vaping Use: Never used  Substance and Sexual Activity   Alcohol use: Yes    Comment: occasionally   Drug use: No   Sexual activity: Yes  Other Topics Concern   Not on file  Social History Narrative   Right handed   One story home   Drinks caffeine    Social Determinants of Health   Financial Resource Strain: Not on file  Food Insecurity: Not on file  Transportation Needs: Not on file  Physical Activity: Not on file  Stress: Not on file  Social Connections: Not on file  Intimate Partner Violence: Not on file    Family History  Problem Relation Age of Onset   Multiple sclerosis Mother    Uterine cancer Mother    Alzheimer's disease Father    Breast cancer Sister 43   Diabetes Neg Hx    Heart disease Neg Hx    Hypertension Neg Hx    Colon cancer Neg Hx    Esophageal cancer Neg Hx    Rectal cancer Neg Hx    Stomach cancer Neg Hx     Past Surgical History:  Procedure Laterality Date   CESAREAN SECTION     CESAREAN SECTION N/A    Phreesia 07/31/2020   DILATION AND CURETTAGE OF UTERUS     MOUTH SURGERY      ROS: Review of Systems Negative except as stated above  PHYSICAL EXAM: BP 120/86 (BP Location: Left Arm, Patient Position: Sitting, Cuff Size: Normal)   Pulse 75   Temp 98.3 F (36.8 C)    Resp 16   Ht 5' 2.99" (1.6 m)   Wt 132 lb 3.2 oz (60 kg)   LMP 04/09/2012   SpO2 96%   BMI 23.42 kg/m   Physical Exam HENT:     Head: Normocephalic and atraumatic.     Right Ear: Tympanic membrane, ear canal and external ear normal.     Left Ear: Tympanic membrane, ear canal and external ear normal.  Eyes:     Extraocular Movements: Extraocular movements intact.     Conjunctiva/sclera: Conjunctivae normal.     Pupils: Pupils are equal, round, and reactive to light.  Cardiovascular:     Rate and Rhythm: Normal rate and regular rhythm.     Pulses: Normal pulses.     Heart sounds: Normal heart sounds.  Pulmonary:     Effort: Pulmonary effort is normal.     Breath sounds: Normal breath sounds.  Chest:     Comments: Patient declined exam.  Abdominal:     General: Abdomen is flat. Bowel sounds are normal.     Palpations: Abdomen is soft.  Genitourinary:    Comments: Patient declined exam.  Musculoskeletal:        General: Normal range of motion.     Cervical back: Normal range of motion and neck supple.  Skin:    General: Skin is warm and dry.     Capillary Refill: Capillary refill takes less than 2 seconds.     Comments: Macular erythematous non-painful spot on left hand, no evidence of drainage or edema.   Neurological:     General: No focal deficit present.     Mental Status: She is alert and oriented to person, place, and time.  Psychiatric:        Mood and Affect: Mood normal.        Behavior: Behavior normal.    ASSESSMENT AND PLAN: 1. Annual physical exam: - Counseled on 150 minutes of exercise per week as tolerated, healthy eating (including decreased daily intake of saturated fats, cholesterol, added sugars, sodium), STI prevention, and routine healthcare maintenance.  2. Screening for metabolic disorder: - UXL24+MWNU to check kidney function, liver function, and electrolyte balance.  - CMP14+EGFR  3. Screening, iron deficiency anemia: - CBC to screen for  anemia. - CBC  4. Diabetes mellitus screening: - Hemoglobin A1c to screen for pre-diabetes/diabetes. - Hemoglobin A1c  5. Screening cholesterol level: - Lipid panel to screen for high cholesterol.  - Lipid panel  6. Thyroid disorder screen: - TSH to check thyroid function.  - TSH  7. Colon cancer screening: -  Referral to Gastroenterology for colon cancer screening by colonoscopy. - Ambulatory referral to Gastroenterology  8. Screening for HIV (human immunodeficiency virus): - HIV antibody to screen for human immunodeficiency virus.  - HIV Antibody (routine testing w rflx)  9. Insomnia, unspecified type: - Patient reports ongoing since menopause. Has tried over-the-counter medications without relief. Not ready to begin new medication as of present. Would like to think on this before beginning medication. - Counseled patient to follow-up within 2 weeks or less if she would like to begin Trazodone for insomnia. Patient agreeable.   10. Skin abnormality: - New onset spot on left hand, no evidence of pain or drainage. Patient followed by Dermatology in the past for other concerns which were normal at that time. Per patient request referral to Nicholas Lose, PA at [place] with primary provider as scheduled.  - Ambulatory referral to Dermatology  11. Vaginal itching: - Cervicovaginal self-swab to screen for chlamydia, gonorrhea, trichomonas, bacterial vaginitis, and candida vaginitis. - Urinalysis today negative for nitrites and leukocytes which suggests no urinary tract infection. There is minimal trace of blood present on urinalysis. Will await for cervicovaginal swab results for further evaluation.  - Follow-up with primary provider as scheduled.  - POCT URINALYSIS DIP (CLINITEK) - Cervicovaginal ancillary only   Patient was given the opportunity to ask questions.  Patient verbalized understanding of the plan and was able to repeat key  elements of the plan. Patient was given clear instructions to go to Emergency Department or return to medical center if symptoms don't improve, worsen, or new problems develop.The patient verbalized understanding.   Orders Placed This Encounter  Procedures   Lipid panel   TSH   CBC   CMP14+EGFR   Hemoglobin A1c   HIV Antibody (routine testing w rflx)   Ambulatory referral to Gastroenterology   Ambulatory referral to Dermatology   POCT URINALYSIS DIP (CLINITEK)    Follow-up with primary provider as scheduled.  Camillia Herter, NP

## 2021-05-24 ENCOUNTER — Other Ambulatory Visit: Payer: Self-pay

## 2021-05-24 ENCOUNTER — Ambulatory Visit (INDEPENDENT_AMBULATORY_CARE_PROVIDER_SITE_OTHER): Payer: Managed Care, Other (non HMO) | Admitting: Family

## 2021-05-24 ENCOUNTER — Other Ambulatory Visit (HOSPITAL_COMMUNITY)
Admission: RE | Admit: 2021-05-24 | Discharge: 2021-05-24 | Disposition: A | Discharge status: Home / Self Care | Payer: Managed Care, Other (non HMO) | Source: Ambulatory Visit | Attending: Family | Admitting: Family

## 2021-05-24 ENCOUNTER — Encounter: Payer: Self-pay | Admitting: Family

## 2021-05-24 VITALS — BP 120/86 | HR 75 | Temp 98.3°F | Resp 16 | Ht 62.99 in | Wt 132.2 lb

## 2021-05-24 DIAGNOSIS — Z13220 Encounter for screening for lipoid disorders: Secondary | ICD-10-CM

## 2021-05-24 DIAGNOSIS — E785 Hyperlipidemia, unspecified: Secondary | ICD-10-CM

## 2021-05-24 DIAGNOSIS — G4700 Insomnia, unspecified: Secondary | ICD-10-CM

## 2021-05-24 DIAGNOSIS — Z131 Encounter for screening for diabetes mellitus: Secondary | ICD-10-CM

## 2021-05-24 DIAGNOSIS — Z13228 Encounter for screening for other metabolic disorders: Secondary | ICD-10-CM | POA: Diagnosis not present

## 2021-05-24 DIAGNOSIS — N898 Other specified noninflammatory disorders of vagina: Secondary | ICD-10-CM

## 2021-05-24 DIAGNOSIS — Z0000 Encounter for general adult medical examination without abnormal findings: Secondary | ICD-10-CM

## 2021-05-24 DIAGNOSIS — Z114 Encounter for screening for human immunodeficiency virus [HIV]: Secondary | ICD-10-CM

## 2021-05-24 DIAGNOSIS — Z1329 Encounter for screening for other suspected endocrine disorder: Secondary | ICD-10-CM

## 2021-05-24 DIAGNOSIS — Z1211 Encounter for screening for malignant neoplasm of colon: Secondary | ICD-10-CM

## 2021-05-24 DIAGNOSIS — Z130 Encounter for screening for diseases of the blood and blood-forming organs and certain disorders involving the immune mechanism: Secondary | ICD-10-CM

## 2021-05-24 DIAGNOSIS — L989 Disorder of the skin and subcutaneous tissue, unspecified: Secondary | ICD-10-CM

## 2021-05-24 LAB — POCT URINALYSIS DIP (CLINITEK)
Bilirubin, UA: NEGATIVE
Glucose, UA: NEGATIVE mg/dL
Ketones, POC UA: NEGATIVE mg/dL
Leukocytes, UA: NEGATIVE
Nitrite, UA: NEGATIVE
POC PROTEIN,UA: NEGATIVE
Spec Grav, UA: 1.02 (ref 1.010–1.025)
Urobilinogen, UA: 0.2 E.U./dL
pH, UA: 7 (ref 5.0–8.0)

## 2021-05-24 MED ORDER — ATORVASTATIN CALCIUM 20 MG PO TABS: ORAL_TABLET | ORAL | 3 refills | Status: DC

## 2021-05-24 NOTE — Progress Notes (Signed)
Pt presents for annual physical exam pt reports mild vaginal itching w/urinary frequency

## 2021-05-24 NOTE — Patient Instructions (Addendum)
Please let provider know within 2 weeks or less if you would like to begin Trazodone for insomnia. Thank you for choosing Primary Care at [facility] for your medical home!    Tamara Lutz was seen by Camillia Herter, NP today.   Eben Burow Brinlee's primary care provider is Camillia Herter, NP.   For the best care possible,  you should try to see Durene Fruits, NP whenever you come to clinic.   We look forward to seeing you again soon!  If you have any questions about your visit today,  please call us at [phone]  Or feel free to reach your provider via [place].    Preventive Care 59-22 Years Old, Female Preventive care refers to lifestyle choices and visits with your health care provider that can promote health and wellness. This includes: A yearly physical exam. This is also called an annual wellness visit. Regular dental and eye exams. Immunizations. Screening for certain conditions. Healthy lifestyle choices, such as: Eating a healthy diet. Getting regular exercise. Not using drugs or products that contain nicotine and tobacco. Limiting alcohol use. What can I expect for my preventive care visit? Physical exam Your health care provider will check your: Height and weight. These may be used to calculate your BMI (body mass index). BMI is a measurement that tells if you are at a healthy weight. Heart rate and blood pressure. Body temperature. Skin for abnormal spots. Counseling Your health care provider may ask you questions about your: Past medical problems. Family's medical history. Alcohol, tobacco, and drug use. Emotional well-being. Home life and relationship well-being. Sexual activity. Diet, exercise, and sleep habits. Work and work Statistician. Access to firearms. Method of birth control. Menstrual cycle. Pregnancy history. What immunizations do I need?  Vaccines are usually given at various ages, according to a schedule. Your health care  provider will recommend vaccines for you based on your age, medicalhistory, and lifestyle or other factors, such as travel or where you work. What tests do I need? Blood tests Lipid and cholesterol levels. These may be checked every 5 years, or more often if you are over 40 years old. Hepatitis C test. Hepatitis B test. Screening Lung cancer screening. You may have this screening every year starting at age 75 if you have a 30-pack-year history of smoking and currently smoke or have quit within the past 15 years. Colorectal cancer screening. All adults should have this screening starting at age 54 and continuing until age 38. Your health care provider may recommend screening at age 52 if you are at increased risk. You will have tests every 1-10 years, depending on your results and the type of screening test. Diabetes screening. This is done by checking your blood sugar (glucose) after you have not eaten for a while (fasting). You may have this done every 1-3 years. Mammogram. This may be done every 1-2 years. Talk with your health care provider about when you should start having regular mammograms. This may depend on whether you have a family history of breast cancer. BRCA-related cancer screening. This may be done if you have a family history of breast, ovarian, tubal, or peritoneal cancers. Pelvic exam and Pap test. This may be done every 3 years starting at age 45. Starting at age 30, this may be done every 5 years if you have a Pap test in combination with an HPV test. Other tests STD (sexually transmitted disease) testing, if you are at risk. Bone density scan. This  is done to screen for osteoporosis. You may have this scan if you are at high risk for osteoporosis. Talk with your health care provider about your test results, treatment options,and if necessary, the need for more tests. Follow these instructions at home: Eating and drinking  Eat a diet that includes fresh fruits and  vegetables, whole grains, lean protein, and low-fat dairy products. Take vitamin and mineral supplements as recommended by your health care provider. Do not drink alcohol if: Your health care provider tells you not to drink. You are pregnant, may be pregnant, or are planning to become pregnant. If you drink alcohol: Limit how much you have to 0-1 drink a day. Be aware of how much alcohol is in your drink. In the [place] one drink equals one 12 oz bottle of beer (355 mL), one 5 oz glass of wine (148 mL), or one 1 oz glass of hard liquor (44 mL).  Lifestyle Take daily care of your teeth and gums. Brush your teeth every morning and night with fluoride toothpaste. Floss one time each day. Stay active. Exercise for at least 30 minutes 5 or more days each week. Do not use any products that contain nicotine or tobacco, such as cigarettes, e-cigarettes, and chewing tobacco. If you need help quitting, ask your health care provider. Do not use drugs. If you are sexually active, practice safe sex. Use a condom or other form of protection to prevent STIs (sexually transmitted infections). If you do not wish to become pregnant, use a form of birth control. If you plan to become pregnant, see your health care provider for a prepregnancy visit. If told by your health care provider, take low-dose aspirin daily starting at age 33. Find healthy ways to cope with stress, such as: Meditation, yoga, or listening to music. Journaling. Talking to a trusted person. Spending time with friends and family. Safety Always wear your seat belt while driving or riding in a vehicle. Do not drive: If you have been drinking alcohol. Do not ride with someone who has been drinking. When you are tired or distracted. While texting. Wear a helmet and other protective equipment during sports activities. If you have firearms in your house, make sure you follow all gun safety procedures. What's next? Visit your health care  provider once a year for an annual wellness visit. Ask your health care provider how often you should have your eyes and teeth checked. Stay up to date on all vaccines. This information is not intended to replace advice given to you by your health care provider. Make sure you discuss any questions you have with your healthcare provider. Document Revised: 06/28/2020 Document Reviewed: 06/05/2018 Elsevier Patient Education  2022 [employer].

## 2021-05-24 NOTE — Progress Notes (Signed)
Urinalysis discussed in office. Mild trace blood in urine. We will wait on vaginal self swab results for further evaluation.

## 2021-05-25 LAB — CMP14+EGFR
ALT: 21 IU/L (ref 0–32)
AST: 25 IU/L (ref 0–40)
Albumin/Globulin Ratio: 1.8 (ref 1.2–2.2)
Albumin: 4.6 g/dL (ref 3.8–4.9)
Alkaline Phosphatase: 74 IU/L (ref 44–121)
BUN/Creatinine Ratio: 20 (ref 9–23)
BUN: 17 mg/dL (ref 6–24)
Bilirubin Total: 0.4 mg/dL (ref 0.0–1.2)
CO2: 23 mmol/L (ref 20–29)
Calcium: 9.7 mg/dL (ref 8.7–10.2)
Chloride: 101 mmol/L (ref 96–106)
Creatinine, Ser: 0.85 mg/dL (ref 0.57–1.00)
Globulin, Total: 2.5 g/dL (ref 1.5–4.5)
Glucose: 71 mg/dL (ref 65–99)
Potassium: 4.4 mmol/L (ref 3.5–5.2)
Sodium: 139 mmol/L (ref 134–144)
Total Protein: 7.1 g/dL (ref 6.0–8.5)
eGFR: 79 mL/min/{1.73_m2} (ref >59)

## 2021-05-25 LAB — CERVICOVAGINAL ANCILLARY ONLY
Bacterial Vaginitis (gardnerella): NEGATIVE
Candida Glabrata: NEGATIVE
Candida Vaginitis: NEGATIVE
Chlamydia: NEGATIVE
Comment: NEGATIVE
Comment: NEGATIVE
Comment: NEGATIVE
Comment: NEGATIVE
Comment: NEGATIVE
Comment: NORMAL
Neisseria Gonorrhea: NEGATIVE
Trichomonas: NEGATIVE

## 2021-05-25 LAB — CBC
Hematocrit: 41.8 % (ref 34.0–46.6)
Hemoglobin: 13.7 g/dL (ref 11.1–15.9)
MCH: 29.4 pg (ref 26.6–33.0)
MCHC: 32.8 g/dL (ref 31.5–35.7)
MCV: 90 fL (ref 79–97)
Platelets: 249 10*3/uL (ref 150–450)
RBC: 4.66 x10E6/uL (ref 3.77–5.28)
RDW: 12.7 % (ref 11.7–15.4)
WBC: 5.9 10*3/uL (ref 3.4–10.8)

## 2021-05-25 LAB — LIPID PANEL
Chol/HDL Ratio: 3 ratio (ref 0.0–4.4)
Cholesterol, Total: 185 mg/dL (ref 100–199)
HDL: 61 mg/dL (ref >39)
LDL Chol Calc (NIH): 107 mg/dL — ABNORMAL HIGH (ref 0–99)
Triglycerides: 93 mg/dL (ref 0–149)
VLDL Cholesterol Cal: 17 mg/dL (ref 5–40)

## 2021-05-25 LAB — TSH: TSH: 2.13 u[IU]/mL (ref 0.450–4.500)

## 2021-05-25 LAB — HEMOGLOBIN A1C
Est. average glucose Bld gHb Est-mCnc: 105 mg/dL
Hgb A1c MFr Bld: 5.3 % (ref 4.8–5.6)

## 2021-05-25 LAB — HIV ANTIBODY (ROUTINE TESTING W REFLEX): HIV Screen 4th Generation wRfx: NONREACTIVE

## 2021-05-25 NOTE — Progress Notes (Signed)
Kidney function normal.   Liver function normal.   Thyroid function normal.   No diabetes.   No anemia.   HIV negative.   Cholesterol higher than expected. High cholesterol may increase risk of heart attack and/or stroke. Consider eating more fruits, vegetables, and lean baked meats such as chicken or fish. Moderate intensity exercise at least 150 minutes as tolerated per week may help as well. However, your risk of heart attack/stroke in ten years is less than average risk.  I still recommend to continue Atorvastatin as prescribed for high cholesterol. Encouraged to have cholesterol rechecked in 3 to 6 months.   The following is for provider reference only: The 10-year ASCVD risk score is: 2.3%   Values used to calculate the score:     Age: 60 years     Sex: Female     Is Non-Hispanic African American: No     Diabetic: No     Tobacco smoker: No     Systolic Blood Pressure: 123456 mmHg     Is BP treated: No     HDL Cholesterol: 61 mg/dL     Total Cholesterol: 185 mg/dL

## 2021-05-25 NOTE — Progress Notes (Signed)
Gonorrhea, Chlamydia, Trichomonas, Bacterial Vaginitis, and Candida Vaginitis (sometimes called yeast infection) negative.

## 2021-05-25 NOTE — Progress Notes (Signed)
  Labs results and result note from provider reviewed by patient through [place]

## 2021-07-27 ENCOUNTER — Ambulatory Visit (AMBULATORY_SURGERY_CENTER): Payer: Managed Care, Other (non HMO) | Admitting: *Deleted

## 2021-07-27 ENCOUNTER — Other Ambulatory Visit: Payer: Self-pay

## 2021-07-27 VITALS — Ht 63.0 in | Wt 135.0 lb

## 2021-07-27 DIAGNOSIS — Z86010 Personal history of colonic polyps: Secondary | ICD-10-CM

## 2021-07-27 MED ORDER — CLENPIQ 10-3.5-12 MG-GM -GM/160ML PO SOLN: 1.0000 | ORAL | 0 refills | Status: DC

## 2021-07-27 NOTE — Progress Notes (Signed)
Patient's pre-visit was done today over the phone with the patient due to COVID-19 pandemic. Name,DOB and address verified. Patient denies any allergies to Eggs and Soy. Patient denies any problems with anesthesia/sedation. Patient is not taking any diet pills or blood thinners. No home Oxygen. Packet of Prep instructions mailed to patient including a copy of a consent form & coupon-pt is aware. Patient understands to call us back with any questions or concerns. Patient is aware of our care-partner policy and KVTXL-21 safety protocol.   EMMI education assigned to the patient for the procedure, sent to [facility].   The patient is COVID-19 vaccinated.

## 2021-07-28 ENCOUNTER — Encounter: Payer: Self-pay | Admitting: Gastroenterology

## 2021-08-10 ENCOUNTER — Ambulatory Visit (AMBULATORY_SURGERY_CENTER): Payer: Managed Care, Other (non HMO) | Admitting: Gastroenterology

## 2021-08-10 ENCOUNTER — Other Ambulatory Visit: Payer: Self-pay

## 2021-08-10 ENCOUNTER — Encounter: Payer: Self-pay | Admitting: Gastroenterology

## 2021-08-10 VITALS — BP 111/69 | HR 58 | Temp 98.9°F | Resp 13 | Ht 62.0 in | Wt 135.0 lb

## 2021-08-10 DIAGNOSIS — Z86010 Personal history of colonic polyps: Secondary | ICD-10-CM | POA: Diagnosis not present

## 2021-08-10 DIAGNOSIS — D128 Benign neoplasm of rectum: Secondary | ICD-10-CM

## 2021-08-10 MED ORDER — CIPROFLOXACIN HCL 500 MG PO TABS: 500.0000 mg | ORAL_TABLET | Freq: Two times a day (BID) | ORAL | 0 refills | Status: DC

## 2021-08-10 MED ORDER — SODIUM CHLORIDE 0.9 % IV SOLN: 500.0000 mL | Freq: Once | INTRAVENOUS | Status: DC

## 2021-08-10 NOTE — Progress Notes (Signed)
Called to room to assist during endoscopic procedure.  Patient ID and intended procedure confirmed with present staff. Received instructions for my participation in the procedure from the performing physician.  

## 2021-08-10 NOTE — Progress Notes (Signed)
Pt's states no medical or surgical changes since previsit or office visit. VS assessed by D.T 

## 2021-08-10 NOTE — Progress Notes (Signed)
PT taken to PACU. Monitors in place. VSS. Report given to RN. 

## 2021-08-10 NOTE — Patient Instructions (Signed)
YOU HAD AN ENDOSCOPIC PROCEDURE TODAY AT [facility]:   Refer to the procedure report that was given to you for any specific questions about what was found during the examination.  If the procedure report does not answer your questions, please call your gastroenterologist to clarify.  If you requested that your care partner not be given the details of your procedure findings, then the procedure report has been included in a sealed envelope for you to review at your convenience later.  YOU SHOULD EXPECT: Some feelings of bloating in the abdomen. Passage of more gas than usual.  Walking can help get rid of the air that was put into your GI tract during the procedure and reduce the bloating. If you had a lower endoscopy (such as a colonoscopy or flexible sigmoidoscopy) you may notice spotting of blood in your stool or on the toilet paper. If you underwent a bowel prep for your procedure, you may not have a normal bowel movement for a few days.  Please Note:  You might notice some irritation and congestion in your nose or some drainage.  This is from the oxygen used during your procedure.  There is no need for concern and it should clear up in a day or so.  SYMPTOMS TO REPORT IMMEDIATELY:  Following lower endoscopy (colonoscopy or flexible sigmoidoscopy):  Excessive amounts of blood in the stool  Significant tenderness or worsening of abdominal pains  Swelling of the abdomen that is new, acute  Fever of 100F or higher   For urgent or emergent issues, a gastroenterologist can be reached at any hour by calling [phone]. Do not use MyChart messaging for urgent concerns.    DIET:  Follow a High Fiber Diet (see handout given to you by your recovery nurse). We do recommend a small meal at first, but then you may proceed to your regular diet.  Drink plenty of fluids but you should avoid alcoholic beverages for 24 hours.  MEDICATIONS: Continue present medications. Start Ciprofloxacin  500 mg by mouth twice daily for 3 days (for urinary tract infection).  Please see handouts given to you by your recovery nurse.  Thank you for allowing [place] to provide for your healthcare needs today.  ACTIVITY:  You should plan to take it easy for the rest of today and you should NOT DRIVE or use heavy machinery until tomorrow (because of the sedation medicines used during the test).    FOLLOW UP: Our staff will call the number listed on your records 48-72 hours following your procedure to check on you and address any questions or concerns that you may have regarding the information given to you following your procedure. If we do not reach you, we will leave a message.  We will attempt to reach you two times.  During this call, we will ask if you have developed any symptoms of COVID 19. If you develop any symptoms (ie: fever, flu-like symptoms, shortness of breath, cough etc.) before then, please call [phone].  If you test positive for Covid 19 in the 2 weeks post procedure, please call and report this information to [place].    If any biopsies were taken you will be contacted by phone or by letter within the next 1-3 weeks.  Please call us at [phone] if you have not heard about the biopsies in 3 weeks.    SIGNATURES/CONFIDENTIALITY: You and/or your care partner have signed paperwork which will be entered into your electronic medical record.  These signatures attest to the fact that that the information above on your After Visit Summary has been reviewed and is understood.  Full responsibility of the confidentiality of this discharge information lies with you and/or your care-partner.  

## 2021-08-10 NOTE — Progress Notes (Signed)
[facility] History and Physical   Primary Care Physician:  Camillia Herter, NP   Reason for Procedure:   History of polyps  Plan:     colonoscopy     HPI: Tamara Lutz is a 60 y.o. female    Past Medical History:  Diagnosis Date   Allergy    Hyperlipidemia    Osteopenia     Past Surgical History:  Procedure Laterality Date   CESAREAN SECTION     CESAREAN SECTION N/A    [employer] 07/31/2020   COLONOSCOPY  2019   Dr.Dail Meece   DILATION AND CURETTAGE OF UTERUS     MOUTH SURGERY     POLYPECTOMY      Prior to Admission medications   Medication Sig Start Date End Date Taking? Authorizing Provider  atorvastatin (LIPITOR) 20 MG tablet TAKE 1 TABLET BY MOUTH ONCE DAILY AT [facility] 05/24/21  Yes Camillia Herter, NP  Calcium Carb-Cholecalciferol 600-400 MG-UNIT CAPS Take 1 capsule by mouth.   Yes [provider]  cetirizine (ZYRTEC) 10 MG chewable tablet Chew 10 mg by mouth daily.   Yes [provider]  fluticasone (FLONASE) 50 MCG/ACT nasal spray Place 1 spray into both nostrils 2 (two) times daily. 02/09/19  Yes Stallings, Zoe A, MD  montelukast (SINGULAIR) 10 MG tablet Take 1 tablet (10 mg total) by mouth at bedtime. 08/03/20  Yes Just, Laurita Quint, FNP  Multiple Vitamin (MULTIVITAMIN) tablet Take 1 tablet by mouth daily.   Yes [provider]  rizatriptan (MAXALT) 10 MG tablet Take 1 tablet (10 mg total) by mouth as needed for migraine. May repeat in 2 hours if needed 05/11/21  Yes Minette Brine, Amy J, NP  diphenhydrAMINE (BENADRYL) 25 mg capsule Take 25 mg by mouth every 6 (six) hours as needed. Patient not taking: Reported on 08/10/2021    [provider]  ibandronate (BONIVA) 150 MG tablet Take 150 mg by mouth every 30 (thirty) days. Take in the morning with a full glass of water, on an empty stomach, and do not take anything else by mouth or lie down for the next 30 min.    [provider]    Current Outpatient Medications   Medication Sig Dispense Refill   atorvastatin (LIPITOR) 20 MG tablet TAKE 1 TABLET BY MOUTH ONCE DAILY AT 6PM 90 tablet 3   Calcium Carb-Cholecalciferol 600-400 MG-UNIT CAPS Take 1 capsule by mouth.     cetirizine (ZYRTEC) 10 MG chewable tablet Chew 10 mg by mouth daily.     fluticasone (FLONASE) 50 MCG/ACT nasal spray Place 1 spray into both nostrils 2 (two) times daily. 48 g 3   montelukast (SINGULAIR) 10 MG tablet Take 1 tablet (10 mg total) by mouth at bedtime. 90 tablet 3   Multiple Vitamin (MULTIVITAMIN) tablet Take 1 tablet by mouth daily.     rizatriptan (MAXALT) 10 MG tablet Take 1 tablet (10 mg total) by mouth as needed for migraine. May repeat in 2 hours if needed 10 tablet 2   diphenhydrAMINE (BENADRYL) 25 mg capsule Take 25 mg by mouth every 6 (six) hours as needed. (Patient not taking: Reported on 08/10/2021)     ibandronate (BONIVA) 150 MG tablet Take 150 mg by mouth every 30 (thirty) days. Take in the morning with a full glass of water, on an empty stomach, and do not take anything else by mouth or lie down for the next 30 min.     Current Facility-Administered Medications  Medication Dose Route  Frequency Provider Last Rate Last Admin   0.9 %  sodium chloride infusion  500 mL Intravenous Once Jackquline Denmark, MD        Allergies as of 08/10/2021   (No Known Allergies)    Family History  Problem Relation Age of Onset   Multiple sclerosis Mother    Uterine cancer Mother    Alzheimer's disease Father    Breast cancer Sister 60   Diabetes Neg Hx    Heart disease Neg Hx    Hypertension Neg Hx    Colon cancer Neg Hx    Esophageal cancer Neg Hx    Rectal cancer Neg Hx    Stomach cancer Neg Hx     Social History   Socioeconomic History   Marital status: Married    Spouse name: Not on file   Number of children: 5   Years of education: 16   Highest education level: Not on file  Occupational History   Not on file  Tobacco Use   Smoking status: Never   Smokeless  tobacco: Never  Vaping Use   Vaping Use: Never used  Substance and Sexual Activity   Alcohol use: Yes    Comment: occasionally-2 per month   Drug use: No   Sexual activity: Yes  Other Topics Concern   Not on file  Social History Narrative   Right handed   One story home   Drinks caffeine    Social Determinants of Health   Financial Resource Strain: Not on file  Food Insecurity: Not on file  Transportation Needs: Not on file  Physical Activity: Not on file  Stress: Not on file  Social Connections: Not on file  Intimate Partner Violence: Not on file    Review of Systems: Positive for none All other review of systems negative except as mentioned in the HPI.  Physical Exam: Vital signs in last 24 hours: @VSRANGES @   General:   Alert,  Well-developed, well-nourished, pleasant and cooperative in NAD Lungs:  Clear throughout to auscultation.   Heart:  Regular rate and rhythm; no murmurs, clicks, rubs,  or gallops. Abdomen:  Soft, nontender and nondistended. Normal bowel sounds.   Neuro/Psych:  Alert and cooperative. Normal mood and affect. A and O x 3    No significant changes were identified.  The patient continues to be an appropriate candidate for the planned procedure and anesthesia.   Carmell Austria, MD. [facility] 08/10/2021 10:18 AM@

## 2021-08-10 NOTE — Op Note (Addendum)
[place] Patient Name: Tamara Lutz Procedure Date: 08/10/2021 10:18 AM MRN: [id number] Endoscopist: Jackquline Denmark , MD Age: 60 Referring MD:  Date of Birth: 14-Aug-1961 Gender: Female Account #: [id number] Procedure:                Colonoscopy Indications:              High risk colon cancer surveillance: Personal                            history of colonic polyps Medicines:                Monitored Anesthesia Care Procedure:                Pre-Anesthesia Assessment:                           - Prior to the procedure, a History and Physical                            was performed, and patient medications and                            allergies were reviewed. The patient's tolerance of                            previous anesthesia was also reviewed. The risks                            and benefits of the procedure and the sedation                            options and risks were discussed with the patient.                            All questions were answered, and informed consent                            was obtained. Prior Anticoagulants: The patient has                            taken no previous anticoagulant or antiplatelet                            agents. ASA Grade Assessment: II - A patient with                            mild systemic disease. After reviewing the risks                            and benefits, the patient was deemed in                            satisfactory condition to undergo the procedure.  After obtaining informed consent, the colonoscope                            was passed under direct vision. Throughout the                            procedure, the patient's blood pressure, pulse, and                            oxygen saturations were monitored continuously. The                            Olympus PCF-H190DL (#[id number]) Colonoscope was                            introduced through the anus and advanced to  the 2                            cm into the ileum. The colonoscopy was performed                            without difficulty. The patient tolerated the                            procedure well. The quality of the bowel                            preparation was good. The terminal ileum, ileocecal                            valve, appendiceal orifice, and rectum were                            photographed. Scope In: 10:32:53 AM Scope Out: 10:49:21 AM Scope Withdrawal Time: 0 hours 6 minutes 29 seconds  Total Procedure Duration: 0 hours 16 minutes 28 seconds  Findings:                 A 8 mm polyp was found in the distal rectum. The                            polyp was sessile. The polyp was removed with a hot                            snare. Resection and retrieval were complete.                           Multiple medium-mouthed diverticula were found in                            the sigmoid colon and ascending colon.                           Non-bleeding internal hemorrhoids were found during  retroflexion. The hemorrhoids were small and Grade                            I (internal hemorrhoids that do not prolapse).                           The terminal ileum appeared normal.                           The exam was otherwise without abnormality on                            direct and retroflexion views. Complications:            No immediate complications. Estimated Blood Loss:     Estimated blood loss: none. Impression:               - One 8 mm polyp in the distal rectum, removed with                            a hot snare. Resected and retrieved.                           - Diverticulosis in the sigmoid colon and in the                            ascending colon.                           - Non-bleeding internal hemorrhoids.                           - The examined portion of the ileum was normal.                           - The examination was  otherwise normal on direct                            and retroflexion views. Recommendation:           - Patient has a contact number available for                            emergencies. The signs and symptoms of potential                            delayed complications were discussed with the                            patient. Return to normal activities tomorrow.                            Written discharge instructions were provided to the                            patient.                           -  High fiber diet.                           - Continue present medications.                           - Await pathology results.                           - Repeat colonoscopy in 5 years for surveillance                            based on pathology results ( taking into account                            previous history of polyps).                           - The findings and recommendations were discussed                            with the patient's family.                           - Would give her Cipro 500 mg p.o. twice daily x                            for 3 days for UTI Jackquline Denmark, MD 08/10/2021 10:54:00 AM This report has been signed electronically.

## 2021-08-11 ENCOUNTER — Emergency Department (HOSPITAL_COMMUNITY): Payer: Managed Care, Other (non HMO) | Admitting: Registered Nurse

## 2021-08-11 ENCOUNTER — Ambulatory Visit (HOSPITAL_COMMUNITY)
Admission: EM | Admit: 2021-08-11 | Discharge: 2021-08-11 | Disposition: A | Discharge status: Home / Self Care | Payer: Managed Care, Other (non HMO) | Attending: Emergency Medicine | Admitting: Emergency Medicine

## 2021-08-11 ENCOUNTER — Encounter (HOSPITAL_COMMUNITY)
Admission: EM | Disposition: A | Discharge status: Home / Self Care | Payer: Self-pay | Source: Home / Self Care | Attending: Emergency Medicine

## 2021-08-11 ENCOUNTER — Other Ambulatory Visit: Payer: Self-pay

## 2021-08-11 ENCOUNTER — Encounter (HOSPITAL_COMMUNITY): Payer: Self-pay

## 2021-08-11 DIAGNOSIS — K626 Ulcer of anus and rectum: Secondary | ICD-10-CM | POA: Diagnosis not present

## 2021-08-11 DIAGNOSIS — K625 Hemorrhage of anus and rectum: Secondary | ICD-10-CM | POA: Insufficient documentation

## 2021-08-11 DIAGNOSIS — K922 Gastrointestinal hemorrhage, unspecified: Secondary | ICD-10-CM

## 2021-08-11 DIAGNOSIS — Z79899 Other long term (current) drug therapy: Secondary | ICD-10-CM | POA: Diagnosis not present

## 2021-08-11 DIAGNOSIS — Z86010 Personal history of colonic polyps: Secondary | ICD-10-CM | POA: Diagnosis not present

## 2021-08-11 DIAGNOSIS — K921 Melena: Secondary | ICD-10-CM | POA: Diagnosis not present

## 2021-08-11 DIAGNOSIS — Z9889 Other specified postprocedural states: Secondary | ICD-10-CM | POA: Diagnosis not present

## 2021-08-11 HISTORY — PX: SCLEROTHERAPY: SHX6841

## 2021-08-11 HISTORY — PX: HEMOSTASIS CLIP PLACEMENT: SHX6857

## 2021-08-11 HISTORY — PX: FLEXIBLE SIGMOIDOSCOPY: SHX5431

## 2021-08-11 LAB — BASIC METABOLIC PANEL
Anion gap: 7 (ref 5–15)
BUN: 14 mg/dL (ref 6–20)
CO2: 25 mmol/L (ref 22–32)
Calcium: 8.1 mg/dL — ABNORMAL LOW (ref 8.9–10.3)
Chloride: 109 mmol/L (ref 98–111)
Creatinine, Ser: 0.84 mg/dL (ref 0.44–1.00)
GFR, Estimated: >60 mL/min (ref >60)
Glucose, Bld: 95 mg/dL (ref 70–99)
Potassium: 3.4 mmol/L — ABNORMAL LOW (ref 3.5–5.1)
Sodium: 141 mmol/L (ref 135–145)

## 2021-08-11 LAB — CBC
HCT: 35.2 % — ABNORMAL LOW (ref 36.0–46.0)
Hemoglobin: 11.4 g/dL — ABNORMAL LOW (ref 12.0–15.0)
MCH: 29.8 pg (ref 26.0–34.0)
MCHC: 32.4 g/dL (ref 30.0–36.0)
MCV: 92.1 fL (ref 80.0–100.0)
Platelets: 187 10*3/uL (ref 150–400)
RBC: 3.82 MIL/uL — ABNORMAL LOW (ref 3.87–5.11)
RDW: 11.9 % (ref 11.5–15.5)
WBC: 4.4 10*3/uL (ref 4.0–10.5)
nRBC: 0 % (ref 0.0–0.2)

## 2021-08-11 SURGERY — SIGMOIDOSCOPY, FLEXIBLE
Anesthesia: Monitor Anesthesia Care

## 2021-08-11 MED ORDER — SODIUM CHLORIDE 0.9 % IV SOLN: INTRAVENOUS | Status: DC

## 2021-08-11 MED ORDER — SODIUM CHLORIDE (PF) 0.9 % IJ SOLN
PREFILLED_SYRINGE | INTRAMUSCULAR | Status: DC | PRN
  Administered 2021-08-11: 1 mL

## 2021-08-11 MED ORDER — PROPOFOL 1000 MG/100ML IV EMUL
INTRAVENOUS | Status: AC
  Filled 2021-08-11: qty 100

## 2021-08-11 MED ORDER — DEXMEDETOMIDINE (PRECEDEX) IN NS 20 MCG/5ML (4 MCG/ML) IV SYRINGE
PREFILLED_SYRINGE | INTRAVENOUS | Status: AC
  Filled 2021-08-11: qty 10

## 2021-08-11 MED ORDER — PROPOFOL 10 MG/ML IV BOLUS
INTRAVENOUS | Status: DC | PRN
  Administered 2021-08-11: 70 mg via INTRAVENOUS
  Administered 2021-08-11: 30 mg via INTRAVENOUS
  Administered 2021-08-11: 40 mg via INTRAVENOUS

## 2021-08-11 MED ORDER — LIDOCAINE HCL (CARDIAC) PF 100 MG/5ML IV SOSY
PREFILLED_SYRINGE | INTRAVENOUS | Status: DC | PRN
  Administered 2021-08-11: 60 mg via INTRAVENOUS

## 2021-08-11 MED ORDER — LACTATED RINGERS IV SOLN
INTRAVENOUS | Status: AC | PRN
  Administered 2021-08-11: 20 mL/h via INTRAVENOUS

## 2021-08-11 MED ORDER — PHENYLEPHRINE HCL (PRESSORS) 10 MG/ML IV SOLN
INTRAVENOUS | Status: DC | PRN
  Administered 2021-08-11: 40 ug via INTRAVENOUS
  Administered 2021-08-11: 80 ug via INTRAVENOUS

## 2021-08-11 MED ORDER — PROPOFOL 500 MG/50ML IV EMUL
INTRAVENOUS | Status: DC | PRN
  Administered 2021-08-11: 120 ug/kg/min via INTRAVENOUS

## 2021-08-11 MED ORDER — EPINEPHRINE 1 MG/10ML IJ SOSY
PREFILLED_SYRINGE | INTRAMUSCULAR | Status: AC
  Filled 2021-08-11: qty 10

## 2021-08-11 NOTE — Consult Note (Signed)
 Referring Provider:  [place], [facility] ED provider          Primary Care Physician:  Stephens, Amy J, NP Primary Gastroenterologist:   Gupta          Reason for Consultation:  rectal bleeding                 ASSESSMENT /  PLAN   60-year-old female with post polypectomy bleeding after colonoscopy with hot snare of the distal rectal polyp yesterday  1.  Post polypectomy hemorrhage --hemodynamically stable, CBC is pending but patient is without signs of orthostasis.  Plan for flexible sigmoidoscopy this morning for hemostasis.  I reviewed the risk, benefits and alternatives and the patient is agreeable and wishes to proceed.  I have discussed this with my colleague Dr. Hung who has agreed to perform the procedure.  I discussed no sedation, moderate sedation and MAC with the patient and she prefers MAC.  Anesthesia team is available --Urgent flexible sigmoidoscopy for hemostasis after post polypectomy hemorrhage --CBC pending --Assuming adequate hemostasis expect she can discharge after procedure    HPI:     Tamara Lutz is a 60 y.o. female with a past medical history of colonic polyps and little other past medical history who had her surveillance colonoscopy colonoscopy yesterday.  At that time an 8 mm distal rectal polyp was removed with hot snare.  Within a few hours of returning home she had red blood and clots per rectum.  She has had 7-10 of these episodes since this time.  Mild urgency but no abdominal pain.  No fever.  No chest pain or shortness of breath.  No lightheadedness.  Interestingly she shares that her sister who has had a history of polyps also had a post polypectomy hemorrhage in the past and told her she should come to the ER.  The patient called me this morning around 5:40 AM and I instructed her to come to the ER where I met her.   Past Medical History:  Diagnosis Date   Allergy    Hyperlipidemia    Osteopenia     Past Surgical History:  Procedure  Laterality Date   CESAREAN SECTION     CESAREAN SECTION N/A    [employer] 07/31/2020   COLONOSCOPY  2019   Dr.Gupta   DILATION AND CURETTAGE OF UTERUS     MOUTH SURGERY     POLYPECTOMY      Prior to Admission medications   Medication Sig Start Date End Date Taking? Authorizing Provider  atorvastatin (LIPITOR) 20 MG tablet TAKE 1 TABLET BY MOUTH ONCE DAILY AT 6PM 05/24/21   Stephens, Amy J, NP  Calcium Carb-Cholecalciferol 600-400 MG-UNIT CAPS Take 1 capsule by mouth.    [provider]  cetirizine (ZYRTEC) 10 MG chewable tablet Chew 10 mg by mouth daily.    [provider]  ciprofloxacin (CIPRO) 500 MG tablet Take 1 tablet (500 mg total) by mouth 2 (two) times daily. 08/10/21   Gupta, Rajesh, MD  diphenhydrAMINE (BENADRYL) 25 mg capsule Take 25 mg by mouth every 6 (six) hours as needed. Patient not taking: Reported on 08/10/2021    [provider]  fluticasone (FLONASE) 50 MCG/ACT nasal spray Place 1 spray into both nostrils 2 (two) times daily. 02/09/19   Stallings, Zoe A, MD  ibandronate (BONIVA) 150 MG tablet Take 150 mg by mouth every 30 (thirty) days. Take in the morning with a full glass of water, on an empty stomach, and do   not take anything else by mouth or lie down for the next 30 min.    [provider]  montelukast (SINGULAIR) 10 MG tablet Take 1 tablet (10 mg total) by mouth at bedtime. 08/03/20   Just, Laurita Quint, FNP  Multiple Vitamin (MULTIVITAMIN) tablet Take 1 tablet by mouth daily.    [provider]  rizatriptan (MAXALT) 10 MG tablet Take 1 tablet (10 mg total) by mouth as needed for migraine. May repeat in 2 hours if needed 05/11/21   Camillia Herter, NP    Current Facility-Administered Medications  Medication Dose Route Frequency Provider Last Rate Last Admin   0.9 %  sodium chloride infusion  500 mL Intravenous Once Jackquline Denmark, MD       Current Outpatient Medications  Medication Sig Dispense Refill   atorvastatin (LIPITOR) 20  MG tablet TAKE 1 TABLET BY MOUTH ONCE DAILY AT 6PM 90 tablet 3   Calcium Carb-Cholecalciferol 600-400 MG-UNIT CAPS Take 1 capsule by mouth.     cetirizine (ZYRTEC) 10 MG chewable tablet Chew 10 mg by mouth daily.     ciprofloxacin (CIPRO) 500 MG tablet Take 1 tablet (500 mg total) by mouth 2 (two) times daily. 6 tablet 0   diphenhydrAMINE (BENADRYL) 25 mg capsule Take 25 mg by mouth every 6 (six) hours as needed. (Patient not taking: Reported on 08/10/2021)     fluticasone (FLONASE) 50 MCG/ACT nasal spray Place 1 spray into both nostrils 2 (two) times daily. 48 g 3   ibandronate (BONIVA) 150 MG tablet Take 150 mg by mouth every 30 (thirty) days. Take in the morning with a full glass of water, on an empty stomach, and do not take anything else by mouth or lie down for the next 30 min.     montelukast (SINGULAIR) 10 MG tablet Take 1 tablet (10 mg total) by mouth at bedtime. 90 tablet 3   Multiple Vitamin (MULTIVITAMIN) tablet Take 1 tablet by mouth daily.     rizatriptan (MAXALT) 10 MG tablet Take 1 tablet (10 mg total) by mouth as needed for migraine. May repeat in 2 hours if needed 10 tablet 2    Allergies as of 08/11/2021   (No Known Allergies)    Family History  Problem Relation Age of Onset   Multiple sclerosis Mother    Uterine cancer Mother    Alzheimer's disease Father    Breast cancer Sister 80   Diabetes Neg Hx    Heart disease Neg Hx    Hypertension Neg Hx    Colon cancer Neg Hx    Esophageal cancer Neg Hx    Rectal cancer Neg Hx    Stomach cancer Neg Hx     Social History   Tobacco Use   Smoking status: Never   Smokeless tobacco: Never  Vaping Use   Vaping Use: Never used  Substance Use Topics   Alcohol use: Yes    Comment: occasionally-2 per month   Drug use: No    Review of Systems: All systems reviewed and negative except where noted in HPI.  Physical Exam: Vital signs in last 24 hours: Temp:  [98.5 F (36.9 C)-98.9 F (37.2 C)] 98.5 F (36.9 C) (11/04  0639) Pulse Rate:  [55-78] 74 (11/04 0639) Resp:  [8-19] 19 (11/04 0639) BP: (97-154)/(52-80) 133/73 (11/04 0639) SpO2:  [97 %-100 %] 100 % (11/04 0639) Weight:  [61.2 kg-61.7 kg] 61.7 kg (11/04 3817)   Gen: awake, alert, NAD HEENT: anicteric, op clear CV: RRR,  no mrg Pulm: CTA b/l Abd: soft, NT/ND, +BS throughout Ext: no c/c/e Neuro: nonfocal   . CBC Latest Ref Rng & Units 05/24/2021 08/03/2020 10/19/2019  WBC 3.4 - 10.8 x10E3/uL 5.9 5.7 5.9  Hemoglobin 11.1 - 15.9 g/dL 13.7 13.7 13.7  Hematocrit 34.0 - 46.6 % 41.8 40.9 41.8  Platelets 150 - 450 x10E3/uL 249 251 245    . CMP Latest Ref Rng & Units 05/24/2021 10/13/2020 08/22/2020  Glucose 65 - 99 mg/dL 71 76 83  BUN 6 - 24 mg/dL _0 Creatinine 0.57 - 1.00 mg/dL 0.85 0.90 0.82  Sodium 134 - 144 mmol/L 139 141 141  Potassium 3.5 - 5.2 mmol/L 4.4 4.2 4.1  Chloride 96 - 106 mmol/L 101 103 102  CO2 20 - 29 mmol/L _1 Calcium 8.7 - 10.2 mg/dL 9.7 9.8 11.0(H)  Total Protein 6.0 - 8.5 g/dL 7.1 7.2 7.5  Total Bilirubin 0.0 - 1.2 mg/dL 0.4 0.3 0.3  Alkaline Phos 44 - 121 IU/L 74 72 76  AST 0 - 40 IU/L 25 33 24  ALT 0 - 32 IU/L 21 42(H) 19   Studies/Results: No results found.  Active Problems:   * No active hospital problems. Lajuan Lines. Eliah Ozawa, M.D. @  08/11/2021, 7:29 AM

## 2021-08-11 NOTE — Anesthesia Preprocedure Evaluation (Addendum)
Anesthesia Evaluation  Patient identified by MRN, date of birth, ID band Patient awake    Reviewed: Allergy & Precautions, NPO status , Patient's Chart, lab work & pertinent test results  Airway Mallampati: I       Dental no notable dental hx.    Pulmonary neg pulmonary ROS,    Pulmonary exam normal        Cardiovascular negative cardio ROS Normal cardiovascular exam     Neuro/Psych negative psych ROS   GI/Hepatic negative GI ROS, Neg liver ROS,   Endo/Other  negative endocrine ROS  Renal/GU negative Renal ROS  negative genitourinary   Musculoskeletal negative musculoskeletal ROS (+)   Abdominal Normal abdominal exam  (+)   Peds  Hematology  (+) anemia ,   Anesthesia Other Findings   Reproductive/Obstetrics                             Anesthesia Physical Anesthesia Plan  ASA: 1  Anesthesia Plan: MAC   Post-op Pain Management:    Induction:   PONV Risk Score and Plan: 2 and Treatment may vary due to age or medical condition  Airway Management Planned: Natural Airway, Nasal Cannula and Simple Face Mask  Additional Equipment: None  Intra-op Plan:   Post-operative Plan:   Informed Consent: I have reviewed the patients History and Physical, chart, labs and discussed the procedure including the risks, benefits and alternatives for the proposed anesthesia with the patient or authorized representative who has indicated his/her understanding and acceptance.       Plan Discussed with: CRNA  Anesthesia Plan Comments:         Anesthesia Quick Evaluation

## 2021-08-11 NOTE — Op Note (Signed)
[facility] Patient Name: Tamara Lutz Procedure Date: 08/11/2021 MRN: [id number] Attending MD: Carol Ada , MD Date of Birth: 1961-07-23 CSN: [id number] Age: 60 Admit Type: Outpatient Procedure:                Flexible Sigmoidoscopy Indications:              Hematochezia Providers:                Carol Ada, MD, Ervin Knack, Tyna Jaksch                            Technician Referring MD:              Medicines:                Propofol per Anesthesia Complications:            No immediate complications. Estimated Blood Loss:     Estimated blood loss: none. Procedure:                Pre-Anesthesia Assessment:                           - Prior to the procedure, a History and Physical                            was performed, and patient medications and                            allergies were reviewed. The patient's tolerance of                            previous anesthesia was also reviewed. The risks                            and benefits of the procedure and the sedation                            options and risks were discussed with the patient.                            All questions were answered, and informed consent                            was obtained. Prior Anticoagulants: The patient has                            taken no previous anticoagulant or antiplatelet                            agents. ASA Grade Assessment: I - A normal, healthy                            patient. After reviewing the risks and benefits,                            the patient was deemed  in satisfactory condition to                            undergo the procedure.                           - Sedation was administered by an anesthesia                            professional. Deep sedation was attained.                           After obtaining informed consent, the scope was                            passed under direct vision. The [id number]-[id number] ([id number])                             Olympus endoscope was introduced through the anus                            and advanced to the the sigmoid colon. The flexible                            sigmoidoscopy was somewhat difficult. The quality                            of the bowel preparation was good. Scope In: Scope Out: Findings:      A single (solitary) four mm ulcer was found in the rectum. Oozing was       present. Stigmata of recent bleeding were present. Area was successfully       injected with 1 mL of a 1:10,000 solution of epinephrine for hemostasis.       For hemostasis, three hemostatic clips were successfully placed (MR       conditional). There was no bleeding at the end of the procedure.      The initial entry into the rectum showed that there was a significant       amount of blood clots. A portion of the blood clot was removed with       snare disruption, suctioning with repeated withdrawal of the endoscope,       however, the visual field was still obscured. Digitally, the clot was       palpated and broken up. It was also able to be delivered from the       rectum. The clot that was removed was extremely large. The post       polypectomy site was exposed and active bleeding was noted in the form       of rapid oozing. Epinephrine 1:10,000 was injected and this helped to       slow the bleeding. In a retroflexed position three hemoclips were       successfully deployed. The bleeding was arrested. Impression:               - A single (solitary) ulcer in the rectum.  Injected. Clips (MR conditional) were placed.                           - No specimens collected. Moderate Sedation:      Not Applicable - Patient had care per Anesthesia. Recommendation:           - Patient has a contact number available for                            emergencies. The signs and symptoms of potential                            delayed complications were discussed with the                             patient. Return to normal activities tomorrow.                            Written discharge instructions were provided to the                            patient.                           - Resume regular diet.                           - Follow up with Dr. Lyndel Safe in 2-4 weeks. Procedure Code(s):        --- Professional ---                           [phone], Sigmoidoscopy, flexible; with control of                            bleeding, any method Diagnosis Code(s):        --- Professional ---                           K62.6, Ulcer of anus and rectum                           K92.1, Melena (includes Hematochezia) CPT copyright 2019 American [employer] Association. All rights reserved. The codes documented in this report are preliminary and upon coder review may  be revised to meet current compliance requirements. Carol Ada, MD Carol Ada, MD 08/11/2021 8:58:10 AM This report has been signed electronically. Number of Addenda: 0

## 2021-08-11 NOTE — Discharge Instructions (Signed)

## 2021-08-11 NOTE — ED Notes (Signed)
Type and screen pulled and labelled, at bedside if needed prior to procedure.

## 2021-08-11 NOTE — ED Triage Notes (Signed)
Patient arrives from home with complaint of excessive bleeding after colonoscopy. Patient had procedure done yesterday and states she has been bleeding since. Pt endorses fatigue and abdominal cramping

## 2021-08-11 NOTE — Anesthesia Postprocedure Evaluation (Signed)
Anesthesia Post Note  Patient: Tamara Lutz  Procedure(s) Performed: [facility] PLACEMENT     Patient location during evaluation: Endoscopy Anesthesia Type: MAC Level of consciousness: awake Pain management: pain level controlled Respiratory status: spontaneous breathing Cardiovascular status: stable Postop Assessment: no apparent nausea or vomiting Anesthetic complications: no   No notable events documented.  Last Vitals:  Vitals:   08/11/21 0903 08/11/21 0913  BP: 112/65 111/75  Pulse: (!) 51 (!) 53  Resp: 16 13  Temp:    SpO2: 94% 95%    Last Pain:  Vitals:   08/11/21 0913  TempSrc:   PainSc: 0-No pain                 Huston Foley

## 2021-08-11 NOTE — Transfer of Care (Signed)
Immediate Anesthesia Transfer of Care Note  Patient: Tamara Lutz  Procedure(s) Performed: [facility] HEMOSTASIS CLIP PLACEMENT  Patient Location: PACU and Endoscopy Unit  Anesthesia Type:MAC  Level of Consciousness: awake, alert , oriented and patient cooperative  Airway & Oxygen Therapy: Patient Spontanous Breathing and Patient connected to face mask oxygen  Post-op Assessment: Report given to RN, Post -op Vital signs reviewed and stable and Patient moving all extremities  Post vital signs: Reviewed and stable  Last Vitals:  Vitals Value Taken Time  BP    Temp    Pulse    Resp    SpO2      Last Pain:  Vitals:   08/11/21 0736  TempSrc: Oral  PainSc: 0-No pain         Complications: No notable events documented.

## 2021-08-11 NOTE — ED Provider Notes (Signed)
[place] DEPT Provider Note   CSN: [id number] Arrival date & time: 08/11/21  7867     History Chief Complaint  Patient presents with   Rectal Bleeding    Tamara Lutz is a 60 y.o. female.  Patient here for rectal bleeding after colonoscopy yesterday where she had a polyp removed.  Has already talked to her GI team who is aware of her being here.  Denies any syncopal symptoms.  Has been passing clots intermittently.  The history is provided by the patient.  Rectal Bleeding Quality:  Bright red Amount:  Moderate Duration:  1 day Timing:  Intermittent Associated symptoms: no abdominal pain, no fever and no vomiting       Past Medical History:  Diagnosis Date   Allergy    Hyperlipidemia    Osteopenia     Patient Active Problem List   Diagnosis Date Noted   Rectal bleeding    H/O colonoscopy with polypectomy    Insomnia 05/24/2021   Osteoporosis 06/02/2019   Migraine 06/02/2019   Hyperlipidemia 06/02/2019    Past Surgical History:  Procedure Laterality Date   CESAREAN SECTION     CESAREAN SECTION N/A    Phreesia 07/31/2020   COLONOSCOPY  2019   Dr.Gupta   DILATION AND CURETTAGE OF UTERUS     MOUTH SURGERY     POLYPECTOMY       OB History   No obstetric history on file.     Family History  Problem Relation Age of Onset   Multiple sclerosis Mother    Uterine cancer Mother    Alzheimer's disease Father    Breast cancer Sister 44   Diabetes Neg Hx    Heart disease Neg Hx    Hypertension Neg Hx    Colon cancer Neg Hx    Esophageal cancer Neg Hx    Rectal cancer Neg Hx    Stomach cancer Neg Hx     Social History   Tobacco Use   Smoking status: Never   Smokeless tobacco: Never  Vaping Use   Vaping Use: Never used  Substance Use Topics   Alcohol use: Yes    Comment: occasionally-2 per month   Drug use: No    Home Medications Prior to Admission medications   Medication Sig Start Date End Date  Taking? Authorizing Provider  atorvastatin (LIPITOR) 20 MG tablet TAKE 1 TABLET BY MOUTH ONCE DAILY AT [facility] 05/24/21   Camillia Herter, NP  Calcium Carb-Cholecalciferol 600-400 MG-UNIT CAPS Take 1 capsule by mouth.    [provider]  cetirizine (ZYRTEC) 10 MG chewable tablet Chew 10 mg by mouth daily.    [provider]  ciprofloxacin (CIPRO) 500 MG tablet Take 1 tablet (500 mg total) by mouth 2 (two) times daily. 08/10/21   Jackquline Denmark, MD  diphenhydrAMINE (BENADRYL) 25 mg capsule Take 25 mg by mouth every 6 (six) hours as needed. Patient not taking: Reported on 08/10/2021    [provider]  fluticasone (FLONASE) 50 MCG/ACT nasal spray Place 1 spray into both nostrils 2 (two) times daily. 02/09/19   Forrest Moron, MD  ibandronate (BONIVA) 150 MG tablet Take 150 mg by mouth every 30 (thirty) days. Take in the morning with a full glass of water, on an empty stomach, and do not take anything else by mouth or lie down for the next 30 min.    [provider]  montelukast (SINGULAIR) 10 MG tablet Take 1 tablet (10  mg total) by mouth at bedtime. 08/03/20   Just, Laurita Quint, FNP  Multiple Vitamin (MULTIVITAMIN) tablet Take 1 tablet by mouth daily.    [provider]  rizatriptan (MAXALT) 10 MG tablet Take 1 tablet (10 mg total) by mouth as needed for migraine. May repeat in 2 hours if needed 05/11/21   Camillia Herter, NP    Allergies    Patient has no known allergies.  Review of Systems   Review of Systems  Constitutional:  Negative for chills and fever.  HENT:  Negative for ear pain and sore throat.   Eyes:  Negative for pain and visual disturbance.  Respiratory:  Negative for cough and shortness of breath.   Cardiovascular:  Negative for chest pain and palpitations.  Gastrointestinal:  Positive for anal bleeding, blood in stool and hematochezia. Negative for abdominal distention, abdominal pain, constipation, diarrhea, nausea, rectal pain and vomiting.   Genitourinary:  Negative for dysuria and hematuria.  Musculoskeletal:  Negative for arthralgias, back pain and myalgias.  Skin:  Negative for color change and rash.  Neurological:  Negative for seizures and syncope.  All other systems reviewed and are negative.  Physical Exam Updated Vital Signs BP (!) 142/92   Pulse 67   Temp 98.5 F (36.9 C) (Oral)   Resp 10   Ht 5\' 3"  (1.6 m)   Wt 61.7 kg   LMP 04/09/2012   SpO2 100%   BMI 24.09 kg/m   Physical Exam Vitals and nursing note reviewed.  Constitutional:      General: She is not in acute distress.    Appearance: She is well-developed. She is not ill-appearing.  HENT:     Head: Normocephalic and atraumatic.  Eyes:     Extraocular Movements: Extraocular movements intact.     Conjunctiva/sclera: Conjunctivae normal.     Pupils: Pupils are equal, round, and reactive to light.  Cardiovascular:     Rate and Rhythm: Normal rate and regular rhythm.     Pulses: Normal pulses.     Heart sounds: Normal heart sounds. No murmur heard. Pulmonary:     Effort: Pulmonary effort is normal. No respiratory distress.     Breath sounds: Normal breath sounds.  Abdominal:     Palpations: Abdomen is soft.     Tenderness: There is no abdominal tenderness. There is no guarding.  Musculoskeletal:     Cervical back: Neck supple.  Skin:    General: Skin is warm and dry.     Capillary Refill: Capillary refill takes less than 2 seconds.  Neurological:     General: No focal deficit present.     Mental Status: She is alert.  Psychiatric:        Mood and Affect: Mood normal.    ED Results / Procedures / Treatments   Labs (all labs ordered are listed, but only abnormal results are displayed) Labs Reviewed  CBC - Abnormal; Notable for the following components:      Result Value   RBC 3.82 (*)    Hemoglobin 11.4 (*)    HCT 35.2 (*)    All other components within normal limits  BASIC METABOLIC PANEL    EKG None  Radiology No results  found.  Procedures Procedures   Medications Ordered in ED Medications  0.9 %  sodium chloride infusion ( Intravenous Not Given 08/11/21 0738)  lactated ringers infusion ( Intravenous Continued from Pre-op 08/11/21 6759)    ED Course  I have reviewed the triage vital  signs and the nursing notes.  Pertinent labs & imaging results that were available during my care of the patient were reviewed by me and considered in my medical decision making (see chart for details).    MDM Rules/Calculators/A&P                           [name] is here with rectal bleeding after colonoscopy yesterday.  Normal vitals.  No fever.  No abdominal tenderness.  Had a polyp removed yesterday.  Has had intermittent passing of blood clots and bleeding since.  Called her GI team who recommended that she come to the ED for evaluation.  Dr. Hilarie Fredrickson at the bedside upon my evaluation of the patient.  They are recommending to take the patient for flex sig to evaluate for signs of bleeding/treatment.  Hemodynamically she stable.  Been is 11.4.  Patient to endoscopy with GI.  Anticipate possibly discharge from endoscopy or possible admission depending on outcome of endoscopy.  This chart was dictated using voice recognition software.  Despite best efforts to proofread,  errors can occur which can change the documentation meaning.   Final Clinical Impression(s) / ED Diagnoses Final diagnoses:  Acute GI bleeding    Rx / DC Orders ED Discharge Orders     None        Lennice Sites, DO 08/11/21 2725

## 2021-08-13 ENCOUNTER — Encounter (HOSPITAL_COMMUNITY): Payer: Self-pay | Admitting: Gastroenterology

## 2021-08-14 ENCOUNTER — Telehealth: Payer: Self-pay | Admitting: *Deleted

## 2021-08-14 ENCOUNTER — Telehealth: Payer: Self-pay

## 2021-08-14 NOTE — Telephone Encounter (Signed)
Attempted to call patient for their post-procedure follow-up call. No answer. Left voicemail.   

## 2021-08-14 NOTE — Telephone Encounter (Signed)
Attempted to reach patient for post-procedure f/u call. No answer. Left message for her to please not hesitate to call us if she has any questions/concerns regarding her care. 

## 2021-08-15 ENCOUNTER — Telehealth: Payer: Self-pay

## 2021-08-15 NOTE — Telephone Encounter (Signed)
-----   Message from Jackquline Denmark, MD sent at 08/11/2021  9:47 AM EDT ----- Regarding: FU in 2-4 weeks in my clinic Post polypectomy bleed. Has FS this AM  CBC prior to OV OV in 2-4 weeks  Thx  RG

## 2021-08-15 NOTE — Telephone Encounter (Signed)
OV for pt has been scheduled for 09/07/2021 @ 3:40 with Dr. Lyndel Safe in [place].Pt made aware  Address provided .  Order for labs placed in [place]. Pt made aware. Pt given address and hours of the Lab.  Pt verbalized understanding with all questions answered

## 2021-08-22 ENCOUNTER — Encounter: Payer: Self-pay | Admitting: Gastroenterology

## 2021-08-25 ENCOUNTER — Other Ambulatory Visit: Payer: Self-pay | Admitting: Family

## 2021-08-25 DIAGNOSIS — G43009 Migraine without aura, not intractable, without status migrainosus: Secondary | ICD-10-CM

## 2021-09-05 ENCOUNTER — Other Ambulatory Visit (INDEPENDENT_AMBULATORY_CARE_PROVIDER_SITE_OTHER): Payer: Managed Care, Other (non HMO)

## 2021-09-05 DIAGNOSIS — K922 Gastrointestinal hemorrhage, unspecified: Secondary | ICD-10-CM | POA: Diagnosis not present

## 2021-09-05 LAB — CBC WITH DIFFERENTIAL/PLATELET
Basophils Absolute: 0.1 10*3/uL (ref 0.0–0.1)
Basophils Relative: 1.4 % (ref 0.0–3.0)
Eosinophils Absolute: 0.1 10*3/uL (ref 0.0–0.7)
Eosinophils Relative: 2.6 % (ref 0.0–5.0)
HCT: 36.8 % (ref 36.0–46.0)
Hemoglobin: 11.9 g/dL — ABNORMAL LOW (ref 12.0–15.0)
Lymphocytes Relative: 26.8 % (ref 12.0–46.0)
Lymphs Abs: 1.5 10*3/uL (ref 0.7–4.0)
MCHC: 32.4 g/dL (ref 30.0–36.0)
MCV: 89.2 fl (ref 78.0–100.0)
Monocytes Absolute: 0.4 10*3/uL (ref 0.1–1.0)
Monocytes Relative: 7.1 % (ref 3.0–12.0)
Neutro Abs: 3.4 10*3/uL (ref 1.4–7.7)
Neutrophils Relative %: 62.1 % (ref 43.0–77.0)
Platelets: 242 10*3/uL (ref 150.0–400.0)
RBC: 4.13 Mil/uL (ref 3.87–5.11)
RDW: 12.9 % (ref 11.5–15.5)
WBC: 5.4 10*3/uL (ref 4.0–10.5)

## 2021-09-07 ENCOUNTER — Ambulatory Visit (INDEPENDENT_AMBULATORY_CARE_PROVIDER_SITE_OTHER): Payer: Managed Care, Other (non HMO) | Admitting: Gastroenterology

## 2021-09-07 ENCOUNTER — Other Ambulatory Visit: Payer: Self-pay

## 2021-09-07 ENCOUNTER — Encounter: Payer: Self-pay | Admitting: Gastroenterology

## 2021-09-07 VITALS — BP 128/82 | HR 67 | Ht 63.0 in | Wt 139.0 lb

## 2021-09-07 DIAGNOSIS — Z860100 Personal history of colon polyps, unspecified: Secondary | ICD-10-CM

## 2021-09-07 NOTE — Progress Notes (Signed)
Chief Complaint: FU  Referring Provider:  Camillia Herter, NP      ASSESSMENT AND PLAN;   #1. H/O colonic polyps 08/2021  #2. FH of colonic polyps (sis age < 55)  Plan: -Repeat colon in 5 years.  Earlier, if with any new problems or change in family history.   HPI:    Tamara Lutz is a 60 y.o. female  For follow-up visit  Underwent colon on 08/10/2021 with polypectomy of 8 mm distal rectal polyp removed by hot snare.  This was complicated by post polypectomy bleeding requiring flexible sigmoidoscopy with Endo clipping by Dr. Benson Norway on 08/11/2021.  She has done well.  No further bleeding.  Hemoglobin is improving.  She found out that her sister also had post polypectomy bleeding.  Colon 08/10/2021 - One 8 mm polyp in the distal rectum, removed with a hot snare. Resected and retrieved. - Diverticulosis in the sigmoid colon and in the ascending colon. - Non-bleeding internal hemorrhoids. - The examined portion of the ileum was normal. - Repeat in 5 years.  FS 08/11/2021 - A single (solitary) ulcer in the rectum. Injected. Clips (MR conditional) were placed. - No specimens collected.  CBC Latest Ref Rng & Units 09/05/2021 08/11/2021 05/24/2021  WBC 4.0 - 10.5 K/uL 5.4 4.4 5.9  Hemoglobin 12.0 - 15.0 g/dL 11.9(L) 11.4(L) 13.7  Hematocrit 36.0 - 46.0 % 36.8 35.2(L) 41.8  Platelets 150.0 - 400.0 K/uL 242.0 187 249    Past Medical History:  Diagnosis Date   Allergy    Hyperlipidemia    Osteopenia     Past Surgical History:  Procedure Laterality Date   CESAREAN SECTION     CESAREAN SECTION N/A    Phreesia 07/31/2020   COLONOSCOPY  2019   Dr.Karlton Maya   DILATION AND CURETTAGE OF UTERUS     FLEXIBLE SIGMOIDOSCOPY N/A 08/11/2021   Procedure: FLEXIBLE SIGMOIDOSCOPY;  Surgeon: Carol Ada, MD;  Location: [facility];  Service: Endoscopy;  Laterality: N/A;   HEMOSTASIS CLIP PLACEMENT  08/11/2021   Procedure: HEMOSTASIS CLIP PLACEMENT;  Surgeon: Carol Ada, MD;   Location: [facility];  Service: Endoscopy;;   MOUTH SURGERY     POLYPECTOMY     SCLEROTHERAPY  08/11/2021   Procedure: Clide Deutscher;  Surgeon: Carol Ada, MD;  Location: [facility];  Service: Endoscopy;;    Family History  Problem Relation Age of Onset   Multiple sclerosis Mother    Uterine cancer Mother    Alzheimer's disease Father    Breast cancer Sister 32   Diabetes Neg Hx    Heart disease Neg Hx    Hypertension Neg Hx    Colon cancer Neg Hx    Esophageal cancer Neg Hx    Rectal cancer Neg Hx    Stomach cancer Neg Hx     Social History   Tobacco Use   Smoking status: Never   Smokeless tobacco: Never  Vaping Use   Vaping Use: Never used  Substance Use Topics   Alcohol use: Yes    Comment: occasionally-2 per month   Drug use: No    Current Outpatient Medications  Medication Sig Dispense Refill   atorvastatin (LIPITOR) 20 MG tablet TAKE 1 TABLET BY MOUTH ONCE DAILY AT 6PM 90 tablet 3   Calcium Carb-Cholecalciferol 600-400 MG-UNIT CAPS Take 1 capsule by mouth.     cetirizine (ZYRTEC) 10 MG chewable tablet Chew 10 mg by mouth daily.     diphenhydrAMINE (BENADRYL) 25 mg capsule Take 25  mg by mouth every 6 (six) hours as needed.     fluticasone (FLONASE) 50 MCG/ACT nasal spray Place 1 spray into both nostrils 2 (two) times daily. 48 g 3   ibandronate (BONIVA) 150 MG tablet Take 150 mg by mouth every 30 (thirty) days. Take in the morning with a full glass of water, on an empty stomach, and do not take anything else by mouth or lie down for the next 30 min.     montelukast (SINGULAIR) 10 MG tablet Take 1 tablet (10 mg total) by mouth at bedtime. 90 tablet 3   Multiple Vitamin (MULTIVITAMIN) tablet Take 1 tablet by mouth daily.     rizatriptan (MAXALT) 10 MG tablet TAKE 1 TABLET BY MOUTH AS NEEDED FOR MIGRAINE HEADACHE. MAY REPEAT IN 2 HOURS IF NEEDED. 10 tablet 0   Current Facility-Administered Medications  Medication Dose Route Frequency Provider Last Rate Last  Admin   0.9 %  sodium chloride infusion  500 mL Intravenous Once Jackquline Denmark, MD        No Known Allergies  Review of Systems:  neg     Physical Exam:    BP 128/82 (BP Location: Left Arm, Patient Position: Sitting, Cuff Size: Normal)   Pulse 67   Ht 5\' 3"  (1.6 m)   Wt 139 lb (63 kg)   LMP 04/09/2012   SpO2 (!) 67%   BMI 24.62 kg/m  Wt Readings from Last 3 Encounters:  09/07/21 139 lb (63 kg)  08/11/21 136 lb (61.7 kg)  08/10/21 135 lb (61.2 kg)   Constitutional:  Well-developed, in no acute distress. Psychiatric: Normal mood and affect. Behavior is normal. HEENT: Pupils normal.  Conjunctivae are normal. No scleral icterus. Abdominal: Soft, nondistended. Nontender. Bowel sounds active throughout. There are no masses palpable. No hepatomegaly. Rectal: Deferred Neurological: Alert and oriented to person place and time. Skin: Skin is warm and dry. No rashes noted.  Data Reviewed: I have personally reviewed following labs and imaging studies  CBC: CBC Latest Ref Rng & Units 09/05/2021 08/11/2021 05/24/2021  WBC 4.0 - 10.5 K/uL 5.4 4.4 5.9  Hemoglobin 12.0 - 15.0 g/dL 11.9(L) 11.4(L) 13.7  Hematocrit 36.0 - 46.0 % 36.8 35.2(L) 41.8  Platelets 150.0 - 400.0 K/uL 242.0 187 249    CMP: CMP Latest Ref Rng & Units 08/11/2021 05/24/2021 10/13/2020  Glucose 70 - 99 mg/dL 95 71 76  BUN 6 - 20 mg/dL 14 17 22   Creatinine 0.44 - 1.00 mg/dL 0.84 0.85 0.90  Sodium 135 - 145 mmol/L 141 139 141  Potassium 3.5 - 5.1 mmol/L 3.4(L) 4.4 4.2  Chloride 98 - 111 mmol/L 109 101 103  CO2 22 - 32 mmol/L 25 23 26   Calcium 8.9 - 10.3 mg/dL 8.1(L) 9.7 9.8  Total Protein 6.0 - 8.5 g/dL - 7.1 7.2  Total Bilirubin 0.0 - 1.2 mg/dL - 0.4 0.3  Alkaline Phos 44 - 121 IU/L - 74 72  AST 0 - 40 IU/L - 25 33  ALT 0 - 32 IU/L - 21 42(H)       Carmell Austria, MD 09/07/2021, 3:49 PM  Cc: Camillia Herter, NP

## 2021-09-20 ENCOUNTER — Other Ambulatory Visit: Payer: Self-pay | Admitting: Family

## 2021-09-20 DIAGNOSIS — G43009 Migraine without aura, not intractable, without status migrainosus: Secondary | ICD-10-CM

## 2021-09-26 ENCOUNTER — Encounter: Payer: Self-pay | Admitting: Physician Assistant

## 2021-09-26 ENCOUNTER — Ambulatory Visit (INDEPENDENT_AMBULATORY_CARE_PROVIDER_SITE_OTHER): Payer: Managed Care, Other (non HMO) | Admitting: Physician Assistant

## 2021-09-26 ENCOUNTER — Other Ambulatory Visit: Payer: Self-pay

## 2021-09-26 DIAGNOSIS — Z1283 Encounter for screening for malignant neoplasm of skin: Secondary | ICD-10-CM

## 2021-09-26 DIAGNOSIS — B379 Candidiasis, unspecified: Secondary | ICD-10-CM

## 2021-09-26 DIAGNOSIS — C44519 Basal cell carcinoma of skin of other part of trunk: Secondary | ICD-10-CM

## 2021-09-26 DIAGNOSIS — D485 Neoplasm of uncertain behavior of skin: Secondary | ICD-10-CM

## 2021-09-26 DIAGNOSIS — B372 Candidiasis of skin and nail: Secondary | ICD-10-CM

## 2021-09-26 DIAGNOSIS — D0439 Carcinoma in situ of skin of other parts of face: Secondary | ICD-10-CM

## 2021-09-26 MED ORDER — NONFORMULARY OR COMPOUNDED ITEM: 1 refills | Status: DC

## 2021-09-26 NOTE — Patient Instructions (Addendum)
Biopsy, Surgery (Curettage) & Surgery (Excision) Aftercare Instructions  1. Okay to remove bandage in 24 hours  2. Wash area with soap and water  3. Apply Vaseline to area twice daily until healed (Not Neosporin)  4. Okay to cover with a Band-Aid to decrease the chance of infection or prevent irritation from clothing; also it's okay to uncover lesion at home.  5. Suture instructions: return to our office in 7-10 or 10-14 days for a nurse visit for suture removal. Variable healing with sutures, if pain or itching occurs call our office. It's okay to shower or bathe 24 hours after sutures are given.  6. The following risks may occur after a biopsy, curettage or excision: bleeding, scarring, discoloration, recurrence, infection (redness, yellow drainage, pain or swelling).  7. For questions, concerns and results call our office at [place] before 4pm & Friday before 3pm. Biopsy results will be available in 1 week.    Take prescription to [facility] on [address] Use nightly and take polish off the next day.

## 2021-09-26 NOTE — Progress Notes (Signed)
New Patient   Subjective  Tamara Lutz is a 60 y.o. female who presents for the following: New Patient (Initial Visit) (Patient here today for skin check, no new concerns. No personal history or family history of atypical moles, melanoma or non mole skin cancer. ). Her toenails are a white color,dry and thick. A culture was performed 5 years ago that was negative for tinea.   The following portions of the chart were reviewed this encounter and updated as appropriate:  Tobacco   Allergies   Meds   Problems   Med Hx   Surg Hx   Fam Hx       Objective  Well appearing patient in no apparent distress; mood and affect are within normal limits.  A full examination was performed including scalp, head, eyes, ears, nose, lips, neck, chest, axillae, abdomen, back, buttocks, bilateral upper extremities, bilateral lower extremities, hands, feet, fingers, toes, fingernails, and toenails. All findings within normal limits unless otherwise noted below.  Right 2nd Proximal Nail fold of Toe, Right 3rd Proximal Nail fold of Toe, Right 4th Proximal Nail fold of Toe, Right 5th Toe Nail Plate, Right Hallux Toe Nail Plate White discoloration with thickening.   Right Chin Bichromic macule       Upper Right Chest Pink papule        Assessment & Plan  Candidiasis (5) Right Hallux Toe Nail Plate; Right 2nd Proximal Nail fold of Toe; Right 3rd Proximal Nail fold of Toe; Right 4th Proximal Nail fold of Toe; Right 5th Toe Nail Plate  Diflucan in DMSO compound nightly.   NONFORMULARY OR COMPOUNDED ITEM - Right 2nd Proximal Nail fold of Toe, Right 3rd Proximal Nail fold of Toe, Right 4th Proximal Nail fold of Toe, Right 5th Toe Nail Plate, Right Hallux Toe Nail Plate 1 Fluconazole 478 mg crush tabs in 30 ml DMSO # 2 oz One to two gtts under each nail  Neoplasm of uncertain behavior of skin (2) Right Chin  Skin / nail biopsy Type of biopsy: tangential   Informed consent: discussed and  consent obtained   Timeout: patient name, date of birth, surgical site, and procedure verified   Procedure prep:  Patient was prepped and draped in usual sterile fashion (Non sterile) Prep type:  Chlorhexidine Anesthesia: the lesion was anesthetized in a standard fashion   Anesthetic:  1% lidocaine w/ epinephrine 1-100,000 local infiltration Instrument used: flexible razor blade   Outcome: patient tolerated procedure well   Post-procedure details: sterile dressing applied and wound care instructions given   Dressing type: bandage and petrolatum    Specimen 1 - Surgical pathology Differential Diagnosis: R/O Atypia  Check Margins: Yes  Upper Right Chest  Skin / nail biopsy Type of biopsy: tangential   Informed consent: discussed and consent obtained   Timeout: patient name, date of birth, surgical site, and procedure verified   Procedure prep:  Patient was prepped and draped in usual sterile fashion (Non sterile) Prep type:  Chlorhexidine Anesthesia: the lesion was anesthetized in a standard fashion   Anesthetic:  1% lidocaine w/ epinephrine 1-100,000 local infiltration Instrument used: flexible razor blade   Hemostasis achieved with: aluminum chloride   Outcome: patient tolerated procedure well   Post-procedure details: sterile dressing applied and wound care instructions given   Dressing type: bandage    Specimen 2 - Surgical pathology Differential Diagnosis: R/O Atypia  Check Margins: YES     I, Maiana Hennigan, PA-C, have reviewed all documentation's for this visit.  The documentation on 09/26/21 for the exam, diagnosis, procedures and orders are all accurate and complete.

## 2021-09-27 ENCOUNTER — Other Ambulatory Visit: Payer: Self-pay | Admitting: Family

## 2021-09-27 DIAGNOSIS — G43009 Migraine without aura, not intractable, without status migrainosus: Secondary | ICD-10-CM

## 2021-10-11 ENCOUNTER — Telehealth: Payer: Self-pay | Admitting: *Deleted

## 2021-10-11 NOTE — Telephone Encounter (Signed)
Tamara Lutz called patient to give pathology results. Verbal order given by [name] to refer patient to Dr Anne Fu for treatment. Referral done via epic. I also spoke with patient to let her know to call us back once Melanoma surgery/treatment scheduled so that we can make her a surgery appointment for [name] to treat basal cell carcinoma as well. Patient understood to call us back if she hasn't heard anything from Dr Anne Fu office in a week.

## 2021-10-18 ENCOUNTER — Telehealth: Payer: Self-pay | Admitting: Physician Assistant

## 2021-10-18 NOTE — Telephone Encounter (Signed)
Phone call from patient returning my call. Patient given Dr. Leroy Sea Merritt's number [phone].

## 2021-10-18 NOTE — Telephone Encounter (Signed)
Patient left message on office voice mail that she still has not heard from Dr. Leroy Sea Merritt's office about setting up a surgical appointment and so wants to know what she should do.

## 2021-10-18 NOTE — Telephone Encounter (Signed)
Says we were referring her to Dr Manley Mason in [facility] for a melanoma but she hasn't heard from his office yet about setting an appointment, + we told her to call us back if she hasn't heard by now.

## 2021-10-18 NOTE — Telephone Encounter (Signed)
Phone call to patient to give her Dr. Leroy Sea Merritt's phone number. Voicemail left for patient to give the office a call back.

## 2021-10-19 ENCOUNTER — Encounter: Payer: Self-pay | Admitting: Family

## 2021-10-19 ENCOUNTER — Other Ambulatory Visit: Payer: Self-pay

## 2021-10-19 DIAGNOSIS — J309 Allergic rhinitis, unspecified: Secondary | ICD-10-CM

## 2021-10-19 MED ORDER — MONTELUKAST SODIUM 10 MG PO TABS: 10.0000 mg | ORAL_TABLET | Freq: Every day | ORAL | 1 refills | Status: DC

## 2021-10-21 ENCOUNTER — Encounter: Payer: Self-pay | Admitting: Physician Assistant

## 2021-11-07 ENCOUNTER — Other Ambulatory Visit: Payer: Self-pay | Admitting: Family

## 2021-11-07 DIAGNOSIS — G43009 Migraine without aura, not intractable, without status migrainosus: Secondary | ICD-10-CM

## 2021-12-18 ENCOUNTER — Other Ambulatory Visit: Payer: Self-pay | Admitting: Family

## 2021-12-18 DIAGNOSIS — G43009 Migraine without aura, not intractable, without status migrainosus: Secondary | ICD-10-CM

## 2022-01-23 ENCOUNTER — Encounter: Payer: Self-pay | Admitting: Physician Assistant

## 2022-01-23 ENCOUNTER — Ambulatory Visit (INDEPENDENT_AMBULATORY_CARE_PROVIDER_SITE_OTHER): Payer: Managed Care, Other (non HMO) | Admitting: Physician Assistant

## 2022-01-23 DIAGNOSIS — C44519 Basal cell carcinoma of skin of other part of trunk: Secondary | ICD-10-CM

## 2022-01-23 NOTE — Patient Instructions (Signed)

## 2022-01-23 NOTE — Progress Notes (Signed)
? ?  Follow-Up Visit ?  ?Subjective  ?Tamara Lutz is a 61 y.o. female who presents for the following: Procedure (Patient here today for treatment of BCC x 1 right upper chest). She had moh's surgery on her MM on her chin in [place]. She is pleased with the scar. Healing well.  ? ? ?The following portions of the chart were reviewed this encounter and updated as appropriate:  Tobacco  Allergies  Meds  Problems  Med Hx  Surg Hx  Fam Hx   ?  ? ?Objective  ?Well appearing patient in no apparent distress; mood and affect are within normal limits. ? ?A focused examination was performed including face. Relevant physical exam findings are noted in the Assessment and Plan. ? ?Upper right chest ?Thin, white plaque ? ? ?Assessment & Plan  ?Basal cell carcinoma of trunk ?Upper right chest ? ?Destruction of lesion ?Complexity: simple   ?Destruction method: electrodesiccation and curettage   ?Informed consent: discussed and consent obtained   ?Timeout:  patient name, date of birth, surgical site, and procedure verified ?Anesthesia: the lesion was anesthetized in a standard fashion   ?Anesthetic:  1% lidocaine w/ epinephrine 1-100,000 local infiltration ?Curettage performed in three different directions: Yes   ?Electrodesiccation performed over the curetted area: Yes   ?Curettage cycles:  3 ?Final wound size (cm):  1.7 ?Hemostasis achieved with:  ferric subsulfate ?Outcome: patient tolerated procedure well with no complications   ?Additional details:  Wound innoculated with 5 fluorouracil solution. ? ? ? ?I, Kiyani Jernigan, PA-C, have reviewed all documentation's for this visit.  The documentation on 01/23/22 for the exam, diagnosis, procedures and orders are all accurate and complete. ?

## 2022-02-06 ENCOUNTER — Other Ambulatory Visit: Payer: Self-pay | Admitting: Family

## 2022-02-06 DIAGNOSIS — G43009 Migraine without aura, not intractable, without status migrainosus: Secondary | ICD-10-CM

## 2022-02-07 NOTE — Telephone Encounter (Signed)
Requested Prescriptions  ?Pending Prescriptions Disp Refills  ?? rizatriptan (MAXALT) 10 MG tablet [Pharmacy Med Name: Rizatriptan Benzoate 10 MG Oral Tablet] 10 tablet 0  ?  Sig: TAKE 1 TABLET BY MOUTH AS NEEDED FOR MIGRAINE HEADACHE. MAY REPEAT IN 2 HOURS IF NEEDED  ?  ? Neurology:  Migraine Therapy - Triptan Passed - 02/06/2022  2:48 PM  ?  ?  Passed - Last BP in normal range  ?  BP Readings from Last 1 Encounters:  ?09/07/21 128/82  ?   ?  ?  Passed - Valid encounter within last 12 months  ?  Recent Outpatient Visits   ?      ? 8 months ago Annual physical exam  ? Primary Care at [facility], [place] J, NP  ? 10 months ago Encounter to establish care  ? Primary Care at [facility], Amy J, NP  ? 1 year ago Hematuria, unspecified type  ? Primary Care at [facility], Laurita Quint, FNP  ? 1 year ago Hematuria, unspecified type  ? Primary Care at [facility], Laurita Quint, FNP  ? 1 year ago Need for immunization against influenza  ? Primary Care at [facility], Laurita Quint, FNP  ?  ?  ?Future Appointments   ?        ? In 5 months [place], Ronalee Red, [place] [facility], [facility]  ?  ? ?  ?  ?  ? ? ?

## 2022-02-21 ENCOUNTER — Encounter: Payer: Self-pay | Admitting: Family

## 2022-02-22 NOTE — Telephone Encounter (Signed)
Spoke to pt states that she feels a little better and declined any appointments at this time.

## 2022-03-12 NOTE — Progress Notes (Signed)
Patient ID: Tamara Lutz, female    DOB: 29-Nov-1960  MRN: [id number]  CC: Hyperlipidemia   Subjective: Tamara Lutz is a 61 y.o. female who presents for hyperlipidemia.   Her concerns today include:  - Doing well on current Atorvastatin. Recheck fasting cholesterol today.  - Elects CT calcium scoring as preventive measure.  - Doing well on Maxalt, no issues/concerns. Usually taking when increased level of stress. - Reports felt she had a UTI several weeks ago. Took AZO at that time. Wants to check to make sure ok.  - Reports history of IBS years ago. At that time keeping a food journal. Reports typically caffeine and fried foods causes flares. Reports several weeks ago experience constipation, lower abdominal pain, tenderness, and nausea. She is not taking any medications over-the-counter. Reports she did have some of the foods/beverages which causes flares around the time she wasn't feeling well. She is not ready for referral to Gastroenterology as of present. Prefers natural measures as of present.  Reports she is up to date on colon cancer screening. Denies any blood in stool/darker than normal stool.  Patient Active Problem List   Diagnosis Date Noted   Rectal bleeding    H/O colonoscopy with polypectomy    Insomnia 05/24/2021   Osteoporosis 06/02/2019   Migraine 06/02/2019   Hyperlipidemia 06/02/2019     Current Outpatient Medications on File Prior to Visit  Medication Sig Dispense Refill   atorvastatin (LIPITOR) 20 MG tablet TAKE 1 TABLET BY MOUTH ONCE DAILY AT 6PM 90 tablet 3   Calcium Carb-Cholecalciferol 600-400 MG-UNIT CAPS Take 1 capsule by mouth.     cetirizine (ZYRTEC) 10 MG chewable tablet Chew 10 mg by mouth daily.     diphenhydrAMINE (BENADRYL) 25 mg capsule Take 25 mg by mouth every 6 (six) hours as needed.     fluticasone (FLONASE) 50 MCG/ACT nasal spray Place 1 spray into both nostrils 2 (two) times daily. 48 g 3   ibandronate (BONIVA) 150 MG tablet  Take 150 mg by mouth every 30 (thirty) days. Take in the morning with a full glass of water, on an empty stomach, and do not take anything else by mouth or lie down for the next 30 min.     montelukast (SINGULAIR) 10 MG tablet Take 1 tablet (10 mg total) by mouth at bedtime. 90 tablet 1   Multiple Vitamin (MULTIVITAMIN) tablet Take 1 tablet by mouth daily.     NONFORMULARY OR COMPOUNDED ITEM 1 Fluconazole 200 mg crush tabs in 30 ml DMSO # 2 oz One to two gtts under each nail 2 each 1   Current Facility-Administered Medications on File Prior to Visit  Medication Dose Route Frequency Provider Last Rate Last Admin   0.9 %  sodium chloride infusion  500 mL Intravenous Once Jackquline Denmark, MD        No Known Allergies  Social History   Socioeconomic History   Marital status: Married    Spouse name: Not on file   Number of children: 5   Years of education: 16   Highest education level: Not on file  Occupational History   Not on file  Tobacco Use   Smoking status: Never    Passive exposure: Never   Smokeless tobacco: Never  Vaping Use   Vaping Use: Never used  Substance and Sexual Activity   Alcohol use: Yes    Comment: occasionally-2 per month   Drug use: No   Sexual activity: Yes  Other  Topics Concern   Not on file  Social History Narrative   Right handed   One story home   Drinks caffeine    Social Determinants of Health   Financial Resource Strain: Not on file  Food Insecurity: Not on file  Transportation Needs: Not on file  Physical Activity: Not on file  Stress: Not on file  Social Connections: Not on file  Intimate Partner Violence: Not on file    Family History  Problem Relation Age of Onset   Multiple sclerosis Mother    Uterine cancer Mother    Alzheimer's disease Father    Breast cancer Sister 77   Diabetes Neg Hx    Heart disease Neg Hx    Hypertension Neg Hx    Colon cancer Neg Hx    Esophageal cancer Neg Hx    Rectal cancer Neg Hx    Stomach  cancer Neg Hx     Past Surgical History:  Procedure Laterality Date   CESAREAN SECTION     CESAREAN SECTION N/A    Phreesia 07/31/2020   COLONOSCOPY  2019   Dr.Gupta   DILATION AND CURETTAGE OF UTERUS     FLEXIBLE SIGMOIDOSCOPY N/A 08/11/2021   Procedure: FLEXIBLE SIGMOIDOSCOPY;  Surgeon: Carol Ada, MD;  Location: [facility];  Service: Endoscopy;  Laterality: N/A;   HEMOSTASIS CLIP PLACEMENT  08/11/2021   Procedure: HEMOSTASIS CLIP PLACEMENT;  Surgeon: Carol Ada, MD;  Location: [facility];  Service: Endoscopy;;   MOUTH SURGERY     POLYPECTOMY     SCLEROTHERAPY  08/11/2021   Procedure: Clide Deutscher;  Surgeon: Carol Ada, MD;  Location: [facility];  Service: Endoscopy;;    ROS: Review of Systems Negative except as stated above  PHYSICAL EXAM: BP 120/85 (BP Location: Left Arm, Patient Position: Sitting, Cuff Size: Normal)   Pulse 69   Temp 98.3 F (36.8 C)   Resp 18   Ht 5' 2.99" (1.6 m)   Wt 136 lb (61.7 kg)   LMP 04/09/2012   SpO2 95%   BMI 24.10 kg/m   Physical Exam HENT:     Head: Normocephalic and atraumatic.  Eyes:     Extraocular Movements: Extraocular movements intact.     Conjunctiva/sclera: Conjunctivae normal.     Pupils: Pupils are equal, round, and reactive to light.  Cardiovascular:     Rate and Rhythm: Normal rate and regular rhythm.     Pulses: Normal pulses.     Heart sounds: Normal heart sounds.  Pulmonary:     Effort: Pulmonary effort is normal.     Breath sounds: Normal breath sounds.  Musculoskeletal:     Cervical back: Normal range of motion and neck supple.  Neurological:     General: No focal deficit present.     Mental Status: She is alert and oriented to person, place, and time.  Psychiatric:        Mood and Affect: Mood normal.        Behavior: Behavior normal.    Results for orders placed or performed in visit on 03/19/22  POCT URINALYSIS DIP (CLINITEK)  Result Value Ref Range   Color, UA yellow yellow    Clarity, UA clear clear   Glucose, UA negative negative mg/dL   Bilirubin, UA negative negative   Ketones, POC UA negative negative mg/dL   Spec Grav, UA >=1.030 (A) 1.010 - 1.025   Blood, UA negative negative   pH, UA 6.0 5.0 - 8.0   POC PROTEIN,UA negative negative,  trace   Urobilinogen, UA 0.2 0.2 or 1.0 E.U./dL   Nitrite, UA Negative Negative   Leukocytes, UA Small (1+) (A) Negative    ASSESSMENT AND PLAN: 1. Hyperlipidemia, unspecified hyperlipidemia type - Update lipid panel. Will update Atorvastatin regimen once lipid panel results. - CT Cardiac Scoring per patient request.  - Lipid panel - CT CARDIAC SCORING (SELF PAY ONLY); Future  2. Migraine without aura and without status migrainosus, not intractable - Continue Rizatriptan as prescribed.  - Follow-up with primary provider as scheduled.  - rizatriptan (MAXALT) 10 MG tablet; Take 10 mg (1 tablet total) by mouth at the start of the headache. May repeat in 2 hours x 1 if headache persists. Max of 2 tablets/24 hours.  Dispense: 30 tablet; Refill: 1  3. Frequent urination - No urinary tract infection. Small leukocytes. Further screening cervicovaginal self-swab to screen for chlamydia, gonorrhea, trichomonas, bacterial vaginitis, and candida vaginitis. - POCT URINALYSIS DIP (CLINITEK) - Cervicovaginal ancillary only  4. History of IBS - Patient declined pharmacological management as of present.  - Patient declined referral to Gastroenterology as of present. - May consider trying over-the-counter stool softener and / or acid flux medication.  - Follow-up with primary provider as scheduled.  Patient was given the opportunity to ask questions.  Patient verbalized understanding of the plan and was able to repeat key elements of the plan. Patient was given clear instructions to go to Emergency Department or return to medical center if symptoms don't improve, worsen, or new problems develop.The patient verbalized  understanding.   Orders Placed This Encounter  Procedures   CT CARDIAC SCORING (SELF PAY ONLY)   Lipid panel   POCT URINALYSIS DIP (CLINITEK)     Requested Prescriptions   Signed Prescriptions Disp Refills   rizatriptan (MAXALT) 10 MG tablet 30 tablet 1    Sig: Take 10 mg (1 tablet total) by mouth at the start of the headache. May repeat in 2 hours x 1 if headache persists. Max of 2 tablets/24 hours.    Follow-up with primary provider as scheduled.   Camillia Herter, NP

## 2022-03-19 ENCOUNTER — Ambulatory Visit (INDEPENDENT_AMBULATORY_CARE_PROVIDER_SITE_OTHER): Payer: Managed Care, Other (non HMO) | Admitting: Family

## 2022-03-19 ENCOUNTER — Encounter: Payer: Self-pay | Admitting: Family

## 2022-03-19 ENCOUNTER — Other Ambulatory Visit (HOSPITAL_COMMUNITY)
Admission: RE | Admit: 2022-03-19 | Discharge: 2022-03-19 | Disposition: A | Discharge status: Home / Self Care | Payer: Managed Care, Other (non HMO) | Source: Ambulatory Visit | Attending: Family | Admitting: Family

## 2022-03-19 VITALS — BP 120/85 | HR 69 | Temp 98.3°F | Resp 18 | Ht 62.99 in | Wt 136.0 lb

## 2022-03-19 DIAGNOSIS — R350 Frequency of micturition: Secondary | ICD-10-CM | POA: Diagnosis present

## 2022-03-19 DIAGNOSIS — E785 Hyperlipidemia, unspecified: Secondary | ICD-10-CM

## 2022-03-19 DIAGNOSIS — G43009 Migraine without aura, not intractable, without status migrainosus: Secondary | ICD-10-CM

## 2022-03-19 DIAGNOSIS — Z8719 Personal history of other diseases of the digestive system: Secondary | ICD-10-CM | POA: Diagnosis not present

## 2022-03-19 DIAGNOSIS — Z113 Encounter for screening for infections with a predominantly sexual mode of transmission: Secondary | ICD-10-CM | POA: Insufficient documentation

## 2022-03-19 LAB — POCT URINALYSIS DIP (CLINITEK)
Bilirubin, UA: NEGATIVE
Blood, UA: NEGATIVE
Glucose, UA: NEGATIVE mg/dL
Ketones, POC UA: NEGATIVE mg/dL
Nitrite, UA: NEGATIVE
POC PROTEIN,UA: NEGATIVE
Spec Grav, UA: >=1.03 — AB (ref 1.010–1.025)
Urobilinogen, UA: 0.2 E.U./dL
pH, UA: 6 (ref 5.0–8.0)

## 2022-03-19 MED ORDER — RIZATRIPTAN BENZOATE 10 MG PO TABS: ORAL_TABLET | ORAL | 1 refills | Status: DC

## 2022-03-19 NOTE — Progress Notes (Signed)
Pt presents for hyperlipidemia f/u  Needs refills on Maxalt  Pt states that she felt like she had UTI a couple weeks ago took some AZO wants to make sure cleared up

## 2022-03-20 LAB — LIPID PANEL
Chol/HDL Ratio: 3.6 ratio (ref 0.0–4.4)
Cholesterol, Total: 181 mg/dL (ref 100–199)
HDL: 50 mg/dL (ref >39)
LDL Chol Calc (NIH): 117 mg/dL — ABNORMAL HIGH (ref 0–99)
Triglycerides: 76 mg/dL (ref 0–149)
VLDL Cholesterol Cal: 14 mg/dL (ref 5–40)

## 2022-03-20 LAB — CERVICOVAGINAL ANCILLARY ONLY
Bacterial Vaginitis (gardnerella): NEGATIVE
Candida Glabrata: NEGATIVE
Candida Vaginitis: NEGATIVE
Chlamydia: NEGATIVE
Comment: NEGATIVE
Comment: NEGATIVE
Comment: NEGATIVE
Comment: NEGATIVE
Comment: NEGATIVE
Comment: NORMAL
Neisseria Gonorrhea: NEGATIVE
Trichomonas: NEGATIVE

## 2022-04-08 ENCOUNTER — Other Ambulatory Visit: Payer: Self-pay | Admitting: Family

## 2022-04-08 DIAGNOSIS — J309 Allergic rhinitis, unspecified: Secondary | ICD-10-CM

## 2022-04-09 NOTE — Telephone Encounter (Signed)
Requested Prescriptions  Pending Prescriptions Disp Refills  . montelukast (SINGULAIR) 10 MG tablet [Pharmacy Med Name: Montelukast Sodium 10 MG Oral Tablet] 90 tablet 1    Sig: TAKE 1 TABLET BY MOUTH AT BEDTIME     Pulmonology:  Leukotriene Inhibitors Passed - 04/08/2022 12:50 PM      Passed - Valid encounter within last 12 months    Recent Outpatient Visits          3 weeks ago Hyperlipidemia, unspecified hyperlipidemia type   Primary Care at [facility], [place], NP   10 months ago Annual physical exam   Primary Care at [facility], [place], NP   1 year ago Encounter to establish care   Primary Care at [facility], Amy J, NP   1 year ago Hematuria, unspecified type   Primary Care at [place], Laurita Quint, FNP   1 year ago Hematuria, unspecified type   Primary Care at [place], Laurita Quint, FNP      Future Appointments            In 2 months Camillia Herter, NP Primary Care at [facility]   In 3 months [place], Ronalee Red, PA-C [facility], [facility]

## 2022-05-03 ENCOUNTER — Encounter: Payer: Self-pay | Admitting: Family

## 2022-05-03 ENCOUNTER — Ambulatory Visit (HOSPITAL_BASED_OUTPATIENT_CLINIC_OR_DEPARTMENT_OTHER)
Admission: RE | Admit: 2022-05-03 | Discharge: 2022-05-03 | Disposition: A | Discharge status: Home / Self Care | Payer: Managed Care, Other (non HMO) | Source: Ambulatory Visit | Attending: Family | Admitting: Family

## 2022-05-03 ENCOUNTER — Other Ambulatory Visit: Payer: Self-pay | Admitting: Family

## 2022-05-03 DIAGNOSIS — E785 Hyperlipidemia, unspecified: Secondary | ICD-10-CM | POA: Insufficient documentation

## 2022-05-03 DIAGNOSIS — I77810 Thoracic aortic ectasia: Secondary | ICD-10-CM

## 2022-05-15 ENCOUNTER — Encounter: Payer: Self-pay | Admitting: Physician Assistant

## 2022-05-15 ENCOUNTER — Encounter: Payer: Self-pay | Admitting: Family

## 2022-05-16 ENCOUNTER — Other Ambulatory Visit: Payer: Self-pay | Admitting: Family

## 2022-05-16 DIAGNOSIS — Z86006 Personal history of melanoma in-situ: Secondary | ICD-10-CM

## 2022-05-16 DIAGNOSIS — Z85828 Personal history of other malignant neoplasm of skin: Secondary | ICD-10-CM

## 2022-05-22 NOTE — Progress Notes (Signed)
Cardiology Office Note:    Date:  05/25/2022   ID:  Tamara Lutz, DOB 11/01/1960, MRN [id number]  PCP:  Tamara Herter, NP   [facility] Providers Cardiologist:  Tamara Sciara, MD Referring MD: Tamara Herter, NP   Chief Complaint/Reason for Referral: Aortic root dilatation  ASSESSMENT:    1. Aortic dilatation (HCC)   2. Hyperlipidemia, unspecified hyperlipidemia type     PLAN:    In order of problems listed above: 1.  Aortic dilatation: This is only mildly dilated on the CT scan done recently.  We will obtain a CTA scan for aortic aneurysm in 1 year.  Follow-up in 1 year. 2.  Hyperlipidemia: Patient has calcium score of 0 so an LDL of less than 70 is not necessarily required.  She seems to have no other significant cardiovascular risk factors.  This is being followed by the patient's primary care provider.       Dispo:  Return in about 1 year (around 05/26/2023).      Medication Adjustments/Labs and Tests Ordered: Current medicines are reviewed at length with the patient today.  Concerns regarding medicines are outlined above.  The following changes have been made:  no change   Labs/tests ordered: Orders Placed This Encounter  Procedures   CT ANGIO CHEST AORTA W/CM & OR WO/CM   EKG 12-Lead    Medication Changes: No orders of the defined types were placed in this encounter.    Current medicines are reviewed at length with the patient today.  The patient does not have concerns regarding medicines.   History of Present Illness:    FOCUSED PROBLEM LIST:   1.  Calcium score of 0 on CT 2023 2.  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  Hyperlipidemia  The patient is a 61 y.o. female with the indicated medical history here for recommendations regarding incidentally noted aortic dilatation.  The patient was referred for a calcium score CT which demonstrated very reassuring findings as detailed above.  She was incidentally noted to have mild aortic  dilatation of 40 mm.  She is referred for further recommendations.  The patient does not smoke.  She does not have a family history of aneurysms.  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  She denies any regular exertional angina.  She denies any routine exertional dyspnea.  She says she did get fairly winded [place] the [place] recently about.  She has required no emergency room visits or hospitalizations.  She is otherwise well without significant complaints.          Current Medications: Current Meds  Medication Sig   Calcium Carb-Cholecalciferol 600-400 MG-UNIT CAPS Take 1 capsule by mouth.   cetirizine (ZYRTEC) 10 MG chewable tablet Chew 10 mg by mouth daily.   diphenhydrAMINE (BENADRYL) 25 mg capsule Take 25 mg by mouth every 6 (six) hours as needed.   fluticasone (FLONASE) 50 MCG/ACT nasal spray Place 1 spray into both nostrils 2 (two) times daily.   ibandronate (BONIVA) 150 MG tablet Take 150 mg by mouth every 30 (thirty) days. Take in the morning with a full glass of water, on an empty stomach, and do not take anything else by mouth or lie down for the next 30 min.   montelukast (SINGULAIR) 10 MG tablet TAKE 1 TABLET BY MOUTH AT BEDTIME   Multiple Vitamin (MULTIVITAMIN) tablet Take 1 tablet by mouth daily.   rizatriptan (  MAXALT) 10 MG tablet Take 10 mg (1 tablet total) by mouth at the start of the headache. May repeat in 2 hours x 1 if headache persists. Max of 2 tablets/24 hours.   Current Facility-Administered Medications for the 05/25/22 encounter (Office Visit) with Early Osmond, MD  Medication   0.9 %  sodium chloride infusion     Allergies:    Patient has no known allergies.   Social History:   Social History   Tobacco Use   Smoking status: Never    Passive exposure: Never   Smokeless tobacco: Never  Vaping Use   Vaping Use: Never used  Substance Use Topics   Alcohol use: Yes    Comment: occasionally-2 per month   Drug  use: No     Family Hx: Family History  Problem Relation Age of Onset   Multiple sclerosis Mother    Uterine cancer Mother    Alzheimer's disease Father    Breast cancer Sister 40   Diabetes Neg Hx    Heart disease Neg Hx    Hypertension Neg Hx    Colon cancer Neg Hx    Esophageal cancer Neg Hx    Rectal cancer Neg Hx    Stomach cancer Neg Hx      Review of Systems:   Please see the history of present illness.    All other systems reviewed and are negative.     EKGs/Labs/Other Test Reviewed:    EKG:  EKG performed today that I personally reviewed demonstrates sinus rhythm with nonspecific ST and T wave changes  Prior CV studies:  Calcium score CT 2023 with a calcium score of 0 and mild aortic root dilatation of 40 mm  Other studies Reviewed: Review of the additional studies/records demonstrates: None relevant  Recent Labs: 08/11/2021: BUN 14; Creatinine, Ser 0.84; Potassium 3.4; Sodium 141 09/05/2021: Hemoglobin 11.9; Platelets 242.0   Recent Lipid Panel Lab Results  Component Value Date/Time   CHOL 181 03/19/2022 09:22 AM   TRIG 76 03/19/2022 09:22 AM   HDL 50 03/19/2022 09:22 AM   LDLCALC 117 (H) 03/19/2022 09:22 AM    Risk Assessment/Calculations:           Physical Exam:    VS:  BP 110/78   Pulse 72   Ht '5\' 3"'$  (1.6 m)   Wt 141 lb (64 kg)   LMP 04/09/2012   BMI 24.98 kg/m    Wt Readings from Last 3 Encounters:  05/25/22 141 lb (64 kg)  03/19/22 136 lb (61.7 kg)  09/07/21 139 lb (63 kg)    GENERAL:  No apparent distress, AOx3 HEENT:  No carotid bruits, +2 carotid impulses, no scleral icterus CAR: RRR no murmurs, gallops, rubs, or thrills RES:  Clear to auscultation bilaterally ABD:  Soft, nontender, nondistended, positive bowel sounds x 4 VASC:  +2 radial pulses, +2 carotid pulses, palpable pedal pulses NEURO:  CN 2-12 grossly intact; motor and sensory grossly intact PSYCH:  No active depression or anxiety EXT:  No edema, ecchymosis, or  cyanosis  Signed, Early Osmond, MD  05/25/2022 8:22 AM    [address], [place], [place]  [id number] Phone: [phone]; Fax: [phone]   Note:  This document was prepared using Dragon voice recognition software and may include unintentional dictation errors.

## 2022-05-25 ENCOUNTER — Encounter: Payer: Self-pay | Admitting: Internal Medicine

## 2022-05-25 ENCOUNTER — Ambulatory Visit (INDEPENDENT_AMBULATORY_CARE_PROVIDER_SITE_OTHER): Payer: Managed Care, Other (non HMO) | Admitting: Internal Medicine

## 2022-05-25 VITALS — BP 110/78 | HR 72 | Ht 63.0 in | Wt 141.0 lb

## 2022-05-25 DIAGNOSIS — E785 Hyperlipidemia, unspecified: Secondary | ICD-10-CM | POA: Diagnosis not present

## 2022-05-25 DIAGNOSIS — I77819 Aortic ectasia, unspecified site: Secondary | ICD-10-CM

## 2022-05-25 NOTE — Patient Instructions (Signed)
Medication Instructions:  No changes *If you need a refill on your cardiac medications before your next appointment, please call your pharmacy*   Lab Work: none   Testing/Procedures: Chest CT - Aorta --due in one year (Aug 2024)   Follow-Up: At [facility], you and your health needs are our priority.  As part of our continuing mission to provide you with exceptional heart care, we have created designated Provider Care Teams.  These Care Teams include your primary Cardiologist (physician) and Advanced Practice Providers (APPs -  Physician Assistants and Nurse Practitioners) who all work together to provide you with the care you need, when you need it.  Your next appointment:   12 month(s)  The format for your next appointment:   In Person  Provider:   Early Osmond, MD  Or Advanced Practice Provider (NP or PA    Important Information About Sugar

## 2022-06-10 NOTE — Progress Notes (Signed)
  Patient ID: Tamara Lutz, female    DOB: 08/26/1961  MRN: [id number]  CC: Annual Physical Exam  Subjective: Tamara Lutz is a 61 y.o. female who presents for annual physical exam.   Her concerns today include:  - Established with mammogram provider and reports plans to schedule appointment soon.  - Would like to know next steps on cholesterol medication discontinuation. Reports her cardiologist told her cholesterol medication is not needed to assist with management of aortic dilatation.   Patient Active Problem List   Diagnosis Date Noted   H/O colonoscopy with polypectomy    Insomnia 05/24/2021   Osteoporosis 06/02/2019   Migraine without aura 06/02/2019   Hyperlipidemia 06/02/2019   Irritable bowel syndrome 06/02/2019     Current Outpatient Medications on File Prior to Visit  Medication Sig Dispense Refill   Calcium Carb-Cholecalciferol 600-400 MG-UNIT CAPS Take 1 capsule by mouth.     cetirizine (ZYRTEC) 10 MG chewable tablet Chew 10 mg by mouth daily.     fluticasone (FLONASE) 50 MCG/ACT nasal spray Place 1 spray into both nostrils 2 (two) times daily. 48 g 3   ibandronate (BONIVA) 150 MG tablet Take 150 mg by mouth every 30 (thirty) days. Take in the morning with a full glass of water, on an empty stomach, and do not take anything else by mouth or lie down for the next 30 min.     montelukast (SINGULAIR) 10 MG tablet TAKE 1 TABLET BY MOUTH AT BEDTIME 90 tablet 1   Multiple Vitamin (MULTIVITAMIN) tablet Take 1 tablet by mouth daily.     rizatriptan (MAXALT) 10 MG tablet Take 10 mg (1 tablet total) by mouth at the start of the headache. May repeat in 2 hours x 1 if headache persists. Max of 2 tablets/24 hours. 30 tablet 1   Current Facility-Administered Medications on File Prior to Visit  Medication Dose Route Frequency Provider Last Rate Last Admin   0.9 %  sodium chloride infusion  500 mL Intravenous Once Gupta, Rajesh, MD        No Known Allergies  Social  History   Socioeconomic History   Marital status: Married    Spouse name: Not on file   Number of children: 5   Years of education: 16   Highest education level: Not on file  Occupational History   Not on file  Tobacco Use   Smoking status: Never    Passive exposure: Never   Smokeless tobacco: Never  Vaping Use   Vaping Use: Never used  Substance and Sexual Activity   Alcohol use: Yes    Comment: occasionally-2 per month   Drug use: No   Sexual activity: Yes  Other Topics Concern   Not on file  Social History Narrative   Right handed   One story home   Drinks caffeine    Social Determinants of Health   Financial Resource Strain: Not on file  Food Insecurity: Not on file  Transportation Needs: Not on file  Physical Activity: Not on file  Stress: Not on file  Social Connections: Not on file  Intimate Partner Violence: Not on file    Family History  Problem Relation Age of Onset   Multiple sclerosis Mother    Uterine cancer Mother    Alzheimer's disease Father    Breast cancer Sister 54   Diabetes Neg Hx    Heart disease Neg Hx    Hypertension Neg Hx    Colon cancer Neg Hx      Esophageal cancer Neg Hx    Rectal cancer Neg Hx    Stomach cancer Neg Hx     Past Surgical History:  Procedure Laterality Date   CESAREAN SECTION     CESAREAN SECTION N/A    [employer]reesia 07/31/2020   COLONOSCOPY  2019   Dr.Gupta   DILATION AND CURETTAGE OF UTERUS     FLEXIBLE SIGMOIDOSCOPY N/A 08/11/2021   Procedure: FLEXIBLE SIGMOIDOSCOPY;  Surgeon: Hung, Patrick, MD;  Location: [facility];  Service: Endoscopy;  Laterality: N/A;   HEMOSTASIS CLIP PLACEMENT  08/11/2021   Procedure: HEMOSTASIS CLIP PLACEMENT;  Surgeon: Hung, Patrick, MD;  Location: [facility];  Service: Endoscopy;;   MOUTH SURGERY     POLYPECTOMY     SCLEROTHERAPY  08/11/2021   Procedure: SCLEROTHERAPY;  Surgeon: Hung, Patrick, MD;  Location: [facility];  Service: Endoscopy;;    ROS: Review of  Systems Negative except as stated above  PHYSICAL EXAM: BP 128/87 (BP Location: Left Arm, Patient Position: Sitting, Cuff Size: Normal)   Pulse 60   Temp 98.3 F (36.8 C)   Resp 16   Ht 5' 2.99" (1.6 m)   Wt 138 lb (62.6 kg)   LMP 04/09/2012   SpO2 98%   BMI 24.45 kg/m   Physical Exam HENT:     Head: Normocephalic and atraumatic.     Right Ear: Tympanic membrane, ear canal and external ear normal.     Left Ear: Tympanic membrane, ear canal and external ear normal.     Nose: Nose normal.     Mouth/Throat:     Mouth: Mucous membranes are moist.     Pharynx: Oropharynx is clear.  Eyes:     Extraocular Movements: Extraocular movements intact.     Conjunctiva/sclera: Conjunctivae normal.     Pupils: Pupils are equal, round, and reactive to light.  Cardiovascular:     Rate and Rhythm: Normal rate and regular rhythm.     Pulses: Normal pulses.     Heart sounds: Normal heart sounds.  Pulmonary:     Effort: Pulmonary effort is normal.     Breath sounds: Normal breath sounds.  Chest:     Comments: Patient declined.  Abdominal:     General: Bowel sounds are normal.     Palpations: Abdomen is soft.  Genitourinary:    Comments: Patient declined.  Musculoskeletal:        General: Normal range of motion.     Right shoulder: Normal.     Left shoulder: Normal.     Right upper arm: Normal.     Left upper arm: Normal.     Right elbow: Normal.     Left elbow: Normal.     Right forearm: Normal.     Left forearm: Normal.     Right wrist: Normal.     Left wrist: Normal.     Right hand: Normal.     Left hand: Normal.     Cervical back: Normal, normal range of motion and neck supple.     Thoracic back: Normal.     Lumbar back: Normal.     Right hip: Normal.     Left hip: Normal.     Right upper leg: Normal.     Left upper leg: Normal.     Right knee: Normal.     Left knee: Normal.     Right lower leg: Normal.     Left lower leg: Normal.     Right ankle: Normal.       Left  ankle: Normal.     Right foot: Normal.     Left foot: Normal.  Skin:    General: Skin is warm and dry.     Capillary Refill: Capillary refill takes less than 2 seconds.  Neurological:     General: No focal deficit present.     Mental Status: She is alert and oriented to person, place, and time.  Psychiatric:        Mood and Affect: Mood normal.        Behavior: Behavior normal.    ASSESSMENT AND PLAN: 1. Annual physical exam - Counseled on 150 minutes of exercise per week as tolerated, healthy eating (including decreased daily intake of saturated fats, cholesterol, added sugars, sodium), STI prevention, and routine healthcare maintenance.  2. Screening for metabolic disorder - LMB86+LJQG to check kidney function, liver function, and electrolyte balance.  - CMP14+EGFR  3. Screening, iron deficiency anemia - CBC to screen for anemia. - CBC  4. Diabetes mellitus screening - Hemoglobin A1c to screen for pre-diabetes/diabetes. - Hemoglobin A1c  5. Thyroid disorder screen - TSH to check thyroid function.  - TSH  6. Dyslipidemia - Patient had recent appointment 05/25/2022 with Lenna Sciara, MD at [facility]. Per encounter note the following: Hyperlipidemia: Patient has calcium score of 0 so an LDL of less than 70 is not necessarily required.  She seems to have no other significant cardiovascular risk factors.  This is being followed by the patient's primary care provider. - Today discussed will trial taper of Atorvastatin to three times weekly and have her return in 4 to 6 weeks for fasting lipid panel. She is aware to follow-up with me for any side effects, symptoms or otherwise as needed. Patient verbalized understanding and agreement. - Lipid Panel; Future - atorvastatin (LIPITOR) 20 MG tablet; Take 1 tablet (20 mg total) by mouth 3 (three) times a week. Take at 6 pm.  Dispense: 30 tablet; Refill: 1  7. Encounter for screening mammogram for malignant  neoplasm of breast - Patient reports she is established with mammogram provider and reports plans to schedule appointment soon.    Patient was given the opportunity to ask questions.  Patient verbalized understanding of the plan and was able to repeat key elements of the plan. Patient was given clear instructions to go to Emergency Department or return to medical center if symptoms don't improve, worsen, or new problems develop.The patient verbalized understanding.   Orders Placed This Encounter  Procedures   CBC   TSH   CMP14+EGFR   Hemoglobin A1c   Lipid Panel     Requested Prescriptions   Signed Prescriptions Disp Refills   atorvastatin (LIPITOR) 20 MG tablet 30 tablet 1    Sig: Take 1 tablet (20 mg total) by mouth 3 (three) times a week. Take at 6 pm.    Return in about 1 year (around 06/20/2023) for Physical per patient preference, Follow-Up or next available 4 to 6 weeks lab only.  Camillia Herter, NP

## 2022-06-19 ENCOUNTER — Encounter: Payer: Self-pay | Admitting: Family

## 2022-06-19 ENCOUNTER — Ambulatory Visit (INDEPENDENT_AMBULATORY_CARE_PROVIDER_SITE_OTHER): Payer: Managed Care, Other (non HMO) | Admitting: Family

## 2022-06-19 VITALS — BP 128/87 | HR 60 | Temp 98.3°F | Resp 16 | Ht 62.99 in | Wt 138.0 lb

## 2022-06-19 DIAGNOSIS — Z130 Encounter for screening for diseases of the blood and blood-forming organs and certain disorders involving the immune mechanism: Secondary | ICD-10-CM

## 2022-06-19 DIAGNOSIS — Z1231 Encounter for screening mammogram for malignant neoplasm of breast: Secondary | ICD-10-CM

## 2022-06-19 DIAGNOSIS — Z13228 Encounter for screening for other metabolic disorders: Secondary | ICD-10-CM

## 2022-06-19 DIAGNOSIS — E785 Hyperlipidemia, unspecified: Secondary | ICD-10-CM | POA: Diagnosis not present

## 2022-06-19 DIAGNOSIS — Z0001 Encounter for general adult medical examination with abnormal findings: Secondary | ICD-10-CM | POA: Diagnosis not present

## 2022-06-19 DIAGNOSIS — Z131 Encounter for screening for diabetes mellitus: Secondary | ICD-10-CM

## 2022-06-19 DIAGNOSIS — Z1329 Encounter for screening for other suspected endocrine disorder: Secondary | ICD-10-CM

## 2022-06-19 DIAGNOSIS — Z0000 Encounter for general adult medical examination without abnormal findings: Secondary | ICD-10-CM

## 2022-06-19 MED ORDER — ATORVASTATIN CALCIUM 20 MG PO TABS: 20.0000 mg | ORAL_TABLET | ORAL | 1 refills | Status: DC

## 2022-06-19 NOTE — Progress Notes (Signed)
.  Pt presents for annual physical exam  

## 2022-06-19 NOTE — Patient Instructions (Signed)
Preventive Care 60-60 Years Old, Female Preventive care refers to lifestyle choices and visits with your health care provider that can promote health and wellness. Preventive care visits are also called wellness exams. What can I expect for my preventive care visit? Counseling Your health care provider may ask you questions about your: Medical history, including: Past medical problems. Family medical history. Pregnancy history. Current health, including: Menstrual cycle. Method of birth control. Emotional well-being. Home life and relationship well-being. Sexual activity and sexual health. Lifestyle, including: Alcohol, nicotine or tobacco, and drug use. Access to firearms. Diet, exercise, and sleep habits. Work and work environment. Sunscreen use. Safety issues such as seatbelt and bike helmet use. Physical exam Your health care provider will check your: Height and weight. These may be used to calculate your BMI (body mass index). BMI is a measurement that tells if you are at a healthy weight. Waist circumference. This measures the distance around your waistline. This measurement also tells if you are at a healthy weight and may help predict your risk of certain diseases, such as type 2 diabetes and high blood pressure. Heart rate and blood pressure. Body temperature. Skin for abnormal spots. What immunizations do I need?  Vaccines are usually given at various ages, according to a schedule. Your health care provider will recommend vaccines for you based on your age, medical history, and lifestyle or other factors, such as travel or where you work. What tests do I need? Screening Your health care provider may recommend screening tests for certain conditions. This may include: Lipid and cholesterol levels. Diabetes screening. This is done by checking your blood sugar (glucose) after you have not eaten for a while (fasting). Pelvic exam and Pap test. Hepatitis B test. Hepatitis C  test. HIV (human immunodeficiency virus) test. STI (sexually transmitted infection) testing, if you are at risk. Lung cancer screening. Colorectal cancer screening. Mammogram. Talk with your health care provider about when you should start having regular mammograms. This may depend on whether you have a family history of breast cancer. BRCA-related cancer screening. This may be done if you have a family history of breast, ovarian, tubal, or peritoneal cancers. Bone density scan. This is done to screen for osteoporosis. Talk with your health care provider about your test results, treatment options, and if necessary, the need for more tests. Follow these instructions at home: Eating and drinking  Eat a diet that includes fresh fruits and vegetables, whole grains, lean protein, and low-fat dairy products. Take vitamin and mineral supplements as recommended by your health care provider. Do not drink alcohol if: Your health care provider tells you not to drink. You are pregnant, may be pregnant, or are planning to become pregnant. If you drink alcohol: Limit how much you have to 0-1 drink a day. Know how much alcohol is in your drink. In the [place] one drink equals one 12 oz bottle of beer (355 mL), one 5 oz glass of wine (148 mL), or one 1 oz glass of hard liquor (44 mL). Lifestyle Brush your teeth every morning and night with fluoride toothpaste. Floss one time each day. Exercise for at least 30 minutes 5 or more days each week. Do not use any products that contain nicotine or tobacco. These products include cigarettes, chewing tobacco, and vaping devices, such as e-cigarettes. If you need help quitting, ask your health care provider. Do not use drugs. If you are sexually active, practice safe sex. Use a condom or other form of protection to   prevent STIs. If you do not wish to become pregnant, use a form of birth control. If you plan to become pregnant, see your health care provider for a  prepregnancy visit. Take aspirin only as told by your health care provider. Make sure that you understand how much to take and what form to take. Work with your health care provider to find out whether it is safe and beneficial for you to take aspirin daily. Find healthy ways to manage stress, such as: Meditation, yoga, or listening to music. Journaling. Talking to a trusted person. Spending time with friends and family. Minimize exposure to UV radiation to reduce your risk of skin cancer. Safety Always wear your seat belt while driving or riding in a vehicle. Do not drive: If you have been drinking alcohol. Do not ride with someone who has been drinking. When you are tired or distracted. While texting. If you have been using any mind-altering substances or drugs. Wear a helmet and other protective equipment during sports activities. If you have firearms in your house, make sure you follow all gun safety procedures. Seek help if you have been physically or sexually abused. What's next? Visit your health care provider once a year for an annual wellness visit. Ask your health care provider how often you should have your eyes and teeth checked. Stay up to date on all vaccines. This information is not intended to replace advice given to you by your health care provider. Make sure you discuss any questions you have with your health care provider. Document Revised: 03/22/2021 Document Reviewed: 03/22/2021 Elsevier Patient Education  [place].

## 2022-06-20 LAB — HEMOGLOBIN A1C
Est. average glucose Bld gHb Est-mCnc: 105 mg/dL
Hgb A1c MFr Bld: 5.3 % (ref 4.8–5.6)

## 2022-06-20 LAB — CMP14+EGFR
ALT: 19 IU/L (ref 0–32)
AST: 23 IU/L (ref 0–40)
Albumin/Globulin Ratio: 1.6 (ref 1.2–2.2)
Albumin: 4.3 g/dL (ref 3.8–4.9)
Alkaline Phosphatase: 71 IU/L (ref 44–121)
BUN/Creatinine Ratio: 20 (ref 12–28)
BUN: 16 mg/dL (ref 8–27)
Bilirubin Total: 0.4 mg/dL (ref 0.0–1.2)
CO2: 25 mmol/L (ref 20–29)
Calcium: 9.3 mg/dL (ref 8.7–10.3)
Chloride: 106 mmol/L (ref 96–106)
Creatinine, Ser: 0.81 mg/dL (ref 0.57–1.00)
Globulin, Total: 2.7 g/dL (ref 1.5–4.5)
Glucose: 67 mg/dL — ABNORMAL LOW (ref 70–99)
Potassium: 4.2 mmol/L (ref 3.5–5.2)
Sodium: 143 mmol/L (ref 134–144)
Total Protein: 7 g/dL (ref 6.0–8.5)
eGFR: 83 mL/min/{1.73_m2} (ref >59)

## 2022-06-20 LAB — CBC
Hematocrit: 39.9 % (ref 34.0–46.6)
Hemoglobin: 12.9 g/dL (ref 11.1–15.9)
MCH: 29.8 pg (ref 26.6–33.0)
MCHC: 32.3 g/dL (ref 31.5–35.7)
MCV: 92 fL (ref 79–97)
Platelets: 227 10*3/uL (ref 150–450)
RBC: 4.33 x10E6/uL (ref 3.77–5.28)
RDW: 12.4 % (ref 11.7–15.4)
WBC: 4.1 10*3/uL (ref 3.4–10.8)

## 2022-06-20 LAB — TSH: TSH: 2.88 u[IU]/mL (ref 0.450–4.500)

## 2022-07-25 ENCOUNTER — Ambulatory Visit: Payer: Managed Care, Other (non HMO) | Admitting: Physician Assistant

## 2022-07-31 ENCOUNTER — Other Ambulatory Visit (INDEPENDENT_AMBULATORY_CARE_PROVIDER_SITE_OTHER): Payer: Managed Care, Other (non HMO)

## 2022-07-31 DIAGNOSIS — E785 Hyperlipidemia, unspecified: Secondary | ICD-10-CM

## 2022-07-31 DIAGNOSIS — Z23 Encounter for immunization: Secondary | ICD-10-CM

## 2022-07-31 NOTE — Progress Notes (Signed)
Patent given flu vaccine today in RD. Tolerated it well. Labs drawn also

## 2022-08-01 LAB — LIPID PANEL
Chol/HDL Ratio: 3.7 ratio (ref 0.0–4.4)
Cholesterol, Total: 219 mg/dL — ABNORMAL HIGH (ref 100–199)
HDL: 60 mg/dL (ref >39)
LDL Chol Calc (NIH): 147 mg/dL — ABNORMAL HIGH (ref 0–99)
Triglycerides: 68 mg/dL (ref 0–149)
VLDL Cholesterol Cal: 12 mg/dL (ref 5–40)

## 2022-09-10 LAB — HM MAMMOGRAPHY

## 2022-09-12 LAB — HM PAP SMEAR

## 2022-09-13 ENCOUNTER — Encounter: Payer: Self-pay | Admitting: Family

## 2022-09-26 ENCOUNTER — Ambulatory Visit: Payer: Managed Care, Other (non HMO) | Admitting: Physician Assistant

## 2022-10-03 ENCOUNTER — Ambulatory Visit (INDEPENDENT_AMBULATORY_CARE_PROVIDER_SITE_OTHER): Payer: Managed Care, Other (non HMO) | Admitting: Family

## 2022-10-03 VITALS — BP 138/83 | HR 62 | Temp 98.3°F | Resp 16 | Ht 62.99 in | Wt 140.0 lb

## 2022-10-03 DIAGNOSIS — R051 Acute cough: Secondary | ICD-10-CM | POA: Diagnosis not present

## 2022-10-03 DIAGNOSIS — R0989 Other specified symptoms and signs involving the circulatory and respiratory systems: Secondary | ICD-10-CM | POA: Diagnosis not present

## 2022-10-03 MED ORDER — BENZONATATE 100 MG PO CAPS: 100.0000 mg | ORAL_CAPSULE | Freq: Three times a day (TID) | ORAL | 0 refills | Status: DC | PRN

## 2022-10-03 MED ORDER — PREDNISONE 10 MG PO TABS: ORAL_TABLET | ORAL | 0 refills | Status: AC

## 2022-10-03 NOTE — Progress Notes (Signed)
Pt  presents for flu-like symptoms -cough  -runny nose -no fever  -states symptoms started Friday

## 2022-10-03 NOTE — Progress Notes (Signed)
Patient ID: Ireene Ballowe, female    DOB: [birth year]  MRN: [id number]  CC: Cough  Subjective: Tamara Lutz is a 61 y.o. female who presents for cough.   Her concerns today include:  Cough, runny/stuffy nose, sneezing, and increased mucus began 5 days ago. Initially had sore throat but has progressively improved. Noticed on one occasion she became winded after taking out the trash can. This didn't last long and she contributes somewhat to being more sedentary lately. Denies red flag symptoms. 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 Her husband is also sick with similar symptoms as well. She tried over-the-counter medications with mild relief. She took a home Covid test which resulted negative.   Patient Active Problem List   Diagnosis Date Noted   H/O colonoscopy with polypectomy    Insomnia 05/24/2021   Osteoporosis 06/02/2019   Migraine without aura 06/02/2019   Hyperlipidemia 06/02/2019   Irritable bowel syndrome 06/02/2019     Current Outpatient Medications on File Prior to Visit  Medication Sig Dispense Refill   atorvastatin (LIPITOR) 20 MG tablet Take 1 tablet (20 mg total) by mouth 3 (three) times a week. Take at 6 pm. 30 tablet 1   Calcium Carb-Cholecalciferol 600-400 MG-UNIT CAPS Take 1 capsule by mouth.     cetirizine (ZYRTEC) 10 MG chewable tablet Chew 10 mg by mouth daily.     fluticasone (FLONASE) 50 MCG/ACT nasal spray Place 1 spray into both nostrils 2 (two) times daily. 48 g 3   ibandronate (BONIVA) 150 MG tablet Take 150 mg by mouth every 30 (thirty) days. Take in the morning with a full glass of water, on an empty stomach, and do not take anything else by mouth or lie down for the next 30 min.     montelukast (SINGULAIR) 10 MG tablet TAKE 1 TABLET BY MOUTH AT BEDTIME 90 tablet 1   Multiple Vitamin (MULTIVITAMIN) tablet Take 1 tablet by mouth daily.     rizatriptan (MAXALT) 10 MG tablet Take 10 mg (1 tablet  total) by mouth at the start of the headache. May repeat in 2 hours x 1 if headache persists. Max of 2 tablets/24 hours. 30 tablet 1   Current Facility-Administered Medications on File Prior to Visit  Medication Dose Route Frequency Provider Last Rate Last Admin   0.9 %  sodium chloride infusion  500 mL Intravenous Once Jackquline Denmark, MD        No Known Allergies  Social History   Socioeconomic History   Marital status: Married    Spouse name: Not on file   Number of children: 5   Years of education: 16   Highest education level: Not on file  Occupational History   Not on file  Tobacco Use   Smoking status: Never    Passive exposure: Never   Smokeless tobacco: Never  Vaping Use   Vaping Use: Never used  Substance and Sexual Activity   Alcohol use: Yes    Comment: occasionally-2 per month   Drug use: No   Sexual activity: Yes  Other Topics Concern   Not on file  Social History Narrative   Right handed   One story home   Drinks caffeine    Social Determinants of Health   Financial Resource Strain: Not on file  Food Insecurity: Not on file  Transportation Needs: Not on file  Physical Activity: Not on file  Stress: Not on file  Social Connections: Not on file  Intimate Partner Violence: Not on file    Family History  Problem Relation Age of Onset   Multiple sclerosis Mother    Uterine cancer Mother    Alzheimer's disease Father    Breast cancer Sister 91   Diabetes Neg Hx    Heart disease Neg Hx    Hypertension Neg Hx    Colon cancer Neg Hx    Esophageal cancer Neg Hx    Rectal cancer Neg Hx    Stomach cancer Neg Hx     Past Surgical History:  Procedure Laterality Date   CESAREAN SECTION     CESAREAN SECTION N/A    [employer]reesia 07/31/2020   COLONOSCOPY  2019   Dr.Gupta   DILATION AND CURETTAGE OF UTERUS     FLEXIBLE SIGMOIDOSCOPY N/A 08/11/2021   Procedure: FLEXIBLE SIGMOIDOSCOPY;  Surgeon: Carol Ada, MD;  Location: [facility];  Service: Endoscopy;   Laterality: N/A;   HEMOSTASIS CLIP PLACEMENT  08/11/2021   Procedure: HEMOSTASIS CLIP PLACEMENT;  Surgeon: Carol Ada, MD;  Location: [facility];  Service: Endoscopy;;   MOUTH SURGERY     POLYPECTOMY     SCLEROTHERAPY  08/11/2021   Procedure: Clide Deutscher;  Surgeon: Carol Ada, MD;  Location: [facility];  Service: Endoscopy;;    ROS: Review of Systems Negative except as stated above  PHYSICAL EXAM: BP 138/83 (BP Location: Left Arm, Patient Position: Sitting, Cuff Size: Normal)   Pulse 62   Temp 98.3 F (36.8 C)   Resp 16   Ht 5' 2.99" (1.6 m)   Wt 140 lb (63.5 kg)   LMP 04/09/2012   SpO2 98%   BMI 24.81 kg/m   Physical Exam HENT:     Head: Normocephalic and atraumatic.     Right Ear: Tympanic membrane, ear canal and external ear normal.     Left Ear: Tympanic membrane, ear canal and external ear normal.     Nose: Nose normal.     Mouth/Throat:     Mouth: Mucous membranes are moist.     Pharynx: Oropharynx is clear.  Eyes:     Extraocular Movements: Extraocular movements intact.     Conjunctiva/sclera: Conjunctivae normal.     Pupils: Pupils are equal, round, and reactive to light.  Cardiovascular:     Rate and Rhythm: Normal rate and regular rhythm.     Pulses: Normal pulses.     Heart sounds: Normal heart sounds.  Pulmonary:     Effort: Pulmonary effort is normal.     Breath sounds: Normal breath sounds.  Musculoskeletal:     Cervical back: Normal range of motion and neck supple.  Neurological:     General: No focal deficit present.     Mental Status: She is alert and oriented to person, place, and time.  Psychiatric:        Mood and Affect: Mood normal.        Behavior: Behavior normal.     ASSESSMENT AND PLAN: 1. Upper respiratory symptom 2. Acute cough - Respiratory panel result pending.  - Strep A results pending. - Prednisone and Benzonatate capsules as prescribed. Counseled on medication adherence.  - Follow-up with primary provider as  scheduled.  - COVID-19, Flu A+B and RSV - benzonatate (TESSALON PERLES) 100 MG capsule; Take 1 capsule (100 mg total) by mouth 3 (three) times daily as needed for cough.  Dispense: 30 capsule; Refill: 0 - predniSONE (DELTASONE) 10 MG tablet; Take 6 tablets (60 mg total)  by mouth daily with breakfast for 1 day, THEN 5 tablets (50 mg total) daily with breakfast for 1 day, THEN 4 tablets (40 mg total) daily with breakfast for 1 day, THEN 3 tablets (30 mg total) daily with breakfast for 1 day, THEN 2 tablets (20 mg total) daily with breakfast for 1 day, THEN 1 tablet (10 mg total) daily with breakfast for 1 day.  Dispense: 21 tablet; Refill: 0 - Culture, Group A Strep - Rapid Strep A; Future    Patient was given the opportunity to ask questions.  Patient verbalized understanding of the plan and was able to repeat key elements of the plan. Patient was given clear instructions to go to Emergency Department or return to medical center if symptoms don't improve, worsen, or new problems develop.The patient verbalized understanding.   Orders Placed This Encounter  Procedures   COVID-19, Flu A+B and RSV   Culture, Group A Strep   Rapid Strep A     Requested Prescriptions   Signed Prescriptions Disp Refills   benzonatate (TESSALON PERLES) 100 MG capsule 30 capsule 0    Sig: Take 1 capsule (100 mg total) by mouth 3 (three) times daily as needed for cough.   predniSONE (DELTASONE) 10 MG tablet 21 tablet 0    Sig: Take 6 tablets (60 mg total) by mouth daily with breakfast for 1 day, THEN 5 tablets (50 mg total) daily with breakfast for 1 day, THEN 4 tablets (40 mg total) daily with breakfast for 1 day, THEN 3 tablets (30 mg total) daily with breakfast for 1 day, THEN 2 tablets (20 mg total) daily with breakfast for 1 day, THEN 1 tablet (10 mg total) daily with breakfast for 1 day.   Follow-up with primary provider as scheduled.   Tamara Herter, NP

## 2022-10-05 LAB — COVID-19, FLU A+B AND RSV
Influenza A, NAA: NOT DETECTED
Influenza B, NAA: NOT DETECTED
RSV, NAA: DETECTED — AB
SARS-CoV-2, NAA: NOT DETECTED

## 2022-10-06 ENCOUNTER — Other Ambulatory Visit: Payer: Self-pay | Admitting: Family

## 2022-10-06 DIAGNOSIS — J309 Allergic rhinitis, unspecified: Secondary | ICD-10-CM

## 2022-10-07 NOTE — Telephone Encounter (Signed)
Requested Prescriptions  Pending Prescriptions Disp Refills   montelukast (SINGULAIR) 10 MG tablet [Pharmacy Med Name: Montelukast Sodium 10 MG Oral Tablet] 90 tablet 0    Sig: TAKE 1 TABLET BY MOUTH AT BEDTIME     Pulmonology:  Leukotriene Inhibitors Passed - 10/06/2022  9:52 AM      Passed - Valid encounter within last 12 months    Recent Outpatient Visits           4 days ago Upper respiratory symptom   Primary Care at [facility], Amy J, NP   3 months ago Annual physical exam   Primary Care at [facility], Amy J, NP   6 months ago Hyperlipidemia, unspecified hyperlipidemia type   Primary Care at [facility], Amy J, NP   1 year ago Annual physical exam   Primary Care at [facility], Amy J, NP   1 year ago Encounter to establish care   Primary Care at [facility], Flonnie Hailstone, NP

## 2022-11-12 ENCOUNTER — Encounter: Payer: Self-pay | Admitting: Family

## 2022-11-13 ENCOUNTER — Other Ambulatory Visit: Payer: Self-pay

## 2022-11-13 DIAGNOSIS — G43009 Migraine without aura, not intractable, without status migrainosus: Secondary | ICD-10-CM

## 2022-11-13 MED ORDER — RIZATRIPTAN BENZOATE 10 MG PO TABS: ORAL_TABLET | ORAL | 0 refills | Status: DC

## 2022-11-26 ENCOUNTER — Ambulatory Visit
Admission: RE | Admit: 2022-11-26 | Discharge: 2022-11-26 | Disposition: A | Discharge status: Home / Self Care | Payer: Managed Care, Other (non HMO) | Source: Ambulatory Visit | Attending: Internal Medicine | Admitting: Internal Medicine

## 2022-11-26 VITALS — BP 124/79 | HR 86 | Temp 98.9°F | Resp 16

## 2022-11-26 DIAGNOSIS — J069 Acute upper respiratory infection, unspecified: Secondary | ICD-10-CM | POA: Diagnosis present

## 2022-11-26 DIAGNOSIS — Z1152 Encounter for screening for COVID-19: Secondary | ICD-10-CM | POA: Diagnosis not present

## 2022-11-26 MED ORDER — BENZONATATE 100 MG PO CAPS: 100.0000 mg | ORAL_CAPSULE | Freq: Three times a day (TID) | ORAL | 0 refills | Status: DC | PRN

## 2022-11-26 NOTE — ED Triage Notes (Signed)
Pt c/o cough, sneezing, watering eyes, nasal congestion   Onset ~ Friday   Denies pain in triage

## 2022-11-26 NOTE — Discharge Instructions (Signed)
You have a viral illness.  I have prescribed a cough medication.  COVID test is pending.  Will call if it is positive.  Please follow-up if any symptoms persist or worsen.

## 2022-11-26 NOTE — ED Provider Notes (Signed)
[facility]    CSN: [id number] Arrival date & time: 11/26/22  1413      History   Chief Complaint Chief Complaint  Patient presents with   Nasal Congestion    Sinus pressure & pain, slight cough - Entered by patient   Cough    HPI Tamara Lutz is a 62 y.o. female.   Patient presents with nasal congestion, cough, sinus pressure, sneezing, watery eyes that started about 3 to 4 days ago.  Patient denies any known sick contacts or fever at home.  Patient has taken Allegra-D and Flonase for symptoms.  Denies chest pain, shortness of breath, nausea, vomiting, diarrhea, abdominal pain.  Denies history of asthma or COPD.   Cough   Past Medical History:  Diagnosis Date   Allergy    Basal cell carcinoma 07-20-62   nod- upper right chest(CX35FU)   Hyperlipidemia    Osteopenia     Patient Active Problem List   Diagnosis Date Noted   H/O colonoscopy with polypectomy    Insomnia 05/24/2021   Osteoporosis 06/02/2019   Migraine without aura 06/02/2019   Hyperlipidemia 06/02/2019   Irritable bowel syndrome 06/02/2019    Past Surgical History:  Procedure Laterality Date   CESAREAN SECTION     CESAREAN SECTION N/A    [employer] 07/31/2020   COLONOSCOPY  2019   Dr.Gupta   DILATION AND CURETTAGE OF UTERUS     FLEXIBLE SIGMOIDOSCOPY N/A 08/11/2021   Procedure: FLEXIBLE SIGMOIDOSCOPY;  Surgeon: Carol Ada, MD;  Location: [facility];  Service: Endoscopy;  Laterality: N/A;   HEMOSTASIS CLIP PLACEMENT  08/11/2021   Procedure: HEMOSTASIS CLIP PLACEMENT;  Surgeon: Carol Ada, MD;  Location: [facility];  Service: Endoscopy;;   MOUTH SURGERY     POLYPECTOMY     SCLEROTHERAPY  08/11/2021   Procedure: Clide Deutscher;  Surgeon: Carol Ada, MD;  Location: [facility];  Service: Endoscopy;;    OB History   No obstetric history on file.      Home Medications    Prior to Admission medications   Medication Sig Start Date End Date Taking? Authorizing  Provider  benzonatate (TESSALON) 100 MG capsule Take 1 capsule (100 mg total) by mouth every 8 (eight) hours as needed for cough. 11/26/22  Yes Nefertiti Mohamad, Hildred Alamin E, FNP  atorvastatin (LIPITOR) 20 MG tablet Take 1 tablet (20 mg total) by mouth 3 (three) times a week. Take at 6 pm. 06/20/22   Camillia Herter, NP  Calcium Carb-Cholecalciferol 600-400 MG-UNIT CAPS Take 1 capsule by mouth.    [provider]  cetirizine (ZYRTEC) 10 MG chewable tablet Chew 10 mg by mouth daily.    [provider]  fluticasone (FLONASE) 50 MCG/ACT nasal spray Place 1 spray into both nostrils 2 (two) times daily. 02/09/19   Forrest Moron, MD  ibandronate (BONIVA) 150 MG tablet Take 150 mg by mouth every 30 (thirty) days. Take in the morning with a full glass of water, on an empty stomach, and do not take anything else by mouth or lie down for the next 30 min.    [provider]  montelukast (SINGULAIR) 10 MG tablet TAKE 1 TABLET BY MOUTH AT BEDTIME 10/07/22   Camillia Herter, NP  Multiple Vitamin (MULTIVITAMIN) tablet Take 1 tablet by mouth daily.    [provider]  rizatriptan (MAXALT) 10 MG tablet Take 10 mg (1 tablet total) by mouth at the start of the headache. May repeat in 2 hours x 1 if  headache persists. Max of 2 tablets/24 hours. 11/13/22   Camillia Herter, NP    Family History Family History  Problem Relation Age of Onset   Multiple sclerosis Mother    Uterine cancer Mother    Alzheimer's disease Father    Breast cancer Sister 41   Diabetes Neg Hx    Heart disease Neg Hx    Hypertension Neg Hx    Colon cancer Neg Hx    Esophageal cancer Neg Hx    Rectal cancer Neg Hx    Stomach cancer Neg Hx     Social History Social History   Tobacco Use   Smoking status: Never    Passive exposure: Never   Smokeless tobacco: Never  Vaping Use   Vaping Use: Never used  Substance Use Topics   Alcohol use: Yes    Comment: occasionally-2 per month   Drug use: No     Allergies    Patient has no known allergies.   Review of Systems Review of Systems Per HPI  Physical Exam Triage Vital Signs ED Triage Vitals [11/26/22 1442]  Enc Vitals Group     BP 124/79     Pulse Rate 86     Resp 16     Temp 98.9 F (37.2 C)     Temp Source Oral     SpO2 97 %     Weight      Height      Head Circumference      Peak Flow      Pain Score 0     Pain Loc      Pain Edu?      Excl. in [place]?    No data found.  Updated Vital Signs BP 124/79 (BP Location: Left Arm)   Pulse 86   Temp 98.9 F (37.2 C) (Oral)   Resp 16   LMP 04/09/2012   SpO2 97%   Visual Acuity Right Eye Distance:   Left Eye Distance:   Bilateral Distance:    Right Eye Near:   Left Eye Near:    Bilateral Near:     Physical Exam Constitutional:      General: She is not in acute distress.    Appearance: Normal appearance. She is not toxic-appearing or diaphoretic.  HENT:     Head: Normocephalic and atraumatic.     Right Ear: Tympanic membrane and ear canal normal.     Left Ear: Tympanic membrane and ear canal normal.     Nose: Congestion present.     Mouth/Throat:     Mouth: Mucous membranes are moist.     Pharynx: No posterior oropharyngeal erythema.  Eyes:     Extraocular Movements: Extraocular movements intact.     Conjunctiva/sclera: Conjunctivae normal.     Pupils: Pupils are equal, round, and reactive to light.  Cardiovascular:     Rate and Rhythm: Normal rate and regular rhythm.     Pulses: Normal pulses.     Heart sounds: Normal heart sounds.  Pulmonary:     Effort: Pulmonary effort is normal. No respiratory distress.     Breath sounds: Normal breath sounds. No stridor. No wheezing, rhonchi or rales.  Abdominal:     General: Abdomen is flat. Bowel sounds are normal.     Palpations: Abdomen is soft.  Musculoskeletal:        General: Normal range of motion.     Cervical back: Normal range of motion.  Skin:    General: Skin  is warm and dry.  Neurological:     General: No  focal deficit present.     Mental Status: She is alert and oriented to person, place, and time. Mental status is at baseline.  Psychiatric:        Mood and Affect: Mood normal.        Behavior: Behavior normal.      UC Treatments / Results  Labs (all labs ordered are listed, but only abnormal results are displayed) Labs Reviewed  SARS CORONAVIRUS 2 (TAT 6-24 HRS)    EKG   Radiology No results found.  Procedures Procedures (including critical care time)  Medications Ordered in UC Medications - No data to display  Initial Impression / Assessment and Plan / UC Course  I have reviewed the triage vital signs and the nursing notes.  Pertinent labs & imaging results that were available during my care of the patient were reviewed by me and considered in my medical decision making (see chart for details).     Patient presents with symptoms likely from a viral upper respiratory infection.  Do not suspect underlying cardiopulmonary process. Symptoms seem unlikely related to ACS, CHF or COPD exacerbations, pneumonia, pneumothorax. Patient is nontoxic appearing and not in need of emergent medical intervention.  COVID test pending.  Recommended symptom control with medications and supportive care.  Patient sent prescriptions.  Return if symptoms fail to improve in 1-2 weeks or you develop shortness of breath, chest pain, severe headache. Patient states understanding and is agreeable.  Discharged with PCP followup.  Final Clinical Impressions(s) / UC Diagnoses   Final diagnoses:  Viral upper respiratory tract infection with cough     Discharge Instructions      You have a viral illness.  I have prescribed a cough medication.  COVID test is pending.  Will call if it is positive.  Please follow-up if any symptoms persist or worsen.    ED Prescriptions     Medication Sig Dispense Auth. Provider   benzonatate (TESSALON) 100 MG capsule Take 1 capsule (100 mg total) by mouth  every 8 (eight) hours as needed for cough. 21 capsule [place], Michele Rockers, [place]      PDMP not reviewed this encounter.   Teodora Medici, [place] 11/26/22 [phone]

## 2022-11-27 LAB — SARS CORONAVIRUS 2 (TAT 6-24 HRS): SARS Coronavirus 2: NEGATIVE

## 2022-12-30 ENCOUNTER — Other Ambulatory Visit: Payer: Self-pay | Admitting: Family

## 2022-12-30 DIAGNOSIS — J309 Allergic rhinitis, unspecified: Secondary | ICD-10-CM

## 2023-02-20 ENCOUNTER — Other Ambulatory Visit: Payer: Self-pay | Admitting: Family

## 2023-02-20 DIAGNOSIS — E785 Hyperlipidemia, unspecified: Secondary | ICD-10-CM

## 2023-02-21 NOTE — Telephone Encounter (Signed)
Requested Prescriptions  Pending Prescriptions Disp Refills   atorvastatin (LIPITOR) 20 MG tablet [Pharmacy Med Name: Atorvastatin Calcium 20 MG Oral Tablet] 30 tablet 0    Sig: TAKE 1 TABLET BY MOUTH THREE TIMES A WEEK AT  6  PM     Cardiovascular:  Antilipid - Statins Failed - 02/20/2023  8:52 PM      Failed - Lipid Panel in normal range within the last 12 months    Cholesterol, Total  Date Value Ref Range Status  07/31/2022 219 (H) 100 - 199 mg/dL Final   LDL Chol Calc (NIH)  Date Value Ref Range Status  07/31/2022 147 (H) 0 - 99 mg/dL Final   HDL  Date Value Ref Range Status  07/31/2022 60 >39 mg/dL Final   Triglycerides  Date Value Ref Range Status  07/31/2022 68 0 - 149 mg/dL Final         Passed - Patient is not pregnant      Passed - Valid encounter within last 12 months    Recent Outpatient Visits           4 months ago Upper respiratory symptom   [place] [facility], Amy J, NP   8 months ago Annual physical exam   [place] [facility], Amy J, NP   11 months ago Hyperlipidemia, unspecified hyperlipidemia type   [facility], [place], NP   1 year ago Annual physical exam   [place] [facility], Amy J, NP   1 year ago Encounter to establish care   [facility], Salomon Fick, NP

## 2023-03-26 ENCOUNTER — Other Ambulatory Visit: Payer: Self-pay | Admitting: Family

## 2023-03-26 DIAGNOSIS — G43009 Migraine without aura, not intractable, without status migrainosus: Secondary | ICD-10-CM

## 2023-03-27 MED ORDER — RIZATRIPTAN BENZOATE 10 MG PO TABS: ORAL_TABLET | ORAL | 0 refills | Status: DC

## 2023-03-27 NOTE — Telephone Encounter (Signed)
Complete

## 2023-03-28 ENCOUNTER — Other Ambulatory Visit: Payer: Self-pay | Admitting: Family Medicine

## 2023-03-28 ENCOUNTER — Encounter: Payer: Self-pay | Admitting: Family

## 2023-03-28 DIAGNOSIS — G43009 Migraine without aura, not intractable, without status migrainosus: Secondary | ICD-10-CM

## 2023-03-28 NOTE — Telephone Encounter (Signed)
Med request for   rizatriptan (MAXALT) 10 MG tablet

## 2023-04-02 NOTE — Telephone Encounter (Signed)
Thank you :)

## 2023-04-22 ENCOUNTER — Other Ambulatory Visit: Payer: Self-pay | Admitting: Family

## 2023-04-22 ENCOUNTER — Encounter: Payer: Self-pay | Admitting: Family

## 2023-04-22 DIAGNOSIS — E785 Hyperlipidemia, unspecified: Secondary | ICD-10-CM

## 2023-04-23 ENCOUNTER — Other Ambulatory Visit: Payer: Self-pay

## 2023-04-23 DIAGNOSIS — E785 Hyperlipidemia, unspecified: Secondary | ICD-10-CM

## 2023-04-23 MED ORDER — ATORVASTATIN CALCIUM 20 MG PO TABS: ORAL_TABLET | ORAL | 0 refills | Status: DC

## 2023-05-15 ENCOUNTER — Ambulatory Visit (HOSPITAL_BASED_OUTPATIENT_CLINIC_OR_DEPARTMENT_OTHER): Payer: Managed Care, Other (non HMO)

## 2023-05-24 ENCOUNTER — Ambulatory Visit (HOSPITAL_BASED_OUTPATIENT_CLINIC_OR_DEPARTMENT_OTHER)
Admission: RE | Admit: 2023-05-24 | Discharge: 2023-05-24 | Disposition: A | Discharge status: Home / Self Care | Payer: Managed Care, Other (non HMO) | Source: Ambulatory Visit | Attending: Internal Medicine | Admitting: Internal Medicine

## 2023-05-24 ENCOUNTER — Other Ambulatory Visit: Payer: Self-pay | Admitting: Family

## 2023-05-24 DIAGNOSIS — I77819 Aortic ectasia, unspecified site: Secondary | ICD-10-CM | POA: Insufficient documentation

## 2023-05-24 DIAGNOSIS — J309 Allergic rhinitis, unspecified: Secondary | ICD-10-CM

## 2023-05-24 MED ORDER — IOHEXOL 350 MG/ML SOLN
75.0000 mL | Freq: Once | INTRAVENOUS | Status: AC | PRN
  Administered 2023-05-24: 75 mL via INTRAVENOUS

## 2023-05-27 ENCOUNTER — Other Ambulatory Visit: Payer: Self-pay | Admitting: Family

## 2023-05-27 ENCOUNTER — Other Ambulatory Visit: Payer: Self-pay

## 2023-05-27 DIAGNOSIS — G43009 Migraine without aura, not intractable, without status migrainosus: Secondary | ICD-10-CM

## 2023-05-27 DIAGNOSIS — J309 Allergic rhinitis, unspecified: Secondary | ICD-10-CM

## 2023-05-27 MED ORDER — MONTELUKAST SODIUM 10 MG PO TABS: 10.0000 mg | ORAL_TABLET | Freq: Every day | ORAL | 0 refills | Status: DC

## 2023-06-02 MED ORDER — RIZATRIPTAN BENZOATE 10 MG PO TABS: ORAL_TABLET | ORAL | 0 refills | Status: DC

## 2023-06-04 NOTE — Progress Notes (Unsigned)
Cardiology Office Note:   Date:  06/05/2023  ID:  Randye Lobo, DOB 1960/11/05, MRN [id number] PCP:  Tollie Eth, NP  [facility] Providers Cardiologist:  Alverda Skeans, MD Referring MD: Tollie Eth, NP   Chief Complaint/Reason for Referral: Aortic dilatation ASSESSMENT:    1. Aortic dilatation (HCC)     PLAN:   In order of problems listed above: 1.  Aortic dilatation: Most recent CT scan demonstrated normal diameter of the aorta.  Will keep follow-up with me open-ended.  Will obtain another CT scan in 2 years.  If this shows no aneurysm then no further imaging is required.  2.  Hyperlipidemia: Per primary care provider.  Her CT calcium score is 0 so she does not have a indication for intensive lipid-lowering.             Dispo:  Return if symptoms worsen or fail to improve.      Medication Adjustments/Labs and Tests Ordered: Current medicines are reviewed at length with the patient today.  Concerns regarding medicines are outlined above.  The following changes have been made:  no change   Labs/tests ordered: Orders Placed This Encounter  Procedures   CT ANGIO CHEST AORTA W/CM & OR WO/CM   EKG 12-Lead    Medication Changes: No orders of the defined types were placed in this encounter.   Current medicines are reviewed at length with the patient today.  The patient does not have concerns regarding medicines.  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 female with the indicated medical history here for recommendations regarding incidentally noted aortic dilatation.  The patient was referred for a calcium score CT which demonstrated very reassuring findings as detailed above.  She was incidentally noted to have mild aortic dilatation of 40 mm.  She is referred for further  recommendations.   The patient does not smoke.  She does not have a family history of aneurysms.  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  She denies any regular exertional angina.  She denies any routine exertional dyspnea.  She says she did get fairly winded [place] the [place] recently about.  She has required no emergency room visits or hospitalizations.  She is otherwise well without significant complaints.  Plan: Obtain chest CTA.  August 2024: In the interim the patient had a CT angiography study of the chest which demonstrated the largest diameter of the thoracic aorta to be 36 mm in the mid ascending thoracic aorta which is normal.  The patient is doing very well.  She and her family went to [place] [facility] [place] for summer vacation and she did hiking up there without any significant issues other than having to adjust to the high altitude.  She has had no other serious medical issues and denies any exertional angina, routine exertional dyspnea, presyncope, syncope, palpitations, Marina Goodell says nocturnal dyspnea, orthopnea.  She is otherwise well and without complaints.         Current Medications: Current Meds  Medication Sig   atorvastatin (LIPITOR) 20 MG tablet TAKE 1 TABLET BY MOUTH 3X A WEEK AT 6PM   Calcium Carb-Cholecalciferol 600-400 MG-UNIT CAPS Take 1 capsule by mouth.   cetirizine (ZYRTEC) 10 MG chewable  tablet Chew 10 mg by mouth daily.   fluticasone (FLONASE) 50 MCG/ACT nasal spray Place 1 spray into both nostrils 2 (two) times daily.   ibandronate (BONIVA) 150 MG tablet Take 150 mg by mouth every 30 (thirty) days. Take in the morning with a full glass of water, on an empty stomach, and do not take anything else by mouth or lie down for the next 30 min.   montelukast (SINGULAIR) 10 MG tablet Take 1 tablet (10 mg total) by mouth at bedtime.   Multiple Vitamin (MULTIVITAMIN) tablet Take 1 tablet by mouth daily.   rizatriptan (MAXALT) 10  MG tablet Take 10 mg (1 tablet total) by mouth at the start of the headache. May repeat in 2 hours x 1 if headache persists. Max of 2 tablets/24 hours.   Current Facility-Administered Medications for the 06/05/23 encounter (Office Visit) with Orbie Pyo, MD  Medication   0.9 %  sodium chloride infusion     Allergies:    Patient has no known allergies.   Social History:   Social History   Tobacco Use   Smoking status: Never    Passive exposure: Never   Smokeless tobacco: Never  Vaping Use   Vaping status: Never Used  Substance Use Topics   Alcohol use: Yes    Comment: occasionally-2 per month   Drug use: No     Family Hx: Family History  Problem Relation Age of Onset   Multiple sclerosis Mother    Uterine cancer Mother    Alzheimer's disease Father    Breast cancer Sister 25   Diabetes Neg Hx    Heart disease Neg Hx    Hypertension Neg Hx    Colon cancer Neg Hx    Esophageal cancer Neg Hx    Rectal cancer Neg Hx    Stomach cancer Neg Hx      Review of Systems:   Please see the history of present illness.    All other systems reviewed and are negative.     EKGs/Labs/Other Test Reviewed:   EKG: August 2023 sinus rhythm nonspecific ST and T wave changes that I personally reviewed  EKG Interpretation Date/Time:  Wednesday June 05 2023 15:16:35 EDT Ventricular Rate:  76 PR Interval:  144 QRS Duration:  84 QT Interval:  376 QTC Calculation: 423 R Axis:   37  Text Interpretation: Normal sinus rhythm Nonspecific ST abnormality No previous ECGs available Confirmed by Alverda Skeans ([id number]) on 06/05/2023 3:19:01 PM        Prior CV studies reviewed: Cardiac Studies & Procedures          CT SCANS  CT CARDIAC SCORING (SELF PAY ONLY) 05/03/2022  Addendum 05/03/2022  1:31 PM ADDENDUM REPORT: 05/03/2022 13:29  EXAM: OVER-READ INTERPRETATION  CT CHEST  The following report is an over-read performed by radiologist Dr. Maryelizabeth Rowan [facility] on 05/03/2022. This over-read does not include interpretation of cardiac or coronary anatomy or pathology. The calcium score interpretation by the cardiologist is attached.  COMPARISON:  None.  FINDINGS: Limited view of the lung parenchyma demonstrates no suspicious nodularity. Airways are normal.  Limited view of the mediastinum demonstrates no adenopathy. Esophagus normal.  Limited view of the upper abdomen demonstrates a small low-density lesion RIGHT hepatic lobe measuring 6 mm. Lesion too small to characterize.  Limited view of the skeleton and chest wall is unremarkable.  IMPRESSION: Small hypodense lesion RIGHT hepatic lobe is favored benign cysts but too small to characterize. In  the absence of risk factors for hepatic malignancy, no specific follow-up recommended.   Electronically Signed By: Genevive Bi M.D. On: 05/03/2022 13:29  Narrative CLINICAL DATA:  Risk stratification: 63 Year-old White Female  EXAM: Coronary Calcium Score  TECHNIQUE: The patient was scanned on a [employer]. Axial non-contrast 3 mm slices were carried out through the heart. The data set was analyzed on a dedicated work station and scored using the Agatson method.  FINDINGS: Non-cardiac: See separate report from [facility].  Ascending Aorta: Evidence of mild ascending aortic dilation, 40 mm, on non-contrasted study.  Aortic Valve Calcium score: 0  Mitral annular calcification: none  Pericardium: Normal.  Coronary arteries: Normal origins.  Coronary Calcium Score:  Left main: 0  Left anterior descending artery: 0  Left circumflex artery: 0  Right coronary artery: 0  Ramus intermedius artery: 0  Total: 0  Percentile: 1st for age, sex, and race matched control.  IMPRESSION: 1. Coronary calcium score of 0. This was 1st percentile for age, gender, and race matched controls.  2. Evidence of mild ascending aortic dilation, 40  mm, on non-contrasted study. Consider secondary imaging modality (echocardiogram, CTA Aorta Protocol, MRA Aorta Protocol) if clinically indicated.  RECOMMENDATIONS:  Coronary artery calcium (CAC) score is a strong predictor of incident coronary heart disease (CHD) and provides predictive information beyond traditional risk factors. CAC scoring is reasonable to use in the decision to withhold, postpone, or initiate statin therapy in intermediate-risk or selected borderline-risk asymptomatic adults (age 11-75 years and LDL-C >=70 to <190 mg/dL) who do not have diabetes or established atherosclerotic cardiovascular disease (ASCVD).* In intermediate-risk (10-year ASCVD risk >=7.5% to <20%) adults or selected borderline-risk (10-year ASCVD risk >=5% to <7.5%) adults in whom a CAC score is measured for the purpose of making a treatment decision the following recommendations have been made:  If CAC = 0, it is reasonable to withhold statin therapy and reassess in 5 to 10 years, as long as higher risk conditions are absent (diabetes mellitus, family history of premature CHD in first degree relatives (males <55 years; females <65 years), cigarette smoking, LDL >=190 mg/dL or other independent risk factors).  If CAC is 1 to 99, it is reasonable to initiate statin therapy for patients >=55 years of age.  If CAC is >=100 or >=75th percentile, it is reasonable to initiate statin therapy at any age.  Cardiology referral should be considered for patients with CAC scores =400 or >=75th percentile.  *2018 AHA/ACC/AACVPR/AAPA/ABC/ACPM/ADA/AGS/APhA/ASPC/NLA/PCNA Guideline on the Management of Blood Cholesterol: A Report of the American College of Cardiology/American Heart Association Task Force on Clinical Practice Guidelines. J Am Coll Cardiol. 2019;73(24):3168-3209.  Riley Lam, MD  Electronically Signed: By: Riley Lam M.D. On: 05/03/2022 12:44          Recent  Labs: 06/19/2022: ALT 19; BUN 16; Creatinine, Ser 0.81; Hemoglobin 12.9; Platelets 227; Potassium 4.2; Sodium 143; TSH 2.880   Lipid Panel    Component Value Date/Time   CHOL 219 (H) 07/31/2022 0835   TRIG 68 07/31/2022 0835   HDL 60 07/31/2022 0835   CHOLHDL 3.7 07/31/2022 0835   LDLCALC 147 (H) 07/31/2022 0835    Risk Assessment/Calculations:          Physical Exam:   VS:  BP 120/80 (BP Location: Left Arm, Patient Position: Sitting, Cuff Size: Normal)   Pulse 76   Ht 5\' 2"  (1.575 m)   Wt 145 lb (65.8 kg)   LMP 04/09/2012  BMI 26.52 kg/m        Wt Readings from Last 3 Encounters:  06/05/23 145 lb (65.8 kg)  10/03/22 140 lb (63.5 kg)  06/19/22 138 lb (62.6 kg)      GENERAL:  No apparent distress, AOx3 HEENT:  No carotid bruits, +2 carotid impulses, no scleral icterus CAR: RRR no murmurs, gallops, rubs, or thrills RES:  Clear to auscultation bilaterally ABD:  Soft, nontender, nondistended, positive bowel sounds x 4 VASC:  +2 radial pulses, +2 carotid pulses NEURO:  CN 2-12 grossly intact; motor and sensory grossly intact PSYCH:  No active depression or anxiety EXT:  No edema, ecchymosis, or cyanosis  Signed, Orbie Pyo, MD  06/05/2023 3:54 PM    [facility] [address] [place], [place], [place]  [address] Phone: [phone]; Fax: [phone]   Note:  This document was prepared using Dragon voice recognition software and may include unintentional dictation errors.

## 2023-06-05 ENCOUNTER — Ambulatory Visit: Payer: Managed Care, Other (non HMO) | Attending: Internal Medicine | Admitting: Internal Medicine

## 2023-06-05 ENCOUNTER — Encounter: Payer: Self-pay | Admitting: Internal Medicine

## 2023-06-05 VITALS — BP 120/80 | HR 76 | Ht 62.0 in | Wt 145.0 lb

## 2023-06-05 DIAGNOSIS — I77819 Aortic ectasia, unspecified site: Secondary | ICD-10-CM

## 2023-06-05 NOTE — Patient Instructions (Signed)
Medication Instructions:  Your physician recommends that you continue on your current medications as directed. Please refer to the Current Medication list given to you today.  *If you need a refill on your cardiac medications before your next appointment, please call your pharmacy*  Testing/Procedures: CT of Aorta in 2 years   Follow-Up: At [facility], you and your health needs are our priority.  As part of our continuing mission to provide you with exceptional heart care, we have created designated Provider Care Teams.  These Care Teams include your primary Cardiologist (physician) and Advanced Practice Providers (APPs -  Physician Assistants and Nurse Practitioners) who all work together to provide you with the care you need, when you need it.  Your next appointment:   As needed   Provider:   Orbie Pyo, MD

## 2023-06-24 ENCOUNTER — Ambulatory Visit (INDEPENDENT_AMBULATORY_CARE_PROVIDER_SITE_OTHER): Payer: Managed Care, Other (non HMO) | Admitting: Nurse Practitioner

## 2023-06-24 ENCOUNTER — Encounter: Payer: Self-pay | Admitting: Nurse Practitioner

## 2023-06-24 VITALS — BP 124/82 | HR 92 | Ht 63.25 in | Wt 143.4 lb

## 2023-06-24 DIAGNOSIS — E785 Hyperlipidemia, unspecified: Secondary | ICD-10-CM

## 2023-06-24 DIAGNOSIS — Z0000 Encounter for general adult medical examination without abnormal findings: Secondary | ICD-10-CM

## 2023-06-24 DIAGNOSIS — E782 Mixed hyperlipidemia: Secondary | ICD-10-CM

## 2023-06-24 DIAGNOSIS — M810 Age-related osteoporosis without current pathological fracture: Secondary | ICD-10-CM

## 2023-06-24 DIAGNOSIS — Z23 Encounter for immunization: Secondary | ICD-10-CM

## 2023-06-24 DIAGNOSIS — G43009 Migraine without aura, not intractable, without status migrainosus: Secondary | ICD-10-CM | POA: Diagnosis not present

## 2023-06-24 DIAGNOSIS — J309 Allergic rhinitis, unspecified: Secondary | ICD-10-CM

## 2023-06-24 MED ORDER — RIZATRIPTAN BENZOATE 10 MG PO TABS: ORAL_TABLET | ORAL | 6 refills | Status: DC

## 2023-06-24 MED ORDER — FLUTICASONE PROPIONATE 50 MCG/ACT NA SUSP: 2.0000 | Freq: Every day | NASAL | 6 refills | Status: DC

## 2023-06-24 NOTE — Patient Instructions (Addendum)
Sleep Aids  Doxylamine Succinate: Brand name Unisom  Diphenhydramine: Brand name Zquil   Insomnia Insomnia is a sleep disorder that makes it difficult to fall asleep or stay asleep. Insomnia can cause fatigue, low energy, difficulty concentrating, mood swings, and poor performance at work or school. There are three different ways to classify insomnia: Difficulty falling asleep. Difficulty staying asleep. Waking up too Christain Niznik in the morning. Any type of insomnia can be long-term (chronic) or short-term (acute). Both are common. Short-term insomnia usually lasts for 3 months or less. Chronic insomnia occurs at least three times a week for longer than 3 months. What are the causes? Insomnia may be caused by another condition, situation, or substance, such as: Having certain mental health conditions, such as anxiety and depression. Using caffeine, alcohol, tobacco, or drugs. Having gastrointestinal conditions, such as gastroesophageal reflux disease (GERD). Having certain medical conditions. These include: Asthma. Alzheimer's disease. Stroke. Chronic pain. An overactive thyroid gland (hyperthyroidism). Other sleep disorders, such as restless legs syndrome and sleep apnea. Menopause. Sometimes, the cause of insomnia may not be known. What increases the risk? Risk factors for insomnia include: Gender. Females are affected more often than males. Age. Insomnia is more common as people get older. Stress and certain medical and mental health conditions. Lack of exercise. Having an irregular work schedule. This may include working night shifts and traveling between different time zones. What are the signs or symptoms? If you have insomnia, the main symptom is having trouble falling asleep or having trouble staying asleep. This may lead to other symptoms, such as: Feeling tired or having low energy. Feeling nervous about going to sleep. Not feeling rested in the morning. Having trouble  concentrating. Feeling irritable, anxious, or depressed. How is this diagnosed? This condition may be diagnosed based on: Your symptoms and medical history. Your health care provider may ask about: Your sleep habits. Any medical conditions you have. Your mental health. A physical exam. How is this treated? Treatment for insomnia depends on the cause. Treatment may focus on treating an underlying condition that is causing the insomnia. Treatment may also include: Medicines to help you sleep. Counseling or therapy. Lifestyle adjustments to help you sleep better. Follow these instructions at home: Eating and drinking  Limit or avoid alcohol, caffeinated beverages, and products that contain nicotine and tobacco, especially close to bedtime. These can disrupt your sleep. Do not eat a large meal or eat spicy foods right before bedtime. This can lead to digestive discomfort that can make it hard for you to sleep. Sleep habits  Keep a sleep diary to help you and your health care provider figure out what could be causing your insomnia. Write down: When you sleep. When you wake up during the night. How well you sleep and how rested you feel the next day. Any side effects of medicines you are taking. What you eat and drink. Make your bedroom a dark, comfortable place where it is easy to fall asleep. Put up shades or blackout curtains to block light from outside. Use a white noise machine to block noise. Keep the temperature cool. Limit screen use before bedtime. This includes: Not watching TV. Not using your smartphone, tablet, or computer. Stick to a routine that includes going to bed and waking up at the same times every day and night. This can help you fall asleep faster. Consider making a quiet activity, such as reading, part of your nighttime routine. Try to avoid taking naps during the day so that  you sleep better at night. Get out of bed if you are still awake after 15 minutes of  trying to sleep. Keep the lights down, but try reading or doing a quiet activity. When you feel sleepy, go back to bed. General instructions Take over-the-counter and prescription medicines only as told by your health care provider. Exercise regularly as told by your health care provider. However, avoid exercising in the hours right before bedtime. Use relaxation techniques to manage stress. Ask your health care provider to suggest some techniques that may work well for you. These may include: Breathing exercises. Routines to release muscle tension. Visualizing peaceful scenes. Make sure that you drive carefully. Do not drive if you feel very sleepy. Keep all follow-up visits. This is important. Contact a health care provider if: You are tired throughout the day. You have trouble in your daily routine due to sleepiness. You continue to have sleep problems, or your sleep problems get worse. Get help right away if: You have thoughts about hurting yourself or someone else. Get help right away if you feel like you may hurt yourself or others, or have thoughts about taking your own life. Go to your nearest emergency room or: Call 911. Call the National Suicide Prevention Lifeline at 619 398 3769 or 988. This is open 24 hours a day. Text the Crisis Text Line at (639)105-5589. Summary Insomnia is a sleep disorder that makes it difficult to fall asleep or stay asleep. Insomnia can be long-term (chronic) or short-term (acute). Treatment for insomnia depends on the cause. Treatment may focus on treating an underlying condition that is causing the insomnia. Keep a sleep diary to help you and your health care provider figure out what could be causing your insomnia. This information is not intended to replace advice given to you by your health care provider. Make sure you discuss any questions you have with your health care provider. Document Revised: 09/04/2021 Document Reviewed: 09/04/2021 Elsevier  Patient Education  2024 [employer].

## 2023-06-24 NOTE — Progress Notes (Unsigned)
Shawna Clamp, DNP, AGNP-c [facility] [address] [place], [place] [address] Main Office [phone]  BP 124/82   Pulse 92   Ht 5' 3.25" (1.607 m)   Wt 143 lb 6.4 oz (65 kg)   LMP 04/09/2012   BMI 25.20 kg/m    Subjective:    Patient ID: Tamara Lutz, female    DOB: December 19, 1960, 62 y.o.   MRN: [id number]  HPI: Tamara Lutz is a 62 y.o. female presenting on 06/24/2023 for comprehensive medical examination. Tamara Lutz is a new patient today.  Current medical concerns include:none  sleep A comprehensive review of systems was negative.  IMMUNIZATIONS:   Flu Vaccine: Flu vaccine declined, patient will complete later Prevnar 13: Prevnar 13 N/A for this patient Prevnar 20: Prevnar 20 N/A for this patient Pneumovax 23: Pneumovax 23 N/A for this patient Shingrix: completed HPV: N/A or Aged Out Tetanus: Tetanus completed in the last 10 years COVID: Completed today RSV: No  HEALTH MAINTENANCE: Pap Smear HM Status: is up to date Mammogram HM Status: is already scheduled Colon Cancer Screening HM Status: is up to date Bone Density HM Status: up to date STI Testing HM Status: was declined  Lung CT HM Status: was declined   Concerns with vision, hearing, or dentition: Yes: hearing in loud spaces  Dietary Habits: Regular diet Exercise: yoga daily  Most Recent Depression Screen:     06/24/2023    3:15 PM 03/19/2022    8:53 AM 05/24/2021    9:10 AM 03/15/2021   10:57 AM 10/13/2020    1:41 PM  Depression screen PHQ 2/9  Decreased Interest 0 0 0 0 0  Down, Depressed, Hopeless 0 0 0 0 0  PHQ - 2 Score 0 0 0 0 0  Altered sleeping    0   Tired, decreased energy    0   Change in appetite    0   Feeling bad or failure about yourself     0   Trouble concentrating    0   Moving slowly or fidgety/restless    0   Suicidal thoughts    0   PHQ-9 Score    0   Difficult doing work/chores    Not difficult at all    Most Recent Anxiety Screen:      No  data to display         Most Recent Fall Screen:    06/24/2023    3:15 PM 03/19/2022    8:52 AM 05/24/2021    9:10 AM 10/13/2020    1:41 PM 08/22/2020    2:53 PM  Fall Risk   Falls in the past year? 0 0 0 0 0  Number falls in past yr: 0 0 0 0 0  Injury with Fall? 0 0 0 0 0  Risk for fall due to : No Fall Risks No Fall Risks No Fall Risks    Follow up Falls evaluation completed Falls evaluation completed Falls evaluation completed Falls evaluation completed Falls evaluation completed    Past medical history, surgical history, medications, allergies, family history and social history reviewed with patient today and changes made to appropriate areas of the chart.  Past Medical History:  Past Medical History:  Diagnosis Date   Allergy    Basal cell carcinoma 11-Jun-1962   nod- upper right chest(CX35FU)   Hyperlipidemia    Osteopenia    Medications:  Current Outpatient Medications on File Prior to Visit  Medication Sig  Calcium Carb-Cholecalciferol 600-400 MG-UNIT CAPS Take 1 capsule by mouth.   cetirizine (ZYRTEC) 10 MG chewable tablet Chew 10 mg by mouth daily.   Cholecalciferol (VITAMIN D) 50 MCG (2000 UT) CAPS Take by mouth.   ibandronate (BONIVA) 150 MG tablet Take 150 mg by mouth every 30 (thirty) days. Take in the morning with a full glass of water, on an empty stomach, and do not take anything else by mouth or lie down for the next 30 min.   Magnesium 500 MG CAPS Take by mouth.   montelukast (SINGULAIR) 10 MG tablet Take 1 tablet (10 mg total) by mouth at bedtime.   Multiple Vitamin (MULTIVITAMIN) tablet Take 1 tablet by mouth daily.   Current Facility-Administered Medications on File Prior to Visit  Medication   0.9 %  sodium chloride infusion   Surgical History:  Past Surgical History:  Procedure Laterality Date   CESAREAN SECTION     CESAREAN SECTION N/A    [employer] 07/31/2020   COLONOSCOPY  2019   Dr.Gupta   DILATION AND CURETTAGE OF UTERUS     FLEXIBLE  SIGMOIDOSCOPY N/A 08/11/2021   Procedure: FLEXIBLE SIGMOIDOSCOPY;  Surgeon: Jeani Hawking, MD;  Location: [facility];  Service: Endoscopy;  Laterality: N/A;   HEMOSTASIS CLIP PLACEMENT  08/11/2021   Procedure: HEMOSTASIS CLIP PLACEMENT;  Surgeon: Jeani Hawking, MD;  Location: [facility];  Service: Endoscopy;;   MOUTH SURGERY     POLYPECTOMY     SCLEROTHERAPY  08/11/2021   Procedure: Susa Day;  Surgeon: Jeani Hawking, MD;  Location: [facility];  Service: Endoscopy;;   Allergies:  No Known Allergies Family History:  Family History  Problem Relation Age of Onset   Multiple sclerosis Mother    Uterine cancer Mother    Alzheimer's disease Father    Breast cancer Sister 51   Diabetes Neg Hx    Heart disease Neg Hx    Hypertension Neg Hx    Colon cancer Neg Hx    Esophageal cancer Neg Hx    Rectal cancer Neg Hx    Stomach cancer Neg Hx        Objective:    BP 124/82   Pulse 92   Ht 5' 3.25" (1.607 m)   Wt 143 lb 6.4 oz (65 kg)   LMP 04/09/2012   BMI 25.20 kg/m   Wt Readings from Last 3 Encounters:  06/24/23 143 lb 6.4 oz (65 kg)  06/05/23 145 lb (65.8 kg)  10/03/22 140 lb (63.5 kg)    Physical Exam Vitals and nursing note reviewed.  Constitutional:      General: She is not in acute distress.    Appearance: Normal appearance.  HENT:     Head: Normocephalic and atraumatic.     Right Ear: Hearing, tympanic membrane, ear canal and external ear normal.     Left Ear: Hearing, tympanic membrane, ear canal and external ear normal.     Nose: Nose normal.     Right Sinus: No maxillary sinus tenderness or frontal sinus tenderness.     Left Sinus: No maxillary sinus tenderness or frontal sinus tenderness.     Mouth/Throat:     Lips: Pink.     Mouth: Mucous membranes are moist.     Pharynx: Oropharynx is clear.  Eyes:     General: Lids are normal. Vision grossly intact.     Extraocular Movements: Extraocular movements intact.     Conjunctiva/sclera: Conjunctivae  normal.     Pupils: Pupils are equal, round,  and reactive to light.     Funduscopic exam:    Right eye: Red reflex present.        Left eye: Red reflex present.    Visual Fields: Right eye visual fields normal and left eye visual fields normal.  Neck:     Thyroid: No thyromegaly.     Vascular: No carotid bruit.  Cardiovascular:     Rate and Rhythm: Normal rate and regular rhythm.     Chest Wall: PMI is not displaced.     Pulses: Normal pulses.          Dorsalis pedis pulses are 2+ on the right side and 2+ on the left side.       Posterior tibial pulses are 2+ on the right side and 2+ on the left side.     Heart sounds: Normal heart sounds. No murmur heard. Pulmonary:     Effort: Pulmonary effort is normal. No respiratory distress.     Breath sounds: Normal breath sounds.  Abdominal:     General: Abdomen is flat. Bowel sounds are normal. There is no distension.     Palpations: Abdomen is soft. There is no hepatomegaly, splenomegaly or mass.     Tenderness: There is no abdominal tenderness. There is no right CVA tenderness, left CVA tenderness, guarding or rebound.  Musculoskeletal:        General: Normal range of motion.     Cervical back: Full passive range of motion without pain, normal range of motion and neck supple. No tenderness.     Right lower leg: No edema.     Left lower leg: No edema.  Feet:     Left foot:     Toenail Condition: Left toenails are normal.  Lymphadenopathy:     Cervical: No cervical adenopathy.     Upper Body:     Right upper body: No supraclavicular adenopathy.     Left upper body: No supraclavicular adenopathy.  Skin:    General: Skin is warm and dry.     Capillary Refill: Capillary refill takes less than 2 seconds.     Nails: There is no clubbing.  Neurological:     General: No focal deficit present.     Mental Status: She is alert and oriented to person, place, and time.     GCS: GCS eye subscore is 4. GCS verbal subscore is 5. GCS motor  subscore is 6.     Sensory: Sensation is intact.     Motor: Motor function is intact.     Coordination: Coordination is intact.     Gait: Gait is intact.     Deep Tendon Reflexes: Reflexes are normal and symmetric.  Psychiatric:        Attention and Perception: Attention normal.        Mood and Affect: Mood normal.        Speech: Speech normal.        Behavior: Behavior normal. Behavior is cooperative.        Thought Content: Thought content normal.        Cognition and Memory: Cognition and memory normal.        Judgment: Judgment normal.     Results for orders placed or performed in visit on 06/24/23  CBC with Differential/Platelet  Result Value Ref Range   WBC 7.6 3.4 - 10.8 x10E3/uL   RBC 4.70 3.77 - 5.28 x10E6/uL   Hemoglobin 13.7 11.1 - 15.9 g/dL   Hematocrit 81.1 91.4 -  46.6 %   MCV 90 79 - 97 fL   MCH 29.1 26.6 - 33.0 pg   MCHC 32.5 31.5 - 35.7 g/dL   RDW 91.4 78.2 - 95.6 %   Platelets 274 150 - 450 x10E3/uL   Neutrophils 69 Not Estab. %   Lymphs 21 Not Estab. %   Monocytes 7 Not Estab. %   Eos 2 Not Estab. %   Basos 1 Not Estab. %   Neutrophils Absolute 5.2 1.4 - 7.0 x10E3/uL   Lymphocytes Absolute 1.6 0.7 - 3.1 x10E3/uL   Monocytes Absolute 0.5 0.1 - 0.9 x10E3/uL   EOS (ABSOLUTE) 0.2 0.0 - 0.4 x10E3/uL   Basophils Absolute 0.1 0.0 - 0.2 x10E3/uL   Immature Granulocytes 0 Not Estab. %   Immature Grans (Abs) 0.0 0.0 - 0.1 x10E3/uL  CMP14+EGFR  Result Value Ref Range   Glucose 82 70 - 99 mg/dL   BUN 15 8 - 27 mg/dL   Creatinine, Ser 2.13 0.57 - 1.00 mg/dL   eGFR 77 >08 MV/HQI/6.96   BUN/Creatinine Ratio 17 12 - 28   Sodium 141 134 - 144 mmol/L   Potassium 4.2 3.5 - 5.2 mmol/L   Chloride 103 96 - 106 mmol/L   CO2 24 20 - 29 mmol/L   Calcium 10.0 8.7 - 10.3 mg/dL   Total Protein 7.2 6.0 - 8.5 g/dL   Albumin 4.4 3.9 - 4.9 g/dL   Globulin, Total 2.8 1.5 - 4.5 g/dL   Bilirubin Total 0.2 0.0 - 1.2 mg/dL   Alkaline Phosphatase 74 44 - 121 IU/L   AST 28 0 - 40  IU/L   ALT 17 0 - 32 IU/L  Hemoglobin A1c  Result Value Ref Range   Hgb A1c MFr Bld 5.2 4.8 - 5.6 %   Est. average glucose Bld gHb Est-mCnc 103 mg/dL  Lipid panel  Result Value Ref Range   Cholesterol, Total 224 (H) 100 - 199 mg/dL   Triglycerides 295 0 - 149 mg/dL   HDL 56 >28 mg/dL   VLDL Cholesterol Cal 20 5 - 40 mg/dL   LDL Chol Calc (NIH) 413 (H) 0 - 99 mg/dL   Chol/HDL Ratio 4.0 0.0 - 4.4 ratio  TSH  Result Value Ref Range   TSH 1.690 0.450 - 4.500 uIU/mL         Assessment & Plan:   Problem List Items Addressed This Visit     Osteoporosis    Chronic. Recommend weight training activities and supplementation of calcium and vitamin D. Continue Boniva. Will monitor labs today. No acute findings.       Relevant Medications   Cholecalciferol (VITAMIN D) 50 MCG (2000 UT) CAPS   Other Relevant Orders   CMP14+EGFR (Completed)   Migraine without aura    Chronic. Well controlled. No concerns at this time.       Relevant Medications   rizatriptan (MAXALT) 10 MG tablet   Hyperlipidemia    Chronic. Recent lipids well controlled. She is working on diet and exercise. She would like to stop the statin today, if possible, as she does not wish to take a daily medication. We will monitor labs and plan to hold the medication for now. If lipids are elevated in the future, we can consider restarting.       Relevant Orders   Lipid panel (Completed)   Encounter for annual physical exam - Primary    CPE completed today. Review of HM activities and recommendations discussed and provided on AVS.  Anticipatory guidance, diet, and exercise recommendations provided. Medications, allergies, and hx reviewed and updated as necessary. Orders placed as listed below.  Plan: - Labs ordered. Will make changes as necessary based on results.  - I will review these results and send recommendations via MyChart or a telephone call.  - F/U with CPE in 1 year or sooner for acute/chronic health needs as  directed.        Relevant Orders   CBC with Differential/Platelet (Completed)   CMP14+EGFR (Completed)   Hemoglobin A1c (Completed)   Lipid panel (Completed)   TSH (Completed)   Other Visit Diagnoses     Need for COVID-19 vaccine       Relevant Orders   Pfizer Comirnaty Covid -19 Vaccine 4yrs and older (Completed)   Allergic rhinitis, unspecified seasonality, unspecified trigger       Relevant Medications   fluticasone (FLONASE) 50 MCG/ACT nasal spray   Other Relevant Orders   CBC with Differential/Platelet (Completed)          Follow up plan: Return in about 1 year (around 06/23/2024) for CPE.  NEXT PREVENTATIVE PHYSICAL DUE IN 1 YEAR.  PATIENT COUNSELING PROVIDED FOR ALL ADULT PATIENTS: A well balanced diet low in saturated fats, cholesterol, and moderation in carbohydrates.  This can be as simple as monitoring portion sizes and cutting back on sugary beverages such as soda and juice to start with.    Daily water consumption of at least 64 ounces.  Physical activity at least 180 minutes per week.  If just starting out, start 10 minutes a day and work your way up.   This can be as simple as taking the stairs instead of the elevator and walking 2-3 laps around the office  purposefully every day.   STD protection, partner selection, and regular testing if high risk.  Limited consumption of alcoholic beverages if alcohol is consumed. For men, I recommend no more than 14 alcoholic beverages per week, spread out throughout the week (max 2 per day). Avoid "binge" drinking or consuming large quantities of alcohol in one setting.  Please let me know if you feel you may need help with reduction or quitting alcohol consumption.   Avoidance of nicotine, if used. Please let me know if you feel you may need help with reduction or quitting nicotine use.   Daily mental health attention. This can be in the form of 5 minute daily meditation, prayer, journaling, yoga, reflection,  etc.  Purposeful attention to your emotions and mental state can significantly improve your overall wellbeing  and  Health.  Please know that I am here to help you with all of your health care goals and am happy to work with you to find a solution that works best for you.  The greatest advice I have received with any changes in life are to take it one step at a time, that even means if all you can focus on is the next 60 seconds, then do that and celebrate your victories.  With any changes in life, you will have set backs, and that is OK. The important thing to remember is, if you have a set back, it is not a failure, it is an opportunity to try again! Screening Testing Mammogram Every 1 -2 years based on history and risk factors Starting at age 5 Pap Smear Ages 21-39 every 3 years Ages 82-65 every 5 years with HPV testing More frequent testing may be required based on results and history Colon Cancer  Screening Every 1-10 years based on test performed, risk factors, and history Starting at age 2 Bone Density Screening Every 2-10 years based on history Starting at age 61 for women Recommendations for men differ based on medication usage, history, and risk factors AAA Screening One time ultrasound Men 9-37 years old who have every smoked Lung Cancer Screening Low Dose Lung CT every 12 months Age 23-80 years with a 30 pack-year smoking history who still smoke or who have quit within the last 15 years   Screening Labs Routine  Labs: Complete Blood Count (CBC), Complete Metabolic Panel (CMP), Cholesterol (Lipid Panel) Every 6-12 months based on history and medications May be recommended more frequently based on current conditions or previous results Hemoglobin A1c Lab Every 3-12 months based on history and previous results Starting at age 44 or earlier with diagnosis of diabetes, high cholesterol, BMI >26, and/or risk factors Frequent monitoring for patients with diabetes to ensure  blood sugar control Thyroid Panel (TSH) Every 6 months based on history, symptoms, and risk factors May be repeated more often if on medication HIV One time testing for all patients 21 and older May be repeated more frequently for patients with increased risk factors or exposure Hepatitis C One time testing for all patients 41 and older May be repeated more frequently for patients with increased risk factors or exposure Gonorrhea, Chlamydia Every 12 months for all sexually active persons 13-24 years Additional monitoring may be recommended for those who are considered high risk or who have symptoms Every 12 months for any woman on birth control, regardless of sexual activity PSA Men 64-35 years old with risk factors Additional screening may be recommended from age 48-69 based on risk factors, symptoms, and history  Vaccine Recommendations Tetanus Booster All adults every 10 years Flu Vaccine All patients 6 months and older every year COVID Vaccine All patients 12 years and older Initial dosing with booster May recommend additional booster based on age and health history HPV Vaccine 2 doses all patients age 47-26 Dosing may be considered for patients over 26 Shingles Vaccine (Shingrix) 2 doses all adults 55 years and older Pneumonia (Pneumovax 56) All adults 65 years and older May recommend earlier dosing based on health history One year apart from Prevnar 51 Pneumonia (Prevnar 85) All adults 65 years and older Dosed 1 year after Pneumovax 23 Pneumonia (Prevnar 20) One time alternative to the two dosing of 13 and 23 For all adults with initial dose of 23, 20 is recommended 1 year later For all adults with initial dose of 13, 23 is still recommended as second option 1 year later

## 2023-06-25 LAB — LIPID PANEL
Chol/HDL Ratio: 4 ratio (ref 0.0–4.4)
Cholesterol, Total: 224 mg/dL — ABNORMAL HIGH (ref 100–199)
HDL: 56 mg/dL (ref >39)
LDL Chol Calc (NIH): 148 mg/dL — ABNORMAL HIGH (ref 0–99)
Triglycerides: 113 mg/dL (ref 0–149)
VLDL Cholesterol Cal: 20 mg/dL (ref 5–40)

## 2023-06-25 LAB — CMP14+EGFR
ALT: 17 IU/L (ref 0–32)
AST: 28 IU/L (ref 0–40)
Albumin: 4.4 g/dL (ref 3.9–4.9)
Alkaline Phosphatase: 74 IU/L (ref 44–121)
BUN/Creatinine Ratio: 17 (ref 12–28)
BUN: 15 mg/dL (ref 8–27)
Bilirubin Total: 0.2 mg/dL (ref 0.0–1.2)
CO2: 24 mmol/L (ref 20–29)
Calcium: 10 mg/dL (ref 8.7–10.3)
Chloride: 103 mmol/L (ref 96–106)
Creatinine, Ser: 0.86 mg/dL (ref 0.57–1.00)
Globulin, Total: 2.8 g/dL (ref 1.5–4.5)
Glucose: 82 mg/dL (ref 70–99)
Potassium: 4.2 mmol/L (ref 3.5–5.2)
Sodium: 141 mmol/L (ref 134–144)
Total Protein: 7.2 g/dL (ref 6.0–8.5)
eGFR: 77 mL/min/{1.73_m2} (ref >59)

## 2023-06-25 LAB — CBC WITH DIFFERENTIAL/PLATELET
Basophils Absolute: 0.1 10*3/uL (ref 0.0–0.2)
Basos: 1 %
EOS (ABSOLUTE): 0.2 10*3/uL (ref 0.0–0.4)
Eos: 2 %
Hematocrit: 42.2 % (ref 34.0–46.6)
Hemoglobin: 13.7 g/dL (ref 11.1–15.9)
Immature Grans (Abs): 0 10*3/uL (ref 0.0–0.1)
Immature Granulocytes: 0 %
Lymphocytes Absolute: 1.6 10*3/uL (ref 0.7–3.1)
Lymphs: 21 %
MCH: 29.1 pg (ref 26.6–33.0)
MCHC: 32.5 g/dL (ref 31.5–35.7)
MCV: 90 fL (ref 79–97)
Monocytes Absolute: 0.5 10*3/uL (ref 0.1–0.9)
Monocytes: 7 %
Neutrophils Absolute: 5.2 10*3/uL (ref 1.4–7.0)
Neutrophils: 69 %
Platelets: 274 10*3/uL (ref 150–450)
RBC: 4.7 x10E6/uL (ref 3.77–5.28)
RDW: 12.1 % (ref 11.7–15.4)
WBC: 7.6 10*3/uL (ref 3.4–10.8)

## 2023-06-25 LAB — HEMOGLOBIN A1C
Est. average glucose Bld gHb Est-mCnc: 103 mg/dL
Hgb A1c MFr Bld: 5.2 % (ref 4.8–5.6)

## 2023-06-25 LAB — TSH: TSH: 1.69 u[IU]/mL (ref 0.450–4.500)

## 2023-06-26 DIAGNOSIS — Z0000 Encounter for general adult medical examination without abnormal findings: Secondary | ICD-10-CM | POA: Insufficient documentation

## 2023-06-26 NOTE — Assessment & Plan Note (Signed)

## 2023-06-26 NOTE — Assessment & Plan Note (Signed)
Chronic. Well controlled. No concerns at this time.

## 2023-06-26 NOTE — Assessment & Plan Note (Addendum)
Chronic. Recent lcardiac CT score of zero. Lipids slightly elevated. She is working on diet and exercise. She would like to stop the statin today, if possible, as she does not wish to take a daily medication. Given the current guidelines with cardiac calcium score of zero in low risk individuals, it is appropriate to hold the statin. We will monitor labs and recheck cardiac CT in 5 years. If changes are present, we can consider restarting.

## 2023-06-26 NOTE — Assessment & Plan Note (Addendum)
Chronic. Recommend weight training activities and supplementation of calcium and vitamin D. Continue Boniva. Will monitor labs today. No acute findings.

## 2023-07-01 ENCOUNTER — Encounter: Payer: Self-pay | Admitting: Nurse Practitioner

## 2023-08-27 ENCOUNTER — Other Ambulatory Visit: Payer: Self-pay

## 2023-08-27 DIAGNOSIS — J309 Allergic rhinitis, unspecified: Secondary | ICD-10-CM

## 2023-08-27 MED ORDER — MONTELUKAST SODIUM 10 MG PO TABS: 10.0000 mg | ORAL_TABLET | Freq: Every day | ORAL | 0 refills | Status: DC

## 2023-09-23 ENCOUNTER — Other Ambulatory Visit: Payer: Self-pay | Admitting: Obstetrics and Gynecology

## 2023-09-23 DIAGNOSIS — R928 Other abnormal and inconclusive findings on diagnostic imaging of breast: Secondary | ICD-10-CM

## 2023-10-09 DIAGNOSIS — C50919 Malignant neoplasm of unspecified site of unspecified female breast: Secondary | ICD-10-CM

## 2023-10-09 HISTORY — DX: Malignant neoplasm of unspecified site of unspecified female breast: C50.919

## 2023-10-16 ENCOUNTER — Other Ambulatory Visit: Payer: Self-pay | Admitting: Obstetrics and Gynecology

## 2023-10-16 ENCOUNTER — Ambulatory Visit
Admission: RE | Admit: 2023-10-16 | Discharge: 2023-10-16 | Disposition: A | Discharge status: Home / Self Care | Payer: Managed Care, Other (non HMO) | Source: Ambulatory Visit | Attending: Obstetrics and Gynecology | Admitting: Obstetrics and Gynecology

## 2023-10-16 DIAGNOSIS — R928 Other abnormal and inconclusive findings on diagnostic imaging of breast: Secondary | ICD-10-CM

## 2023-10-16 DIAGNOSIS — N6320 Unspecified lump in the left breast, unspecified quadrant: Secondary | ICD-10-CM

## 2023-10-18 ENCOUNTER — Other Ambulatory Visit: Payer: Self-pay | Admitting: Diagnostic Radiology

## 2023-10-18 ENCOUNTER — Ambulatory Visit
Admission: RE | Admit: 2023-10-18 | Discharge: 2023-10-18 | Disposition: A | Discharge status: Home / Self Care | Payer: Managed Care, Other (non HMO) | Source: Ambulatory Visit | Attending: Obstetrics and Gynecology | Admitting: Obstetrics and Gynecology

## 2023-10-18 ENCOUNTER — Other Ambulatory Visit (HOSPITAL_COMMUNITY): Payer: Self-pay | Admitting: Diagnostic Radiology

## 2023-10-18 DIAGNOSIS — N6320 Unspecified lump in the left breast, unspecified quadrant: Secondary | ICD-10-CM

## 2023-10-18 DIAGNOSIS — R928 Other abnormal and inconclusive findings on diagnostic imaging of breast: Secondary | ICD-10-CM

## 2023-10-18 HISTORY — PX: BREAST BIOPSY: SHX20

## 2023-10-21 LAB — SURGICAL PATHOLOGY

## 2023-10-22 ENCOUNTER — Telehealth: Payer: Self-pay | Admitting: *Deleted

## 2023-10-22 NOTE — Telephone Encounter (Signed)
 LVM with appointment time, told her I would email her all the information and left my call back with any questions

## 2023-10-24 ENCOUNTER — Telehealth: Payer: Self-pay | Admitting: *Deleted

## 2023-10-24 ENCOUNTER — Encounter: Payer: Self-pay | Admitting: *Deleted

## 2023-10-24 DIAGNOSIS — C50412 Malignant neoplasm of upper-outer quadrant of left female breast: Secondary | ICD-10-CM | POA: Insufficient documentation

## 2023-10-24 NOTE — Telephone Encounter (Signed)
Still no answer from patient went straight to voicemail, did send packet on the 14th, and left another message to call if she had any questions about upcoming [facility] appointment with arrival time of 1230

## 2023-10-30 ENCOUNTER — Encounter: Payer: Self-pay | Admitting: Hematology

## 2023-10-30 ENCOUNTER — Encounter: Payer: Self-pay | Admitting: *Deleted

## 2023-10-30 ENCOUNTER — Inpatient Hospital Stay: Payer: Managed Care, Other (non HMO) | Attending: Hematology | Admitting: Hematology

## 2023-10-30 ENCOUNTER — Inpatient Hospital Stay: Payer: Managed Care, Other (non HMO)

## 2023-10-30 ENCOUNTER — Encounter: Payer: Self-pay | Admitting: Physical Therapy

## 2023-10-30 ENCOUNTER — Other Ambulatory Visit: Payer: Self-pay

## 2023-10-30 ENCOUNTER — Ambulatory Visit: Payer: Managed Care, Other (non HMO) | Attending: General Surgery | Admitting: Physical Therapy

## 2023-10-30 ENCOUNTER — Ambulatory Visit
Admission: RE | Admit: 2023-10-30 | Discharge: 2023-10-30 | Disposition: A | Discharge status: Home / Self Care | Payer: Managed Care, Other (non HMO) | Source: Ambulatory Visit | Attending: Radiation Oncology | Admitting: Radiation Oncology

## 2023-10-30 ENCOUNTER — Other Ambulatory Visit: Payer: Self-pay | Admitting: General Surgery

## 2023-10-30 VITALS — BP 136/63 | HR 65 | Temp 98.1°F | Resp 18 | Ht 63.25 in | Wt 143.5 lb

## 2023-10-30 DIAGNOSIS — Z1721 Progesterone receptor positive status: Secondary | ICD-10-CM | POA: Insufficient documentation

## 2023-10-30 DIAGNOSIS — G8929 Other chronic pain: Secondary | ICD-10-CM | POA: Insufficient documentation

## 2023-10-30 DIAGNOSIS — Z170 Estrogen receptor positive status [ER+]: Secondary | ICD-10-CM

## 2023-10-30 DIAGNOSIS — M8580 Other specified disorders of bone density and structure, unspecified site: Secondary | ICD-10-CM | POA: Insufficient documentation

## 2023-10-30 DIAGNOSIS — R293 Abnormal posture: Secondary | ICD-10-CM | POA: Insufficient documentation

## 2023-10-30 DIAGNOSIS — G43909 Migraine, unspecified, not intractable, without status migrainosus: Secondary | ICD-10-CM | POA: Insufficient documentation

## 2023-10-30 DIAGNOSIS — K589 Irritable bowel syndrome without diarrhea: Secondary | ICD-10-CM | POA: Diagnosis not present

## 2023-10-30 DIAGNOSIS — Z1732 Human epidermal growth factor receptor 2 negative status: Secondary | ICD-10-CM | POA: Insufficient documentation

## 2023-10-30 DIAGNOSIS — C50412 Malignant neoplasm of upper-outer quadrant of left female breast: Secondary | ICD-10-CM | POA: Insufficient documentation

## 2023-10-30 DIAGNOSIS — Z803 Family history of malignant neoplasm of breast: Secondary | ICD-10-CM | POA: Insufficient documentation

## 2023-10-30 LAB — CMP (CANCER CENTER ONLY)
ALT: 9 U/L (ref 0–44)
AST: 17 U/L (ref 15–41)
Albumin: 4.1 g/dL (ref 3.5–5.0)
Alkaline Phosphatase: 62 U/L (ref 38–126)
Anion gap: 4 — ABNORMAL LOW (ref 5–15)
BUN: 19 mg/dL (ref 8–23)
CO2: 30 mmol/L (ref 22–32)
Calcium: 9.5 mg/dL (ref 8.9–10.3)
Chloride: 106 mmol/L (ref 98–111)
Creatinine: 0.84 mg/dL (ref 0.44–1.00)
GFR, Estimated: >60 mL/min (ref >60)
Glucose, Bld: 99 mg/dL (ref 70–99)
Potassium: 3.7 mmol/L (ref 3.5–5.1)
Sodium: 140 mmol/L (ref 135–145)
Total Bilirubin: 0.3 mg/dL (ref 0.0–1.2)
Total Protein: 7.2 g/dL (ref 6.5–8.1)

## 2023-10-30 LAB — CBC WITH DIFFERENTIAL (CANCER CENTER ONLY)
Abs Immature Granulocytes: 0.02 10*3/uL (ref 0.00–0.07)
Basophils Absolute: 0.1 10*3/uL (ref 0.0–0.1)
Basophils Relative: 1 %
Eosinophils Absolute: 0.1 10*3/uL (ref 0.0–0.5)
Eosinophils Relative: 3 %
HCT: 40.1 % (ref 36.0–46.0)
Hemoglobin: 12.8 g/dL (ref 12.0–15.0)
Immature Granulocytes: 1 %
Lymphocytes Relative: 27 %
Lymphs Abs: 1.1 10*3/uL (ref 0.7–4.0)
MCH: 28.4 pg (ref 26.0–34.0)
MCHC: 31.9 g/dL (ref 30.0–36.0)
MCV: 89.1 fL (ref 80.0–100.0)
Monocytes Absolute: 0.3 10*3/uL (ref 0.1–1.0)
Monocytes Relative: 7 %
Neutro Abs: 2.6 10*3/uL (ref 1.7–7.7)
Neutrophils Relative %: 61 %
Platelet Count: 247 10*3/uL (ref 150–400)
RBC: 4.5 MIL/uL (ref 3.87–5.11)
RDW: 12.1 % (ref 11.5–15.5)
WBC Count: 4.2 10*3/uL (ref 4.0–10.5)
nRBC: 0 % (ref 0.0–0.2)

## 2023-10-30 LAB — GENETIC SCREENING ORDER

## 2023-10-30 NOTE — Research (Signed)
[employer] 2021-05 - Specimen Collection Study to Evaluate Biomarkers in Subjects with Cancer    Patient Tamara Lutz was identified by Dr. Mosetta Putt as a potential candidate for the above listed study.  This Clinical Research Nurse met with Tamara Lutz, [id number], on 10/30/23 in a manner and location that ensures patient privacy to discuss participation in the above listed research study.  Patient is Accompanied by her husband .  A copy of the informed consent document with embedded HIPAA language was provided to the patient.  Patient reads, speaks, and understands [place].    Patient was provided with the business card of this Nurse and encouraged to contact the research team with any questions.  Patient was provided the option of taking informed consent documents home to review and was encouraged to review at their convenience with their support network, including other care providers. Patient is comfortable with making a decision regarding study participation today.  As outlined in the informed consent form, this Nurse and Tamara Lutz discussed the purpose of the research study, the investigational nature of the study, study procedures and requirements for study participation, potential risks and benefits of study participation, as well as alternatives to participation. This study is not blinded. The patient understands participation is voluntary and they may withdraw from study participation at any time.  This study does not involve randomization.  This study does not involve an investigational drug or device. This study does not involve a placebo. Patient understands enrollment is pending full eligibility review.   Confidentiality and how the patient's information will be used as part of study participation were discussed.  Patient was informed there is reimbursement provided for their time and effort spent on trial participation.  The patient is encouraged to discuss  research study participation with their insurance provider to determine what costs they may incur as part of study participation, including research related injury.    All questions were answered to patient's satisfaction.  The informed consent with embedded HIPAA language was reviewed page by page.  The patient's mental and emotional status is appropriate to provide informed consent, and the patient verbalizes an understanding of study participation.  Patient has agreed to participate in the above listed research study and has voluntarily signed the informed consent approved version [14Jan2022], revised 30Jan2023 with embedded HIPAA language, version 14Jan2022 revised 30Jan2023  on 10/30/23 at 2:00PM. The nurse began reviewing eligibility with the patient.  The pt was asked if she has been diagnosed with cancer in the past 5 years.  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  The pt was informed that unfortunately she will not be able to enroll in this study based on having another cancer in the past 5 years.  The pt verbalized understanding.  The pt was thanked for her time and interest.    Janan Ridge RN, BSN, CCRP Clinical Research Nurse Lead 10/30/2023 3:16 PM

## 2023-10-30 NOTE — Therapy (Signed)
OUTPATIENT PHYSICAL THERAPY BREAST CANCER BASELINE EVALUATION   Patient Name: Tamara Lutz MRN: [id number] DOB:Feb 05, 1961, 63 y.o., female Today's Date: 10/30/2023  END OF SESSION:  PT End of Session - 10/30/23 1547     Visit Number 1    Number of Visits 2    Date for PT Re-Evaluation 12/25/23    PT Start Time 1448    PT Stop Time 1525    PT Time Calculation (min) 37 min    Activity Tolerance Patient tolerated treatment well    Behavior During Therapy [facility] for tasks assessed/performed             Past Medical History:  Diagnosis Date   Allergy    Basal cell carcinoma 01-23-1962   nod- upper right chest(CX35FU)   Hyperlipidemia    Osteopenia    Past Surgical History:  Procedure Laterality Date   BREAST BIOPSY Left 10/18/2023   [place] LT BREAST BX W LOC DEV 1ST LESION IMG BX SPEC US GUIDE 10/18/2023 [facility]   CESAREAN SECTION     CESAREAN SECTION N/A    [employer] 07/31/2020   COLONOSCOPY  2019   Dr.Gupta   DILATION AND CURETTAGE OF UTERUS     FLEXIBLE SIGMOIDOSCOPY N/A 08/11/2021   Procedure: FLEXIBLE SIGMOIDOSCOPY;  Surgeon: Jeani Hawking, MD;  Location: [facility];  Service: Endoscopy;  Laterality: N/A;   HEMOSTASIS CLIP PLACEMENT  08/11/2021   Procedure: HEMOSTASIS CLIP PLACEMENT;  Surgeon: Jeani Hawking, MD;  Location: [facility];  Service: Endoscopy;;   MOUTH SURGERY     POLYPECTOMY     SCLEROTHERAPY  08/11/2021   Procedure: Susa Day;  Surgeon: Jeani Hawking, MD;  Location: [facility];  Service: Endoscopy;;   Patient Active Problem List   Diagnosis Date Noted   Malignant neoplasm of upper-outer quadrant of left breast in female, estrogen receptor positive (HCC) 10/24/2023   Encounter for annual physical exam 06/26/2023   H/O colonoscopy with polypectomy    Insomnia 05/24/2021   Osteoporosis 06/02/2019   Migraine without aura 06/02/2019   Hyperlipidemia 06/02/2019   Irritable bowel syndrome 06/02/2019    REFERRING PROVIDER: Dr.  Emelia Loron  REFERRING DIAG: Left breast cancer  THERAPY DIAG:  Malignant neoplasm of upper-outer quadrant of left breast in female, estrogen receptor positive (HCC)  Abnormal posture  Rationale for Evaluation and Treatment: Rehabilitation  ONSET DATE: 10/18/2023  SUBJECTIVE:                                                                                                                                                                                           SUBJECTIVE STATEMENT: Patient reports she is here  today to be seen by her medical team for her newly diagnosed left breast cancer.   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 It measures 9 mm and is located in the upper outer quadrant. It is ER/PR positive and HER2 negative with a Ki67 of 10%. 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  PATIENT GOALS:   reduce lymphedema risk and learn post op HEP.   PAIN:  Are you having pain? No  PRECAUTIONS: Active CA   RED FLAGS: None   HAND DOMINANCE: right  WEIGHT BEARING RESTRICTIONS: No  FALLS:  Has patient fallen in last 6 months? No  LIVING ENVIRONMENT: Patient lives with: her husband and dog Lives in: House/apartment Has following equipment at home: None  OCCUPATION: Careers adviser  LEISURE: She does yoga 1-2x/week, uses her stepper 12 min/day, and walks/dances some  PRIOR LEVEL OF FUNCTION: Independent   OBJECTIVE: Note: Objective measures were completed at Evaluation unless otherwise noted.  COGNITION: Overall cognitive status: Within functional limits for tasks assessed    POSTURE:  Forward head and rounded shoulders posture  UPPER EXTREMITY AROM/PROM:  A/PROM RIGHT   eval   Shoulder extension 50  Shoulder flexion 160  Shoulder abduction 164  Shoulder internal rotation 69  Shoulder external rotation 90    (Blank rows = not tested)  A/PROM  LEFT   eval  Shoulder extension 50  Shoulder flexion 151  Shoulder abduction 160  Shoulder internal rotation 63  Shoulder external rotation 90    (Blank rows = not tested)  CERVICAL AROM: All within normal limits  UPPER EXTREMITY STRENGTH: WNL  LYMPHEDEMA ASSESSMENTS (in cm):   LANDMARK RIGHT   eval  10 cm proximal to olecranon process 27  Olecranon process 22.4  10 cm proximal to ulnar styloid process 19.3  Just proximal to ulnar styloid process 13.2  Across hand at thumb web space 17.2  At base of 2nd digit 6  (Blank rows = not tested)  LANDMARK LEFT   eval  10 cm proximal to olecranon process 25.9  Olecranon process 22  10 cm proximal to ulnar styloid process 18  Just proximal to ulnar styloid process 12.8  Across hand at thumb web space 17.2  At base of 2nd digit 6  (Blank rows = not tested)  L-DEX LYMPHEDEMA SCREENING:  The patient was assessed using the L-Dex machine today to produce a lymphedema index baseline score. The patient will be reassessed on a regular basis (typically every 3 months) to obtain new L-Dex scores. If the score is > 6.5 points away from his/her baseline score indicating onset of subclinical lymphedema, it will be recommended to wear a compression garment for 4 weeks, 12 hours per day and then be reassessed. If the score continues to be > 6.5 points from baseline at reassessment, we will initiate lymphedema treatment. Assessing in this manner has a 95% rate of preventing clinically significant lymphedema.   L-DEX FLOWSHEETS - 10/30/23 1600       L-DEX LYMPHEDEMA SCREENING   Measurement Type Unilateral    L-DEX MEASUREMENT EXTREMITY Upper Extremity    POSITION  Standing    DOMINANT SIDE Right    At Risk Side Left    BASELINE SCORE (UNILATERAL) 0             QUICK DASH SURVEY:  Neldon Mc - 10/30/23 0001     Open a tight or  new jar Moderate difficulty    Do heavy household chores (wash walls, wash floors) No difficulty    Carry a  shopping bag or briefcase No difficulty    Wash your back No difficulty    Use a knife to cut food No difficulty    Recreational activities in which you take some force or impact through your arm, shoulder, or hand (golf, hammering, tennis) No difficulty    During the past week, to what extent has your arm, shoulder or hand problem interfered with your normal social activities with family, friends, neighbors, or groups? Not at all    During the past week, to what extent has your arm, shoulder or hand problem limited your work or other regular daily activities Not at all    Arm, shoulder, or hand pain. None    Tingling (pins and needles) in your arm, shoulder, or hand Mild    Difficulty Sleeping Mild difficulty    DASH Score 9.09 %              PATIENT EDUCATION:  Education details: Lymphedema risk reduction and post op shoulder/posture HEP Person educated: Patient Education method: Explanation, Demonstration, Handout Education comprehension: Patient verbalized understanding and returned demonstration  HOME EXERCISE PROGRAM: Patient was instructed today in a home exercise program today for post op shoulder range of motion. These included active assist shoulder flexion in sitting, scapular retraction, wall walking with shoulder abduction, and hands behind head external rotation.  She was encouraged to do these twice a day, holding 3 seconds and repeating 5 times when permitted by her physician.   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 It measures 9 mm and is located in the upper outer quadrant. It is ER/PR positive and HER2 negative with a Ki67 of 10%. She had melanoma removed from her chin in 2023.Her multidisciplinary medical team met prior to her assessments to determine a recommended treatment plan. She is planning to have a left lumpectomy and sentinel node biopsy followed by Oncotype  testing, radiation, and anti-estrogen therapy. She will benefit from a post op PT reassessment to determine needs and from L-Dex screens every 3 months for 2 years to detect subclinical lymphedema.  Pt will benefit from skilled therapeutic intervention to improve on the following deficits: Decreased knowledge of precautions, impaired UE functional use, pain, decreased ROM, postural dysfunction.   PT treatment/interventions: ADL/self-care home management, pt/family education, therapeutic exercise  REHAB POTENTIAL: Excellent  CLINICAL DECISION MAKING: Stable/uncomplicated  EVALUATION COMPLEXITY: Low   GOALS: Goals reviewed with patient? YES  LONG TERM GOALS: (STG=LTG)    Name Target Date Goal status  1 Pt will be able to verbalize understanding of pertinent lymphedema risk reduction practices relevant to her dx specifically related to skin care.  Baseline:  No knowledge 10/30/2023 Achieved at eval  2 Pt will be able to return demo and/or verbalize understanding of the post op HEP related to regaining shoulder ROM. Baseline:  No knowledge 10/30/2023 Achieved at eval  3 Pt will be able to verbalize understanding of the importance of attending the post op After Breast CA Class for further lymphedema risk reduction education and therapeutic exercise.  Baseline:  No knowledge 10/30/2023 Achieved at eval  4 Pt will demo she has regained full shoulder ROM and function post operatively compared to baselines.  Baseline: See objective measurements taken today. 12/25/2023     PLAN:  PT FREQUENCY/DURATION: EVAL  and 1 follow up appointment.   PLAN FOR NEXT SESSION: will reassess 3-4 weeks post op to determine needs.   Patient will follow up at outpatient cancer rehab 3-4 weeks following surgery.  If the patient requires physical therapy at that time, a specific plan will be dictated and sent to the referring physician for approval. The patient was educated today on appropriate basic range of motion  exercises to begin post operatively and the importance of attending the After Breast Cancer class following surgery.  Patient was educated today on lymphedema risk reduction practices as it pertains to recommendations that will benefit the patient immediately following surgery.  She verbalized good understanding.    Physical Therapy Information for After Breast Cancer Surgery/Treatment:  Lymphedema is a swelling condition that you may be at risk for in your arm if you have lymph nodes removed from the armpit area.  After a sentinel node biopsy, the risk is approximately 5-9% and is higher after an axillary node dissection.  There is treatment available for this condition and it is not life-threatening.  Contact your physician or physical therapist with concerns. You may begin the 4 shoulder/posture exercises (see additional sheet) when permitted by your physician (typically a week after surgery).  If you have drains, you may need to wait until those are removed before beginning range of motion exercises.  A general recommendation is to not lift your arms above shoulder height until drains are removed.  These exercises should be done to your tolerance and gently.  This is not a "no pain/no gain" type of recovery so listen to your body and stretch into the range of motion that you can tolerate, stopping if you have pain.  If you are having immediate reconstruction, ask your plastic surgeon about doing exercises as he or she may want you to wait. We encourage you to attend the free one time ABC (After Breast Cancer) class offered by [facility].  You will learn information related to lymphedema risk, prevention and treatment and additional exercises to regain mobility following surgery.  You can call [phone] for more information.  This is offered the 1st and 3rd Monday of each month.  You only attend the class one time. While undergoing any medical procedure or treatment, try to  avoid blood pressure being taken or needle sticks from occurring on the arm on the side of cancer.   This recommendation begins after surgery and continues for the rest of your life.  This may help reduce your risk of getting lymphedema (swelling in your arm). An excellent resource for those seeking information on lymphedema is the National Lymphedema Network's web site. It can be accessed at www.lymphnet.org If you notice swelling in your hand, arm or breast at any time following surgery (even if it is many years from now), please contact your doctor or physical therapist to discuss this.  Lymphedema can be treated at any time but it is easier for you if it is treated early on.  If you feel like your shoulder motion is not returning to normal in a reasonable amount of time, please contact your surgeon or physical therapist.  [facility] [phone]. [address], [place] [address]  ABC CLASS After Breast Cancer Class  After Breast Cancer Class is a specially designed exercise class to assist you in a safe recover after having breast cancer surgery.  In this class you will learn how to get back  to full function whether your drains were just removed or if you had surgery a month ago.  This one-time class is held the 1st and 3rd Monday of every month from 11:00 a.m. until 12:00 noon virtually.  This class is FREE and space is limited. For more information or to register for the next available class, call [phone].  Class Goals  Understand specific stretches to improve the flexibility of you chest and shoulder. Learn ways to safely strengthen your upper body and improve your posture. Understand the warning signs of infection and why you may be at risk for an arm infection. Learn about Lymphedema and prevention.  ** You do not attend this class until after surgery.  Drains must be removed to participate  Patient was instructed today in a home  exercise program today for post op shoulder range of motion. These included active assist shoulder flexion in sitting, scapular retraction, wall walking with shoulder abduction, and hands behind head external rotation.  She was encouraged to do these twice a day, holding 3 seconds and repeating 5 times when permitted by her physician.  Bethann Punches, [place] 10/30/23 4:08 PM

## 2023-10-30 NOTE — Progress Notes (Signed)
[facility]   [phone]) [phone] [phone]   Clinic New Consult Note   Patient Care Team: Early, Sung Amabile, NP as PCP - General (Nurse Practitioner) Orbie Pyo, MD as PCP - Cardiology (Cardiology) Drema Dallas, DO as Consulting Physician (Neurology) Pershing Proud, RN as Oncology Nurse Navigator Donnelly Angelica, RN as Oncology Nurse Navigator 10/30/2023  CHIEF COMPLAINTS/PURPOSE OF CONSULTATION:  Newly diagnosed left breast cancer  REFERRING PHYSICIAN: Breast center   Discussed the use of AI scribe software for clinical note transcription with the patient, who gave verbal consent to proceed.  History of Present Illness   The patient, with a history of osteopenia, high cholesterol, skin cancer, migraines, and IBS, presents for a new consult due to recent diagnosis of breast cancer. The cancer was discovered during a routine mammogram, and the patient did not notice any lumps or abnormalities in the breast. The patient has a family history of breast and uterine cancer.  Her diagnostic mammogram and ultrasound on October 16, 2023 showed 9 mm mass in the left breast at the 3 o'clock position 2 cm from nipple, suspicious for malignancy.  Ultrasound of axillary was negative.  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  She also has DCIS.  The patient also reports experiencing symptoms of IBS, primarily constipation and sharp pains. These symptoms are reportedly stress-related. The patient also has a history of migraines, which have been managed with Rizatriptan.  The patient has osteopenia and has been taking Boniva once a month. The patient also reports experiencing hot flashes and difficulty sleeping, which they attribute to menopause. The patient has been managing these symptoms with over-the-counter sleep aids and melatonin.  The patient  also reports a history of a C-section and a hemorrhage following a colonoscopy. The patient denies any current pain or discomfort.       MEDICAL HISTORY:  Past Medical History:  Diagnosis Date   Allergy    Basal cell carcinoma 05/01/62   nod- upper right chest(CX35FU)   Hyperlipidemia    Osteopenia     SURGICAL HISTORY: Past Surgical History:  Procedure Laterality Date   BREAST BIOPSY Left 10/18/2023   [place] LT BREAST BX W LOC DEV 1ST LESION IMG BX SPEC US GUIDE 10/18/2023 [facility]MO[facility]AP[facility]   CESAREAN SECTION     CESAREAN SECTION N/A    [employer] 07/31/2020   COLONOSCOPY  2019   Dr.Gupta   DILATION AND CURETTAGE OF UTERUS     FLEXIBLE SIGMOIDOSCOPY N/A 08/11/2021   Procedure: FLEXIBLE SIGMOIDOSCOPY;  Surgeon: Jeani Hawking, MD;  Location: [facility];  Service: Endoscopy;  Laterality: N/A;   HEMOSTASIS CLIP PLACEMENT  08/11/2021   Procedure: HEMOSTASIS CLIP PLACEMENT;  Surgeon: Jeani Hawking, MD;  Location: [facility];  Service: Endoscopy;;   MOUTH SURGERY     POLYPECTOMY     SCLEROTHERAPY  08/11/2021   Procedure: Susa Day;  Surgeon: Jeani Hawking, MD;  Location: [facility];  Service: Endoscopy;;    SOCIAL HISTORY: Social History   Socioeconomic History   Marital status: Married    Spouse name: Not on file   Number of children: 5   Years of education: 16   Highest education level: Not on file  Occupational History   Not on file  Tobacco Use   Smoking status: Never    Passive exposure: Never   Smokeless tobacco: Never  Vaping  Use   Vaping status: Never Used  Substance and Sexual Activity   Alcohol use: Yes    Comment: occasionally-2 per month   Drug use: No   Sexual activity: Yes  Other Topics Concern   Not on file  Social History Narrative   Right handed   One story home   Drinks caffeine    Social Drivers of Corporate investment banker Strain: Not on file  Food Insecurity: Not on file  Transportation Needs: Not on file  Physical Activity: Not  on file  Stress: Not on file  Social Connections: Not on file  Intimate Partner Violence: Not on file    FAMILY HISTORY: Family History  Problem Relation Age of Onset   Multiple sclerosis Mother    Uterine cancer Mother    Alzheimer's disease Father    Cancer Sister 64       Breast cancer   Breast cancer Sister 44   Diabetes Neg Hx    Heart disease Neg Hx    Hypertension Neg Hx    Colon cancer Neg Hx    Esophageal cancer Neg Hx    Rectal cancer Neg Hx    Stomach cancer Neg Hx     ALLERGIES:  is allergic to gabapentin.  MEDICATIONS:  Current Outpatient Medications  Medication Sig Dispense Refill   cetirizine (ZYRTEC) 10 MG chewable tablet Chew 10 mg by mouth daily.     fluticasone (FLONASE) 50 MCG/ACT nasal spray Place 2 sprays into both nostrils daily. 16 g 6   ibandronate (BONIVA) 150 MG tablet Take 150 mg by mouth every 30 (thirty) days. Take in the morning with a full glass of water, on an empty stomach, and do not take anything else by mouth or lie down for the next 30 min.     Magnesium 500 MG CAPS Take by mouth.     montelukast (SINGULAIR) 10 MG tablet Take 1 tablet (10 mg total) by mouth at bedtime. 90 tablet 0   Multiple Vitamin (MULTIVITAMIN) tablet Take 1 tablet by mouth daily.     rizatriptan (MAXALT) 10 MG tablet Take 10 mg (1 tablet total) by mouth at the start of the headache. May repeat in 2 hours x 1 if headache persists. Max of 2 tablets/24 hours. 12 tablet 6   Calcium Carb-Cholecalciferol 600-400 MG-UNIT CAPS Take 1 capsule by mouth. (Patient not taking: Reported on 10/30/2023)     Cholecalciferol (VITAMIN D) 50 MCG (2000 UT) CAPS Take by mouth. (Patient not taking: Reported on 10/30/2023)     No current facility-administered medications for this visit.    REVIEW OF SYSTEMS:   Constitutional: Denies fevers, chills or abnormal night sweats Eyes: Denies blurriness of vision, double vision or watery eyes Ears, nose, mouth, throat, and face: Denies mucositis  or sore throat Respiratory: Denies cough, dyspnea or wheezes Cardiovascular: Denies palpitation, chest discomfort or lower extremity swelling Gastrointestinal:  Denies nausea, heartburn or change in bowel habits Skin: Denies abnormal skin rashes Lymphatics: Denies new lymphadenopathy or easy bruising Neurological:Denies numbness, tingling or new weaknesses Behavioral/Psych: Mood is stable, no new changes  All other systems were reviewed with the patient and are negative.  PHYSICAL EXAMINATION: ECOG PERFORMANCE STATUS: 0 - Asymptomatic  Vitals:   10/30/23 1251  BP: 136/63  Pulse: 65  Resp: 18  Temp: 98.1 F (36.7 C)  SpO2: 100%   Filed Weights   10/30/23 1251  Weight: 143 lb 8 oz (65.1 kg)    GENERAL:alert, no distress  and comfortable SKIN: skin color, texture, turgor are normal, no rashes or significant lesions EYES: normal, conjunctiva are pink and non-injected, sclera clear OROPHARYNX:no exudate, no erythema and lips, buccal mucosa, and tongue normal  NECK: supple, thyroid normal size, non-tender, without nodularity LYMPH:  no palpable lymphadenopathy in the cervical, axillary or inguinal LUNGS: clear to auscultation and percussion with normal breathing effort HEART: regular rate & rhythm and no murmurs and no lower extremity edema ABDOMEN:abdomen soft, non-tender and normal bowel sounds Musculoskeletal:no cyanosis of digits and no clubbing  PSYCH: alert & oriented x 3 with fluent speech NEURO: no focal motor/sensory deficits  Physical Exam   BREAST: Left breast shows bruising, no palpable hematoma or larger lumps noted. ABDOMEN: No tenderness on palpation. SKIN: Superficial bruising on left breast.      LABORATORY DATA:  I have reviewed the data as listed    Latest Ref Rng & Units 10/30/2023   12:19 PM 06/24/2023    4:15 PM 06/19/2022    9:04 AM  CBC  WBC 4.0 - 10.5 K/uL 4.2  7.6  4.1   Hemoglobin 12.0 - 15.0 g/dL 45.4  09.8  11.9   Hematocrit 36.0 - 46.0 %  40.1  42.2  39.9   Platelets 150 - 400 K/uL 247  274  227     @cmpl @  RADIOGRAPHIC STUDIES: I have personally reviewed the radiological images as listed and agreed with the findings in the report. [place] LT BREAST BX W LOC DEV 1ST LESION IMG BX SPEC US GUIDE Addendum Date: 10/21/2023 ADDENDUM REPORT: 10/21/2023 11:55 ADDENDUM: Pathology revealed GRADE II INVASIVE DUCTAL CARCINOMA WITH CALCIFICATIONS, DUCTAL CARCINOMA IN SITU of the LEFT breast, 3 o'clock, 2cmfn, (ribbon clip). This was found to be concordant by Dr. Elberta Fortis. Pathology results were discussed with the patient by telephone. The patient reported doing well after the biopsy with tenderness and bruising at the site. Post biopsy instructions and care were reviewed and questions were answered. The patient was encouraged to call [facility] for any additional concerns. My direct phone number was provided. The patient was referred to [facility] on October 30, 2023. Pathology results reported by Rene Kocher, RN on 10/21/2023. Electronically Signed   By: Elberta Fortis M.D.   On: 10/21/2023 11:55   Result Date: 10/21/2023 CLINICAL DATA:  Patient presents for ultrasound-guided core needle biopsy of a suspicious 9 mm mass over the 3 o'clock position of the left breast 2 cm from the nipple. EXAM: ULTRASOUND GUIDED LEFT BREAST CORE NEEDLE BIOPSY COMPARISON:  Previous exam(s). PROCEDURE: I met with the patient and we discussed the procedure of ultrasound-guided biopsy, including benefits and alternatives. We discussed the high likelihood of a successful procedure. We discussed the risks of the procedure, including infection, bleeding, tissue injury, clip migration, and inadequate sampling. Informed written consent was given. The usual time-out protocol was performed immediately prior to the procedure. Lesion quadrant: Left upper outer quadrant (2:30-3  o'clock position). Using sterile technique and 1% Lidocaine as local anesthetic, under direct ultrasound visualization, a 12 gauge spring-loaded device was used to perform biopsy of the targeted 9 mm mass over the 3 o'clock position of the left breast using an inferior to superior approach. At the conclusion of the procedure a ribbon shaped tissue marker clip was deployed into the biopsy cavity. Follow up 2 view mammogram was performed and dictated separately. IMPRESSION: Ultrasound guided biopsy of a suspicious  9 mm left breast mass. No apparent complications. Electronically Signed: By: Elberta Fortis M.D. On: 10/18/2023 08:08   MM CLIP PLACEMENT LEFT Result Date: 10/18/2023 CLINICAL DATA:  Patient is post ultrasound-guided core needle biopsy of a 9 mm suspicious mass over the 3 o'clock position of the left breast. EXAM: 3D DIAGNOSTIC LEFT MAMMOGRAM POST ULTRASOUND BIOPSY COMPARISON:  Previous exam(s). FINDINGS: 3D Mammographic images were obtained following ultrasound guided biopsy of the targeted mass over the 3 o'clock position of the left breast. The biopsy marking clip is in expected position at the site of biopsy. IMPRESSION: Appropriate positioning of the ribbon shaped biopsy marking clip at the site of biopsy in the 3 o'clock position of the left breast. Final Assessment: Post Procedure Mammograms for Marker Placement Electronically Signed   By: Elberta Fortis M.D.   On: 10/18/2023 08:32   MM 3D DIAGNOSTIC MAMMOGRAM UNILATERAL LEFT BREAST Result Date: 10/16/2023 CLINICAL DATA:  Screening recall for a possible left breast mass. EXAM: DIGITAL DIAGNOSTIC UNILATERAL LEFT MAMMOGRAM WITH TOMOSYNTHESIS AND CAD; ULTRASOUND LEFT BREAST LIMITED TECHNIQUE: Left digital diagnostic mammography and breast tomosynthesis was performed. The images were evaluated with computer-aided detection. ; Targeted ultrasound examination of the left breast was performed. COMPARISON:  Previous exam(s). ACR Breast Density Category b:  There are scattered areas of fibroglandular density. FINDINGS: On spot compression imaging, the possible mass noted in the lateral, central left breast persists as a small, oval mass with irregular margins. Mass measures 8 mm in long axis. Targeted left breast ultrasound is performed, showing an oval, hypoechoic mass in the left breast at 3 o'clock, 2 cm the nipple, measuring 9 x 5 x 6 mm. Margins are partly irregular and partly ill-defined. Sonographic imaging of the left axilla demonstrates normal lymph nodes. No enlarged or abnormal lymph nodes. IMPRESSION: 1. Suspicious, 9 mm mass in the left breast at 3 o'clock, 2 cm the nipple, middle depth. Tissue sampling is indicated. No left axillary lymphadenopathy. RECOMMENDATION: 1. Ultrasound-guided core needle biopsy. Biopsy has been scheduled for 10/18/2023 at 7:30 a.m. I have discussed the findings and recommendations with the patient. If applicable, a reminder letter will be sent to the patient regarding the next appointment. BI-RADS CATEGORY  4: Suspicious. Electronically Signed   By: Amie Portland M.D.   On: 10/16/2023 16:06   [place] LIMITED ULTRASOUND INCLUDING AXILLA LEFT BREAST  Result Date: 10/16/2023 CLINICAL DATA:  Screening recall for a possible left breast mass. EXAM: DIGITAL DIAGNOSTIC UNILATERAL LEFT MAMMOGRAM WITH TOMOSYNTHESIS AND CAD; ULTRASOUND LEFT BREAST LIMITED TECHNIQUE: Left digital diagnostic mammography and breast tomosynthesis was performed. The images were evaluated with computer-aided detection. ; Targeted ultrasound examination of the left breast was performed. COMPARISON:  Previous exam(s). ACR Breast Density Category b: There are scattered areas of fibroglandular density. FINDINGS: On spot compression imaging, the possible mass noted in the lateral, central left breast persists as a small, oval mass with irregular margins. Mass measures 8 mm in long axis. Targeted left breast ultrasound is performed, showing an oval, hypoechoic mass in  the left breast at 3 o'clock, 2 cm the nipple, measuring 9 x 5 x 6 mm. Margins are partly irregular and partly ill-defined. Sonographic imaging of the left axilla demonstrates normal lymph nodes. No enlarged or abnormal lymph nodes. IMPRESSION: 1. Suspicious, 9 mm mass in the left breast at 3 o'clock, 2 cm the nipple, middle depth. Tissue sampling is indicated. No left axillary lymphadenopathy. RECOMMENDATION: 1. Ultrasound-guided core needle biopsy. Biopsy has been scheduled for 10/18/2023  at 7:30 a.m. I have discussed the findings and recommendations with the patient. If applicable, a reminder letter will be sent to the patient regarding the next appointment. BI-RADS CATEGORY  4: Suspicious. Electronically Signed   By: Amie Portland M.D.   On: 10/16/2023 16:06    ASSESSMENT & PLAN:  63 year old postmenopausal woman, presented with screening discovered left breast cancer  Malignant neoplasm of upper-outer quadrant of left breast, invasive ductal carcinoma, [id number] stage IA, ER+/PR+/HER2-, G2 --We discussed her imaging findings and the biopsy results in great details. -Giving the early stage disease, she likely need a lumpectomy. She is agreeable with that. She was seen by Dr. Dwain Sarna  today and likely will proceed with surgery soon.  -I recommend a Oncotype Dx test on the surgical sample and we'll make a decision about adjuvant chemotherapy based on the Oncotype result. Written material of this test was given to her. She is young and fit, would be a good candidate for chemotherapy if her Oncotype recurrence score is high. --Giving the strong ER and PR expression in her postmenopausal status, I recommend adjuvant endocrine therapy with aromatase inhibitor or tamoxifen for a total of 5-10 years to reduce the risk of cancer recurrence. Potential benefits and side effects were discussed with patient and she is interested. -She was also seen by radiation oncologist Dr. Sharol Roussel today.  Adjuvant radiation  was recommended. -We also discussed the breast cancer surveillance after her surgery. She will continue annual screening mammogram, self exam, and a routine office visit with lab and exam with [place]. -I encouraged her to have healthy diet and exercise regularly.    Osteopenia Mild osteopenia managed with monthly Boniva. - Continue monthly Boniva - Monitor bone density biennially  Irritable Bowel Syndrome (IBS) Chronic IBS with predominant constipation and stress-related abdominal pain. Occasional flare-ups, no current symptoms. - Provide supportive care and stress management strategies  Migraine Chronic migraines managed with Maxalt (Rizatriptan) as needed. - Continue Maxalt (Rizatriptan) as needed  General Health Maintenance Discussed importance of regular self-breast exams and annual mammograms. - Encourage regular self-breast exams - Continue annual mammograms  Plan -She will proceed with left lumpectomy and sentinel lymph node biopsy soon -Will order Oncotype on her surgical sample -I plan to see her back at the end of radiation, or sooner if Oncotype shows high risk disease  No orders of the defined types were placed in this encounter.   All questions were answered. The patient knows to call the clinic with any problems, questions or concerns. I spent 35 minutes counseling the patient face to face. The total time spent in the appointment was 45 minutes and more than 50% was on counseling.     Malachy Mood, MD 10/30/2023 4:40 PM

## 2023-10-30 NOTE — Progress Notes (Signed)
Radiation Oncology         [phone] ________________________________  Multidisciplinary Breast Oncology Clinic [facility]) Initial Outpatient Consultation  Name: Tamara Lutz MRN: [id number]  Date: 10/30/2023  DOB: [birth year]  OZ:DGUYQ, Sung Amabile, NP  Emelia Loron, MD   REFERRING PHYSICIAN: Emelia Loron, MD  DIAGNOSIS: The encounter diagnosis was Malignant neoplasm of upper-outer quadrant of left breast in female, estrogen receptor positive (HCC).  Stage IA (cT1b, cN0, cM0) Left Breast UOQ, Invasive and in situ ductal carcinoma, ER+ / PR+ / Her2-, Grade 2    ICD-10-CM   1. Malignant neoplasm of upper-outer quadrant of left breast in female, estrogen receptor positive (HCC)  C50.412    Z17.0       HISTORY OF PRESENT ILLNESS::Tamara Lutz is a 63 y.o. female who is presenting to the office today for evaluation of her newly diagnosed breast cancer. She is accompanied by her husband. She is doing well overall.   She had routine screening mammography on 09/18/23 showing a possible abnormality in the left breast. She underwent a left breast diagnostic mammography with tomography and left breast ultrasonography at [facility] on 10/16/23 showing: a suspicious mass in the 3 o'clock left breast measuring 9 mm, located 2 cmfn.   Biopsy of the 3 o'clock left breast on 10/18/23 showed: grade 2 invasive ductal carcinoma measuring 9 mm in the greatest linear extent of the sample, along with DCIS. Prognostic indicators significant for: estrogen receptor, 100% positive with strong staining intensity and progesterone receptor, 80% positive with moderate-strong staining intensity. Proliferation marker Ki67 at 10%. HER2 negative.  Of note: she has a family history of breast cancer in her sister who underwent bilateral mastectomies   Menarche: 63 years old Age at first live birth: 63 years old GP: 5 LMP: postmenopausal (LMP date not indicated) Contraceptive: yes, used  for approximately 2 years in the past  HRT: never used    The patient was referred today for presentation in the multidisciplinary conference.  Radiology studies and pathology slides were presented there for review and discussion of treatment options.  A consensus was discussed regarding potential next steps.  PREVIOUS RADIATION THERAPY: No  PAST MEDICAL HISTORY:  Past Medical History:  Diagnosis Date   Allergy    Basal cell carcinoma August 25, 1962   nod- upper right chest(CX35FU)   Hyperlipidemia    Osteopenia     PAST SURGICAL HISTORY: Past Surgical History:  Procedure Laterality Date   BREAST BIOPSY Left 10/18/2023   [place] LT BREAST BX W LOC DEV 1ST LESION IMG BX SPEC US GUIDE 10/18/2023 [facility] MAMMOGRAPHY   CESAREAN SECTION     CESAREAN SECTION N/A    Phreesia 07/31/2020   COLONOSCOPY  2019   Dr.Gupta   DILATION AND CURETTAGE OF UTERUS     FLEXIBLE SIGMOIDOSCOPY N/A 08/11/2021   Procedure: FLEXIBLE SIGMOIDOSCOPY;  Surgeon: Jeani Hawking, MD;  Location: [facility];  Service: Endoscopy;  Laterality: N/A;   HEMOSTASIS CLIP PLACEMENT  08/11/2021   Procedure: HEMOSTASIS CLIP PLACEMENT;  Surgeon: Jeani Hawking, MD;  Location: [facility];  Service: Endoscopy;;   MOUTH SURGERY     POLYPECTOMY     SCLEROTHERAPY  08/11/2021   Procedure: Susa Day;  Surgeon: Jeani Hawking, MD;  Location: [facility];  Service: Endoscopy;;    FAMILY HISTORY:  Family History  Problem Relation Age of Onset   Multiple sclerosis Mother    Uterine cancer Mother    Alzheimer's disease Father    Cancer Sister 69  Breast cancer   Breast cancer Sister 44   Diabetes Neg Hx    Heart disease Neg Hx    Hypertension Neg Hx    Colon cancer Neg Hx    Esophageal cancer Neg Hx    Rectal cancer Neg Hx    Stomach cancer Neg Hx     SOCIAL HISTORY:  Social History   Socioeconomic History   Marital status: Married    Spouse name: Not on file   Number of children: 5   Years of education: 16    Highest education level: Not on file  Occupational History   Not on file  Tobacco Use   Smoking status: Never    Passive exposure: Never   Smokeless tobacco: Never  Vaping Use   Vaping status: Never Used  Substance and Sexual Activity   Alcohol use: Yes    Comment: occasionally-2 per month   Drug use: No   Sexual activity: Yes  Other Topics Concern   Not on file  Social History Narrative   Right handed   One story home   Drinks caffeine    Social Drivers of Corporate investment banker Strain: Not on file  Food Insecurity: Not on file  Transportation Needs: Not on file  Physical Activity: Not on file  Stress: Not on file  Social Connections: Not on file    ALLERGIES:  Allergies  Allergen Reactions   Gabapentin Other (See Comments)    Dizziness, unstable feeling    MEDICATIONS:  Current Outpatient Medications  Medication Sig Dispense Refill   Calcium Carb-Cholecalciferol 600-400 MG-UNIT CAPS Take 1 capsule by mouth. (Patient not taking: Reported on 10/30/2023)     cetirizine (ZYRTEC) 10 MG chewable tablet Chew 10 mg by mouth daily.     Cholecalciferol (VITAMIN D) 50 MCG (2000 UT) CAPS Take by mouth. (Patient not taking: Reported on 10/30/2023)     fluticasone (FLONASE) 50 MCG/ACT nasal spray Place 2 sprays into both nostrils daily. 16 g 6   ibandronate (BONIVA) 150 MG tablet Take 150 mg by mouth every 30 (thirty) days. Take in the morning with a full glass of water, on an empty stomach, and do not take anything else by mouth or lie down for the next 30 min.     Magnesium 500 MG CAPS Take by mouth.     montelukast (SINGULAIR) 10 MG tablet Take 1 tablet (10 mg total) by mouth at bedtime. 90 tablet 0   Multiple Vitamin (MULTIVITAMIN) tablet Take 1 tablet by mouth daily.     rizatriptan (MAXALT) 10 MG tablet Take 10 mg (1 tablet total) by mouth at the start of the headache. May repeat in 2 hours x 1 if headache persists. Max of 2 tablets/24 hours. 12 tablet 6   No current  facility-administered medications for this encounter.    REVIEW OF SYSTEMS: A 10+ POINT REVIEW OF SYSTEMS WAS OBTAINED including neurology, dermatology, psychiatry, cardiac, respiratory, lymph, extremities, GI, GU, musculoskeletal, constitutional, reproductive, HEENT. On the provided form, she reports lower abdominal pain due to IBS (stabbing and cramping like pain), wearing glasses and contacts, bruising easily, headaches, numbness (unspecified), and anxiety . She denies any other symptoms.    PHYSICAL EXAM:     10/30/2023  Vitals with BMI   Height 5' 3.25"   Weight 143 lbs 8 oz   BMI 25.21   Systolic 136   Diastolic 63   Pulse 65    Lungs are clear to auscultation bilaterally. Heart has  regular rate and rhythm. No palpable cervical, supraclavicular, or axillary adenopathy. Abdomen soft, non-tender, normal bowel sounds. Breast: Right breast with no palpable mass, nipple discharge, or bleeding. Left breast with bruising in the lateral and lower aspect of the breast from biopsy, with no palpable mass, nipple discharge, or bleeding appreciated.   KPS = 100  100 - Normal; no complaints; no evidence of disease. 90   - Able to carry on normal activity; minor signs or symptoms of disease. 80   - Normal activity with effort; some signs or symptoms of disease. 65   - Cares for self; unable to carry on normal activity or to do active work. 60   - Requires occasional assistance, but is able to care for most of his personal needs. 50   - Requires considerable assistance and frequent medical care. 40   - Disabled; requires special care and assistance. 30   - Severely disabled; hospital admission is indicated although death not imminent. 20   - Very sick; hospital admission necessary; active supportive treatment necessary. 10   - Moribund; fatal processes progressing rapidly. 0     - Dead  Karnofsky DA, Abelmann WH, Craver LS and Burchenal [facility] [phone]) The use of the nitrogen mustards in the  palliative treatment of carcinoma: with particular reference to bronchogenic carcinoma Cancer 1 [phone]  LABORATORY DATA:  Lab Results  Component Value Date   WBC 4.2 10/30/2023   HGB 12.8 10/30/2023   HCT 40.1 10/30/2023   MCV 89.1 10/30/2023   PLT 247 10/30/2023   Lab Results  Component Value Date   NA 140 10/30/2023   K 3.7 10/30/2023   CL 106 10/30/2023   CO2 30 10/30/2023   Lab Results  Component Value Date   ALT 9 10/30/2023   AST 17 10/30/2023   ALKPHOS 62 10/30/2023   BILITOT 0.3 10/30/2023    PULMONARY FUNCTION TEST:   Review Flowsheet        No data to display          RADIOGRAPHY: [place] LT BREAST BX W LOC DEV 1ST LESION IMG BX SPEC US GUIDE Addendum Date: 10/21/2023 ADDENDUM REPORT: 10/21/2023 11:55 ADDENDUM: Pathology revealed GRADE II INVASIVE DUCTAL CARCINOMA WITH CALCIFICATIONS, DUCTAL CARCINOMA IN SITU of the LEFT breast, 3 o'clock, 2cmfn, (ribbon clip). This was found to be concordant by Dr. Elberta Fortis. Pathology results were discussed with the patient by telephone. The patient reported doing well after the biopsy with tenderness and bruising at the site. Post biopsy instructions and care were reviewed and questions were answered. The patient was encouraged to call [facility] for any additional concerns. My direct phone number was provided. The patient was referred to [facility] on October 30, 2023. Pathology results reported by Rene Kocher, RN on 10/21/2023. Electronically Signed   By: Elberta Fortis M.D.   On: 10/21/2023 11:55   Result Date: 10/21/2023 CLINICAL DATA:  Patient presents for ultrasound-guided core needle biopsy of a suspicious 9 mm mass over the 3 o'clock position of the left breast 2 cm from the nipple. EXAM: ULTRASOUND GUIDED LEFT BREAST CORE NEEDLE BIOPSY COMPARISON:  Previous exam(s). PROCEDURE: I met with the patient and we discussed the  procedure of ultrasound-guided biopsy, including benefits and alternatives. We discussed the high likelihood of a successful procedure. We discussed the risks of the procedure, including infection, bleeding, tissue injury, clip migration, and inadequate sampling. Informed  written consent was given. The usual time-out protocol was performed immediately prior to the procedure. Lesion quadrant: Left upper outer quadrant (2:30-3 o'clock position). Using sterile technique and 1% Lidocaine as local anesthetic, under direct ultrasound visualization, a 12 gauge spring-loaded device was used to perform biopsy of the targeted 9 mm mass over the 3 o'clock position of the left breast using an inferior to superior approach. At the conclusion of the procedure a ribbon shaped tissue marker clip was deployed into the biopsy cavity. Follow up 2 view mammogram was performed and dictated separately. IMPRESSION: Ultrasound guided biopsy of a suspicious 9 mm left breast mass. No apparent complications. Electronically Signed: By: Elberta Fortis M.D. On: 10/18/2023 08:08   MM CLIP PLACEMENT LEFT Result Date: 10/18/2023 CLINICAL DATA:  Patient is post ultrasound-guided core needle biopsy of a 9 mm suspicious mass over the 3 o'clock position of the left breast. EXAM: 3D DIAGNOSTIC LEFT MAMMOGRAM POST ULTRASOUND BIOPSY COMPARISON:  Previous exam(s). FINDINGS: 3D Mammographic images were obtained following ultrasound guided biopsy of the targeted mass over the 3 o'clock position of the left breast. The biopsy marking clip is in expected position at the site of biopsy. IMPRESSION: Appropriate positioning of the ribbon shaped biopsy marking clip at the site of biopsy in the 3 o'clock position of the left breast. Final Assessment: Post Procedure Mammograms for Marker Placement Electronically Signed   By: Elberta Fortis M.D.   On: 10/18/2023 08:32   MM 3D DIAGNOSTIC MAMMOGRAM UNILATERAL LEFT BREAST Result Date: 10/16/2023 CLINICAL DATA:   Screening recall for a possible left breast mass. EXAM: DIGITAL DIAGNOSTIC UNILATERAL LEFT MAMMOGRAM WITH TOMOSYNTHESIS AND CAD; ULTRASOUND LEFT BREAST LIMITED TECHNIQUE: Left digital diagnostic mammography and breast tomosynthesis was performed. The images were evaluated with computer-aided detection. ; Targeted ultrasound examination of the left breast was performed. COMPARISON:  Previous exam(s). ACR Breast Density Category b: There are scattered areas of fibroglandular density. FINDINGS: On spot compression imaging, the possible mass noted in the lateral, central left breast persists as a small, oval mass with irregular margins. Mass measures 8 mm in long axis. Targeted left breast ultrasound is performed, showing an oval, hypoechoic mass in the left breast at 3 o'clock, 2 cm the nipple, measuring 9 x 5 x 6 mm. Margins are partly irregular and partly ill-defined. Sonographic imaging of the left axilla demonstrates normal lymph nodes. No enlarged or abnormal lymph nodes. IMPRESSION: 1. Suspicious, 9 mm mass in the left breast at 3 o'clock, 2 cm the nipple, middle depth. Tissue sampling is indicated. No left axillary lymphadenopathy. RECOMMENDATION: 1. Ultrasound-guided core needle biopsy. Biopsy has been scheduled for 10/18/2023 at 7:30 a.m. I have discussed the findings and recommendations with the patient. If applicable, a reminder letter will be sent to the patient regarding the next appointment. BI-RADS CATEGORY  4: Suspicious. Electronically Signed   By: Amie Portland M.D.   On: 10/16/2023 16:06   [place] LIMITED ULTRASOUND INCLUDING AXILLA LEFT BREAST  Result Date: 10/16/2023 CLINICAL DATA:  Screening recall for a possible left breast mass. EXAM: DIGITAL DIAGNOSTIC UNILATERAL LEFT MAMMOGRAM WITH TOMOSYNTHESIS AND CAD; ULTRASOUND LEFT BREAST LIMITED TECHNIQUE: Left digital diagnostic mammography and breast tomosynthesis was performed. The images were evaluated with computer-aided detection. ; Targeted  ultrasound examination of the left breast was performed. COMPARISON:  Previous exam(s). ACR Breast Density Category b: There are scattered areas of fibroglandular density. FINDINGS: On spot compression imaging, the possible mass noted in the lateral, central left breast persists as a small,  oval mass with irregular margins. Mass measures 8 mm in long axis. Targeted left breast ultrasound is performed, showing an oval, hypoechoic mass in the left breast at 3 o'clock, 2 cm the nipple, measuring 9 x 5 x 6 mm. Margins are partly irregular and partly ill-defined. Sonographic imaging of the left axilla demonstrates normal lymph nodes. No enlarged or abnormal lymph nodes. IMPRESSION: 1. Suspicious, 9 mm mass in the left breast at 3 o'clock, 2 cm the nipple, middle depth. Tissue sampling is indicated. No left axillary lymphadenopathy. RECOMMENDATION: 1. Ultrasound-guided core needle biopsy. Biopsy has been scheduled for 10/18/2023 at 7:30 a.m. I have discussed the findings and recommendations with the patient. If applicable, a reminder letter will be sent to the patient regarding the next appointment. BI-RADS CATEGORY  4: Suspicious. Electronically Signed   By: Amie Portland M.D.   On: 10/16/2023 16:06      IMPRESSION: Stage IA (cT1b, cN0, cM0) Left Breast UOQ, Invasive and in situ ductal carcinoma, ER+ / PR+ / Her2-, Grade 2  Patient will be a good candidate for breast conservation with radiotherapy to the left breast. We discussed the general course of radiation, potential side effects, and toxicities with radiation and the patient is interested in this approach.    PLAN:  Left breast lumpectomy and SLN evaluation  Oncotype Dx  Adjuvant radiation therapy  Aromatase inhibitor    ------------------------------------------------  Billie Lade, PhD, MD  This document serves as a record of services personally performed by Antony Blackbird, MD. It was created on his behalf by Neena Rhymes, a trained medical  scribe. The creation of this record is based on the scribe's personal observations and the provider's statements to them. This document has been checked and approved by the attending provider.

## 2023-10-31 ENCOUNTER — Encounter: Payer: Self-pay | Admitting: *Deleted

## 2023-10-31 ENCOUNTER — Other Ambulatory Visit: Payer: Self-pay | Admitting: *Deleted

## 2023-10-31 NOTE — Progress Notes (Signed)
ambu

## 2023-10-31 NOTE — Progress Notes (Signed)
Received a return call from patient's spouse after I called and left message following up on financial concern sent from navigator.  Introduced myself as Dance movement psychotherapist and to provide follow up. He had questions regarding how everything would be billed. Advised authorization would be obtained from [place] team to insurance company and the claim would be sent to [employer] for payment first. He was familiar with co-insurance and OOP amounts for plan. Advised if there is any available copay assistance should chemotherapy be a part of treatment plan, a third party company [employer] would reach out to them to apply. He states he is not sure if she will have chemo but surgery is first. Advised the pre-service center will reach out regarding amounts for surgery and he may discuss concern at that time. He verbalized understanding.  He has my contact name and number for any additional financial questions or concerns.

## 2023-11-04 ENCOUNTER — Telehealth: Payer: Self-pay | Admitting: Genetic Counselor

## 2023-11-04 ENCOUNTER — Encounter: Payer: Self-pay | Admitting: Genetic Counselor

## 2023-11-04 DIAGNOSIS — Z1379 Encounter for other screening for genetic and chromosomal anomalies: Secondary | ICD-10-CM | POA: Insufficient documentation

## 2023-11-04 NOTE — Telephone Encounter (Signed)
I met briefly with Tamara Lutz and her husband to have her sign a medical record release for her genetic testing performed in 2019 through her GYN office.  This will be uploaded to her chart once received under the results section > molecular pathology.

## 2023-11-05 ENCOUNTER — Other Ambulatory Visit: Payer: Self-pay | Admitting: General Surgery

## 2023-11-05 DIAGNOSIS — C50412 Malignant neoplasm of upper-outer quadrant of left female breast: Secondary | ICD-10-CM

## 2023-11-05 DIAGNOSIS — Z170 Estrogen receptor positive status [ER+]: Secondary | ICD-10-CM

## 2023-11-07 ENCOUNTER — Encounter: Payer: Self-pay | Admitting: *Deleted

## 2023-11-08 DIAGNOSIS — Z9889 Other specified postprocedural states: Secondary | ICD-10-CM | POA: Insufficient documentation

## 2023-11-14 ENCOUNTER — Inpatient Hospital Stay: Payer: Managed Care, Other (non HMO) | Attending: Hematology | Admitting: Licensed Clinical Social Worker

## 2023-11-14 DIAGNOSIS — C50412 Malignant neoplasm of upper-outer quadrant of left female breast: Secondary | ICD-10-CM

## 2023-11-14 NOTE — Progress Notes (Signed)
 [facility] CSW Progress Note  Visual Merchandiser  spoke w/ pt's husband over the phone regarding medical expense concerns.  Per pt's husband they have received a call from billing informing them of the amount they will have to pay prior to pt having surgery.  Pt's spouse inquiring why everything needs to be paid up front.  CSW instructed pt's husband to contact billing and request the charges be sent through insurance first and then request a payment plan to take care of the remaining balance.  CSW also emailed a copy of the [employer] care which if pt is approved could be used towards some of the out of pocket expense.  CSW to remain available as appropriate to provide support throughout duration of treatment.        Devere JONELLE Manna, LCSW Clinical Social Worker [facility]

## 2023-11-26 ENCOUNTER — Other Ambulatory Visit: Payer: Self-pay

## 2023-11-26 ENCOUNTER — Encounter (HOSPITAL_BASED_OUTPATIENT_CLINIC_OR_DEPARTMENT_OTHER): Payer: Self-pay | Admitting: General Surgery

## 2023-12-02 ENCOUNTER — Ambulatory Visit
Admission: RE | Admit: 2023-12-02 | Discharge: 2023-12-02 | Disposition: A | Discharge status: Home / Self Care | Payer: Managed Care, Other (non HMO) | Source: Ambulatory Visit | Attending: General Surgery | Admitting: General Surgery

## 2023-12-02 DIAGNOSIS — Z170 Estrogen receptor positive status [ER+]: Secondary | ICD-10-CM

## 2023-12-02 HISTORY — PX: BREAST BIOPSY: SHX20

## 2023-12-02 NOTE — Anesthesia Preprocedure Evaluation (Signed)
 Anesthesia Evaluation  Patient identified by MRN, date of birth, ID band Patient awake    Reviewed: Allergy & Precautions, NPO status , Patient's Chart, lab work & pertinent test results  Airway Mallampati: II  TM Distance: >3 FB Neck ROM: Full    Dental no notable dental hx. (+) Dental Advisory Given, Teeth Intact   Pulmonary neg pulmonary ROS   Pulmonary exam normal breath sounds clear to auscultation       Cardiovascular negative cardio ROS Normal cardiovascular exam Rhythm:Regular Rate:Normal     Neuro/Psych  Headaches    GI/Hepatic negative GI ROS, Neg liver ROS,,,  Endo/Other  negative endocrine ROS    Renal/GU negative Renal ROS     Musculoskeletal negative musculoskeletal ROS (+)    Abdominal   Peds  Hematology negative hematology ROS (+)   Anesthesia Other Findings   Reproductive/Obstetrics                             Anesthesia Physical Anesthesia Plan  ASA: 2  Anesthesia Plan: General   Post-op Pain Management: Tylenol PO (pre-op)* and Regional block*   Induction: Intravenous  PONV Risk Score and Plan: 3 and Ondansetron, Dexamethasone, Treatment may vary due to age or medical condition and Midazolam  Airway Management Planned: LMA  Additional Equipment: None  Intra-op Plan:   Post-operative Plan: Extubation in OR  Informed Consent: I have reviewed the patients History and Physical, chart, labs and discussed the procedure including the risks, benefits and alternatives for the proposed anesthesia with the patient or authorized representative who has indicated his/her understanding and acceptance.     Dental advisory given  Plan Discussed with: CRNA  Anesthesia Plan Comments:         Anesthesia Quick Evaluation

## 2023-12-03 ENCOUNTER — Encounter (HOSPITAL_BASED_OUTPATIENT_CLINIC_OR_DEPARTMENT_OTHER): Payer: Self-pay | Admitting: General Surgery

## 2023-12-03 ENCOUNTER — Other Ambulatory Visit: Payer: Self-pay

## 2023-12-03 ENCOUNTER — Encounter (HOSPITAL_BASED_OUTPATIENT_CLINIC_OR_DEPARTMENT_OTHER)
Admission: RE | Disposition: A | Discharge status: Home / Self Care | Payer: Self-pay | Source: Home / Self Care | Attending: General Surgery

## 2023-12-03 ENCOUNTER — Ambulatory Visit (HOSPITAL_BASED_OUTPATIENT_CLINIC_OR_DEPARTMENT_OTHER): Payer: Self-pay | Admitting: Anesthesiology

## 2023-12-03 ENCOUNTER — Ambulatory Visit (HOSPITAL_BASED_OUTPATIENT_CLINIC_OR_DEPARTMENT_OTHER)
Admission: RE | Admit: 2023-12-03 | Discharge: 2023-12-03 | Disposition: A | Discharge status: Home / Self Care | Payer: Managed Care, Other (non HMO) | Attending: General Surgery | Admitting: General Surgery

## 2023-12-03 ENCOUNTER — Ambulatory Visit
Admission: RE | Admit: 2023-12-03 | Discharge: 2023-12-03 | Disposition: A | Discharge status: Home / Self Care | Payer: Managed Care, Other (non HMO) | Source: Ambulatory Visit | Attending: General Surgery | Admitting: General Surgery

## 2023-12-03 DIAGNOSIS — Z170 Estrogen receptor positive status [ER+]: Secondary | ICD-10-CM | POA: Insufficient documentation

## 2023-12-03 DIAGNOSIS — N6042 Mammary duct ectasia of left breast: Secondary | ICD-10-CM | POA: Diagnosis not present

## 2023-12-03 DIAGNOSIS — N6489 Other specified disorders of breast: Secondary | ICD-10-CM | POA: Insufficient documentation

## 2023-12-03 DIAGNOSIS — C50912 Malignant neoplasm of unspecified site of left female breast: Secondary | ICD-10-CM | POA: Diagnosis present

## 2023-12-03 DIAGNOSIS — N6082 Other benign mammary dysplasias of left breast: Secondary | ICD-10-CM | POA: Insufficient documentation

## 2023-12-03 DIAGNOSIS — Z1721 Progesterone receptor positive status: Secondary | ICD-10-CM | POA: Diagnosis not present

## 2023-12-03 DIAGNOSIS — N6022 Fibroadenosis of left breast: Secondary | ICD-10-CM | POA: Insufficient documentation

## 2023-12-03 DIAGNOSIS — Z01818 Encounter for other preprocedural examination: Secondary | ICD-10-CM

## 2023-12-03 DIAGNOSIS — C50412 Malignant neoplasm of upper-outer quadrant of left female breast: Secondary | ICD-10-CM

## 2023-12-03 HISTORY — PX: BREAST LUMPECTOMY WITH RADIOACTIVE SEED AND SENTINEL LYMPH NODE BIOPSY: SHX6550

## 2023-12-03 HISTORY — DX: Headache, unspecified: R51.9

## 2023-12-03 SURGERY — BREAST LUMPECTOMY WITH RADIOACTIVE SEED AND SENTINEL LYMPH NODE BIOPSY
Anesthesia: General | Site: Breast | Laterality: Left

## 2023-12-03 MED ORDER — BUPIVACAINE HCL (PF) 0.25 % IJ SOLN
INTRAMUSCULAR | Status: DC | PRN
  Administered 2023-12-03: 6 mL

## 2023-12-03 MED ORDER — FENTANYL CITRATE (PF) 100 MCG/2ML IJ SOLN
100.0000 ug | Freq: Once | INTRAMUSCULAR | Status: AC
  Administered 2023-12-03: 100 ug via INTRAVENOUS

## 2023-12-03 MED ORDER — MIDAZOLAM HCL 2 MG/2ML IJ SOLN
1.0000 mg | Freq: Once | INTRAMUSCULAR | Status: AC
  Administered 2023-12-03: 1 mg via INTRAVENOUS

## 2023-12-03 MED ORDER — CLONIDINE HCL (ANALGESIA) 100 MCG/ML EP SOLN
EPIDURAL | Status: DC | PRN
  Administered 2023-12-03: 80 ug

## 2023-12-03 MED ORDER — ONDANSETRON HCL 4 MG/2ML IJ SOLN
INTRAMUSCULAR | Status: AC
  Filled 2023-12-03: qty 2

## 2023-12-03 MED ORDER — ONDANSETRON HCL 4 MG/2ML IJ SOLN
INTRAMUSCULAR | Status: DC | PRN
  Administered 2023-12-03: 4 mg via INTRAVENOUS

## 2023-12-03 MED ORDER — OXYCODONE HCL 5 MG/5ML PO SOLN: 5.0000 mg | Freq: Once | ORAL | Status: AC | PRN

## 2023-12-03 MED ORDER — PROPOFOL 10 MG/ML IV BOLUS
INTRAVENOUS | Status: DC | PRN
  Administered 2023-12-03: 120 mg via INTRAVENOUS
  Administered 2023-12-03: 40 mg via INTRAVENOUS

## 2023-12-03 MED ORDER — EPHEDRINE SULFATE (PRESSORS) 50 MG/ML IJ SOLN
INTRAMUSCULAR | Status: DC | PRN
  Administered 2023-12-03 (×4): 5 mg via INTRAVENOUS

## 2023-12-03 MED ORDER — CEFAZOLIN SODIUM-DEXTROSE 2-4 GM/100ML-% IV SOLN
INTRAVENOUS | Status: AC
  Filled 2023-12-03: qty 100

## 2023-12-03 MED ORDER — PROPOFOL 10 MG/ML IV BOLUS
INTRAVENOUS | Status: AC
  Filled 2023-12-03: qty 20

## 2023-12-03 MED ORDER — FENTANYL CITRATE (PF) 100 MCG/2ML IJ SOLN
INTRAMUSCULAR | Status: AC
  Filled 2023-12-03: qty 2

## 2023-12-03 MED ORDER — FENTANYL CITRATE (PF) 100 MCG/2ML IJ SOLN
INTRAMUSCULAR | Status: DC | PRN
Start: 2023-12-03 — End: 2023-12-03
  Administered 2023-12-03: 25 ug via INTRAVENOUS

## 2023-12-03 MED ORDER — CHLORHEXIDINE GLUCONATE CLOTH 2 % EX PADS: 6.0000 | MEDICATED_PAD | Freq: Once | CUTANEOUS | Status: DC

## 2023-12-03 MED ORDER — LIDOCAINE HCL (CARDIAC) PF 100 MG/5ML IV SOSY
PREFILLED_SYRINGE | INTRAVENOUS | Status: DC | PRN
  Administered 2023-12-03: 60 mg via INTRAVENOUS

## 2023-12-03 MED ORDER — KETOROLAC TROMETHAMINE 15 MG/ML IJ SOLN
INTRAMUSCULAR | Status: AC
  Filled 2023-12-03: qty 1

## 2023-12-03 MED ORDER — DEXMEDETOMIDINE HCL IN NACL 80 MCG/20ML IV SOLN
INTRAVENOUS | Status: AC
  Filled 2023-12-03: qty 20

## 2023-12-03 MED ORDER — OXYCODONE HCL 5 MG PO TABS
ORAL_TABLET | ORAL | Status: AC
  Filled 2023-12-03: qty 1

## 2023-12-03 MED ORDER — HEPARIN SOD (PORK) LOCK FLUSH 100 UNIT/ML IV SOLN
INTRAVENOUS | Status: AC
  Filled 2023-12-03: qty 5

## 2023-12-03 MED ORDER — DEXAMETHASONE SODIUM PHOSPHATE 10 MG/ML IJ SOLN
INTRAMUSCULAR | Status: DC | PRN
  Administered 2023-12-03: 10 mg via INTRAVENOUS

## 2023-12-03 MED ORDER — MAGTRACE LYMPHATIC TRACER
INTRAMUSCULAR | Status: DC | PRN
Start: 2023-12-03 — End: 2023-12-03
  Administered 2023-12-03: 2 mL via INTRAMUSCULAR

## 2023-12-03 MED ORDER — OXYCODONE HCL 5 MG PO TABS: 5.0000 mg | ORAL_TABLET | Freq: Four times a day (QID) | ORAL | 0 refills | Status: DC | PRN

## 2023-12-03 MED ORDER — PROPOFOL 500 MG/50ML IV EMUL
INTRAVENOUS | Status: DC | PRN
  Administered 2023-12-03: 150 ug/kg/min via INTRAVENOUS

## 2023-12-03 MED ORDER — OXYCODONE HCL 5 MG PO TABS
5.0000 mg | ORAL_TABLET | Freq: Once | ORAL | Status: AC | PRN
  Administered 2023-12-03: 5 mg via ORAL

## 2023-12-03 MED ORDER — 0.9 % SODIUM CHLORIDE (POUR BTL) OPTIME
TOPICAL | Status: DC | PRN
  Administered 2023-12-03: 500 mL

## 2023-12-03 MED ORDER — ROPIVACAINE HCL 5 MG/ML IJ SOLN
INTRAMUSCULAR | Status: DC | PRN
  Administered 2023-12-03: 30 mL via PERINEURAL

## 2023-12-03 MED ORDER — ACETAMINOPHEN 500 MG PO TABS
ORAL_TABLET | ORAL | Status: AC
  Filled 2023-12-03: qty 2

## 2023-12-03 MED ORDER — DROPERIDOL 2.5 MG/ML IJ SOLN: 0.6250 mg | Freq: Once | INTRAMUSCULAR | Status: DC | PRN

## 2023-12-03 MED ORDER — HYDROMORPHONE HCL 1 MG/ML IJ SOLN
0.2500 mg | INTRAMUSCULAR | Status: DC | PRN
  Administered 2023-12-03: 0.25 mg via INTRAVENOUS

## 2023-12-03 MED ORDER — DEXAMETHASONE SODIUM PHOSPHATE 4 MG/ML IJ SOLN
INTRAMUSCULAR | Status: DC | PRN
  Administered 2023-12-03: 5 mg

## 2023-12-03 MED ORDER — LIDOCAINE 2% (20 MG/ML) 5 ML SYRINGE
INTRAMUSCULAR | Status: AC
  Filled 2023-12-03: qty 5

## 2023-12-03 MED ORDER — BUPIVACAINE HCL (PF) 0.25 % IJ SOLN
INTRAMUSCULAR | Status: AC
  Filled 2023-12-03: qty 60

## 2023-12-03 MED ORDER — EPHEDRINE 5 MG/ML INJ
INTRAVENOUS | Status: AC
  Filled 2023-12-03: qty 5

## 2023-12-03 MED ORDER — ENSURE PRE-SURGERY PO LIQD: 296.0000 mL | Freq: Once | ORAL | Status: DC

## 2023-12-03 MED ORDER — LACTATED RINGERS IV SOLN
INTRAVENOUS | Status: DC
  Administered 2023-12-03: 1000 mL via INTRAVENOUS

## 2023-12-03 MED ORDER — ACETAMINOPHEN 500 MG PO TABS
1000.0000 mg | ORAL_TABLET | Freq: Once | ORAL | Status: AC
  Administered 2023-12-03: 1000 mg via ORAL

## 2023-12-03 MED ORDER — KETOROLAC TROMETHAMINE 15 MG/ML IJ SOLN
15.0000 mg | INTRAMUSCULAR | Status: AC
Start: 2023-12-03 — End: 2023-12-03
  Administered 2023-12-03: 15 mg via INTRAVENOUS

## 2023-12-03 MED ORDER — HEPARIN (PORCINE) IN NACL 1000-0.9 UT/500ML-% IV SOLN
INTRAVENOUS | Status: AC
  Filled 2023-12-03: qty 500

## 2023-12-03 MED ORDER — HYDROMORPHONE HCL 1 MG/ML IJ SOLN
INTRAMUSCULAR | Status: AC
  Filled 2023-12-03: qty 0.5

## 2023-12-03 MED ORDER — ACETAMINOPHEN 500 MG PO TABS: 1000.0000 mg | ORAL_TABLET | ORAL | Status: DC

## 2023-12-03 MED ORDER — CEFAZOLIN SODIUM-DEXTROSE 2-4 GM/100ML-% IV SOLN
2.0000 g | INTRAVENOUS | Status: AC
  Administered 2023-12-03: 2 g via INTRAVENOUS

## 2023-12-03 MED ORDER — DEXAMETHASONE SODIUM PHOSPHATE 10 MG/ML IJ SOLN
INTRAMUSCULAR | Status: AC
  Filled 2023-12-03: qty 1

## 2023-12-03 MED ORDER — MIDAZOLAM HCL 2 MG/2ML IJ SOLN
INTRAMUSCULAR | Status: AC
  Filled 2023-12-03: qty 2

## 2023-12-03 SURGICAL SUPPLY — 50 items
APPLIER CLIP 9.375 MED OPEN (MISCELLANEOUS) ×1 IMPLANT
BINDER BREAST LRG (GAUZE/BANDAGES/DRESSINGS) IMPLANT
BINDER BREAST MEDIUM (GAUZE/BANDAGES/DRESSINGS) IMPLANT
BINDER BREAST XLRG (GAUZE/BANDAGES/DRESSINGS) IMPLANT
BINDER BREAST XXLRG (GAUZE/BANDAGES/DRESSINGS) IMPLANT
BLADE SURG 15 STRL LF DISP TIS (BLADE) ×1 IMPLANT
CANISTER SUC SOCK COL 7IN (MISCELLANEOUS) IMPLANT
CANISTER SUCT 1200ML W/VALVE (MISCELLANEOUS) IMPLANT
CHLORAPREP W/TINT 26 (MISCELLANEOUS) ×1 IMPLANT
CLIP APPLIE 9.375 MED OPEN (MISCELLANEOUS) IMPLANT
CLIP TI WIDE RED SMALL 6 (CLIP) ×1 IMPLANT
COVER BACK TABLE 60X90IN (DRAPES) ×1 IMPLANT
COVER MAYO STAND STRL (DRAPES) ×1 IMPLANT
COVER PROBE CYLINDRICAL 5X96 (MISCELLANEOUS) ×1 IMPLANT
DERMABOND ADVANCED .7 DNX12 (GAUZE/BANDAGES/DRESSINGS) ×1 IMPLANT
DRAPE LAPAROSCOPIC ABDOMINAL (DRAPES) ×1 IMPLANT
DRAPE UTILITY XL STRL (DRAPES) ×1 IMPLANT
ELECT COATED BLADE 2.86 ST (ELECTRODE) ×1 IMPLANT
ELECT REM PT RETURN 9FT ADLT (ELECTROSURGICAL) ×1 IMPLANT
ELECTRODE REM PT RTRN 9FT ADLT (ELECTROSURGICAL) ×1 IMPLANT
GLOVE BIO SURGEON STRL SZ7 (GLOVE) ×2 IMPLANT
GLOVE BIOGEL PI IND STRL 7.5 (GLOVE) ×1 IMPLANT
GOWN STRL REUS W/ TWL LRG LVL3 (GOWN DISPOSABLE) ×2 IMPLANT
HEMOSTAT ARISTA ABSORB 3G PWDR (HEMOSTASIS) IMPLANT
KIT MARKER MARGIN INK (KITS) ×1 IMPLANT
NDL HYPO 25X1 1.5 SAFETY (NEEDLE) ×1 IMPLANT
NDL SAFETY ECLIPSE 18X1.5 (NEEDLE) IMPLANT
NEEDLE HYPO 25X1 1.5 SAFETY (NEEDLE) ×1 IMPLANT
NS IRRIG 1000ML POUR BTL (IV SOLUTION) IMPLANT
PACK BASIN DAY SURGERY FS (CUSTOM PROCEDURE TRAY) ×1 IMPLANT
PENCIL SMOKE EVACUATOR (MISCELLANEOUS) ×1 IMPLANT
RETRACTOR ONETRAX LX 90X20 (MISCELLANEOUS) IMPLANT
SLEEVE SCD COMPRESS KNEE MED (STOCKING) ×1 IMPLANT
SPIKE FLUID TRANSFER (MISCELLANEOUS) IMPLANT
SPONGE T-LAP 4X18 ~~LOC~~+RFID (SPONGE) ×1 IMPLANT
STRIP CLOSURE SKIN 1/2X4 (GAUZE/BANDAGES/DRESSINGS) ×1 IMPLANT
SUT ETHILON 2 0 FS 18 (SUTURE) IMPLANT
SUT MNCRL AB 4-0 PS2 18 (SUTURE) ×1 IMPLANT
SUT MON AB 5-0 PS2 18 (SUTURE) IMPLANT
SUT SILK 2 0 SH (SUTURE) IMPLANT
SUT VIC AB 2-0 SH 27XBRD (SUTURE) ×1 IMPLANT
SUT VIC AB 3-0 SH 27X BRD (SUTURE) ×1 IMPLANT
SUT VIC AB 5-0 PS2 18 (SUTURE) IMPLANT
SYR 10ML LL (SYRINGE) IMPLANT
SYR CONTROL 10ML LL (SYRINGE) ×1 IMPLANT
TOWEL GREEN STERILE FF (TOWEL DISPOSABLE) ×1 IMPLANT
TRACER MAGTRACE VIAL (MISCELLANEOUS) IMPLANT
TRAY FAXITRON CT DISP (TRAY / TRAY PROCEDURE) ×1 IMPLANT
TUBE CONNECTING 20X1/4 (TUBING) IMPLANT
YANKAUER SUCT BULB TIP NO VENT (SUCTIONS) IMPLANT

## 2023-12-03 NOTE — Discharge Instructions (Addendum)
 [facility] Office Phone Number [phone]  POST OP INSTRUCTIONS Take 400 mg of ibuprofen every 8 hours or 650 mg tylenol every 6 hours for next 72 hours then as needed. Use ice several times daily also.  A prescription for pain medication may be given to you upon discharge.  Take your pain medication as prescribed, if needed.  If narcotic pain medicine is not needed, then you may take acetaminophen (Tylenol), naprosyn (Alleve) or ibuprofen (Advil) as needed. Take your usually prescribed medications unless otherwise directed If you need a refill on your pain medication, please contact your pharmacy.  They will contact our office to request authorization.  Prescriptions will not be filled after 5pm or on week-ends. You should eat very light the first 24 hours after surgery, such as soup, crackers, pudding, etc.  Resume your normal diet the day after surgery. Most patients will experience some swelling and bruising in the breast.  Ice packs and a good support bra will help.  Wear the breast binder provided or a sports bra for 72 hours day and night.  After that wear a sports bra during the day until you return to the office. Swelling and bruising can take several days to resolve.  It is common to experience some constipation if taking pain medication after surgery.  Increasing fluid intake and taking a stool softener will usually help or prevent this problem from occurring.  A mild laxative (Milk of Magnesia or Miralax) should be taken according to package directions if there are no bowel movements after 48 hours. I used skin glue on the incision, you may shower in 24 hours.  The glue will flake off over the next 2-3 weeks.  Any sutures or staples will be removed at the office during your follow-up visit. ACTIVITIES:  You may resume regular daily activities (gradually increasing) beginning the next day.  Wearing a good support bra or sports bra minimizes pain and swelling.  You may have  sexual intercourse when it is comfortable. You may drive when you no longer are taking prescription pain medication, you can comfortably wear a seatbelt, and you can safely maneuver your car and apply brakes. RETURN TO WORK:  ______________________________________________________________________________________ Bonita Quin should see your doctor in the office for a follow-up appointment approximately two weeks after your surgery.  Your doctor's nurse will typically make your follow-up appointment when she calls you with your pathology report.  Expect your pathology report 3-4 business days after your surgery.  You may call to check if you do not hear from [place] after three days. OTHER INSTRUCTIONS: _______________________________________________________________________________________________ _____________________________________________________________________________________________________________________________________ _____________________________________________________________________________________________________________________________________ _____________________________________________________________________________________________________________________________________  WHEN TO CALL DR WAKEFIELD: Fever over 101.0 Nausea and/or vomiting. Extreme swelling or bruising. Continued bleeding from incision. Increased pain, redness, or drainage from the incision.  The clinic staff is available to answer your questions during regular business hours.  Please don't hesitate to call and ask to speak to one of the nurses for clinical concerns.  If you have a medical emergency, go to the nearest emergency room or call 911.  A surgeon from [facility] is always on call at the hospital.  For further questions, please visit [email or web address]   No Tylenol before 2:00pm if needed. No ibuprofen before 5:00pm if needed.  Post Anesthesia Home Care Instructions  Activity: Get  plenty of rest for the remainder of the day. A responsible individual must stay with you for 24 hours following the procedure.  For the next 24 hours, DO NOT: -Drive  a car -Advertising copywriter -Drink alcoholic beverages -Take any medication unless instructed by your physician -Make any legal decisions or sign important papers.  Meals: Start with liquid foods such as gelatin or soup. Progress to regular foods as tolerated. Avoid greasy, spicy, heavy foods. If nausea and/or vomiting occur, drink only clear liquids until the nausea and/or vomiting subsides. Call your physician if vomiting continues.  Special Instructions/Symptoms: Your throat may feel dry or sore from the anesthesia or the breathing tube placed in your throat during surgery. If this causes discomfort, gargle with warm salt water. The discomfort should disappear within 24 hours.

## 2023-12-03 NOTE — Anesthesia Procedure Notes (Signed)
 Procedure Name: LMA Insertion Date/Time: 12/03/2023 9:14 AM  Performed by: Lauralyn Primes, CRNAPre-anesthesia Checklist: Patient identified, Emergency Drugs available, Suction available and Patient being monitored Patient Re-evaluated:Patient Re-evaluated prior to induction Oxygen Delivery Method: Circle system utilized Preoxygenation: Pre-oxygenation with 100% oxygen Induction Type: IV induction Ventilation: Mask ventilation without difficulty LMA: LMA inserted LMA Size: 4.0 Number of attempts: 1 Airway Equipment and Method: Bite block Placement Confirmation: positive ETCO2 Tube secured with: Tape Dental Injury: Teeth and Oropharynx as per pre-operative assessment

## 2023-12-03 NOTE — Anesthesia Procedure Notes (Addendum)
 Anesthesia Regional Block: Pectoralis block   Pre-Anesthetic Checklist: , timeout performed,  Correct Patient, Correct Site, Correct Laterality,  Correct Procedure, Correct Position, site marked,  Risks and benefits discussed,  Surgical consent,  Pre-op evaluation,  At surgeon's request and post-op pain management  Laterality: Left  Prep: chloraprep       Needles:   Needle Type: Stimiplex     Needle Length: 9cm      Additional Needles:   Procedures:,,,, ultrasound used (permanent image in chart),,    Narrative:  Start time: 12/03/2023 8:31 AM End time: 12/03/2023 8:51 AM Injection made incrementally with aspirations every 5 mL.  Performed by: Personally  Anesthesiologist: Lewie Loron, MD  Additional Notes: BP cuff, SpO2 and EKG monitors applied. Sedation begun.  Anesthetic injected incrementally, slowly, and after neg aspirations under direct ultrasound guidance. Good fascial spread noted. Patient tolerated well.

## 2023-12-03 NOTE — H&P (Signed)
 63 yof who has left breast mass on screening mm. She has b density breast tissue. She has fh of breast cancer in sister at age 62. She has central 9x6x5 mm mass. Ax [place] is negative. 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   Review of Systems: A complete review of systems was obtained from the patient. I have reviewed this information and discussed as appropriate with the patient. See HPI as well for other ROS.  Review of Systems  All other systems reviewed and are negative.  Medical History: Past Medical History:  Diagnosis Date  Aneurysm (CMS-HCC)  Anxiety  History of cancer  Hyperlipidemia   Past Surgical History:  Procedure Laterality Date  CESAREAN SECTION  polypectomy   No Known Allergies  Current Outpatient Medications on File Prior to Visit  Medication Sig Dispense Refill  calcium carbonate-vitamin D3 600 mg-10 mcg (400 unit) Cap Take 1 capsule by mouth  cetirizine (ZYRTEC) 10 MG chewable tablet Take 10 mg by mouth  cholecalciferol (VITAMIN D3) 2,000 unit capsule Take by mouth  fluticasone propionate (FLONASE) 50 mcg/actuation nasal spray Place 2 sprays into both nostrils once daily  ibandronate (BONIVA) 150 mg tablet TAKE 1 TABLET BY MOUTH ONCE EVERY MONTH  magnesium oxide 500 mg Cap Take by mouth  montelukast (SINGULAIR) 10 mg tablet Take 1 tablet by mouth at bedtime  multivitamin tablet Take 1 tablet by mouth once daily  rizatriptan (MAXALT) 10 MG tablet TAKE 10 MG BY MOUTH AT THE START OF THE HEADACHE. MAY REPEAT IN 2 HOURS IF HEADACHE PERSISTS. MAX OF 2 TABLETS 24 HOURS   Family History  Problem Relation Age of Onset  High blood pressure (Hypertension) Sister  Breast cancer Sister    Social History   Tobacco Use  Smoking Status Never  Smokeless Tobacco Never  Marital status: Married  Tobacco Use  Smoking status: Never  Smokeless tobacco: Never   Objective:  Physical Exam Vitals reviewed.   Constitutional:  Appearance: Normal appearance.  Chest:  Breasts: Right: No inverted nipple, mass or nipple discharge.  Left: No inverted nipple, mass or nipple discharge.  Lymphadenopathy:  Upper Body:  Right upper body: No supraclavicular or axillary adenopathy.  Left upper body: No supraclavicular or axillary adenopathy.  Neurological:  Mental Status: She is alert.     Assessment and Plan:   Left breast seed guided lumpectomy Left axillary sentinel node biopsy  We discussed the staging and pathophysiology of breast cancer. We discussed all of the different options for treatment for breast cancer including surgery, chemotherapy, radiation therapy, Herceptin, and antiestrogen therapy.  We discussed a sentinel lymph node biopsy as she does not appear to having lymph node involvement right now. We discussed the performance of that with injection of magtrace We discussed that there is a chance of having a positive node with a sentinel lymph node biopsy and we will await the permanent pathology to make any other first further decisions in terms of her treatment. We discussed up to a 5% risk lifetime of chronic shoulder pain as well as lymphedema associated with a sentinel lymph node biopsy.  We discussed the options for treatment of the breast cancer which included lumpectomy versus a mastectomy. We discussed the performance of the lumpectomy with radioactive seed placement. We discussed a 5-10% chance of a positive margin requiring reexcision in the operating room. We also discussed that she will likely need radiation  therapy if she undergoes lumpectomy. We discussed mastectomy and the postoperative care for that as well. Mastectomy can be followed by reconstruction. The decision for lumpectomy vs mastectomy has no impact on decision for chemotherapy. Most mastectomy patients will not need radiation therapy. We discussed that there is no difference in her survival whether she undergoes  lumpectomy with radiation therapy or antiestrogen therapy versus a mastectomy. There is also no real difference between her recurrence in the breast.  We discussed the risks of operation including bleeding, infection, possible reoperation. She understands her further therapy will be based on what her stages at the time of her operation.

## 2023-12-03 NOTE — Progress Notes (Signed)
Assisted Dr. Germeroth with left, axillary, ultrasound guided block. Side rails up, monitors on throughout procedure. See vital signs in flow sheet. Tolerated Procedure well. 

## 2023-12-03 NOTE — Op Note (Signed)
 Preoperative diagnosis: Clinical stage I left  breast cancer Postoperative diagnosis: Same as above Procedure: 1.  Left breast radioactive seed guided lumpectomy 2.  Left deep axillary sentinel lymph node biopsy 3.  Injection of magtrace for sentinel node identification Surgeon: Dr. Harden Mo Anesthesia: General With a pectoral block Estimated blood loss: < 50 cc Specimens: 1. Left breast lumpectomy marked with paint containing seed and clip 2. Additional medial and superior margins marked short superior, long lateral, double deep 3. Left deep axillary sentinel nodes Complications: None Drains: None Sponge and count was correct completion Disposition recovery stable addition   Indications: 41 yof who has left breast mass on screening mm. She has b density breast tissue. She has fh of breast cancer in sister at age 13. She has central 9x6x5 mm mass. Ax [place] is negative. 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    Procedure: After informed consent was obtained she first underwent a pectoral block.  I had her images available for review.   She was given antibiotics.  SCDs were in place.  She was placed under general anesthesia without complication.  She was prepped and draped in a standard sterile surgical fashion.   I injected 1 cc of magtrace subareolar. I also injected 1 cc peritumoral as well. I massaged this for 5 minutes.    I located the seed in the lateral breast. I elected to make a periareolar incision to hide the scar later.  I dissected to the seed and removed the seed and attempted to get a clear rim of tissue around it.   Mammogram confirmed removal and appeared I was close to a couple of margins so I removed these as well.  Hemostasis was then obtained.  I placed a couple clips in the cavity.  I closed down this cavity with several layers of 2-0 Vicryl.  The skin was  closed with 3-0 Vicryl and the skin with 4-0 Monocryl.  Glue and Steri-Strips were eventually applied.     I then made an incision below the axillary hairline and carried this through the axillary fascia.  I was able to identify a tiny node with counts over 500.  There appeared to be a couple other nodes right there that I removed.    There was no background activity.  There are no more palpable nodes present.  I then obtained hemostasis.  I closed it down with 2-0 Vicryl, 3-0 Vicryl, and 5-0 Monocryl.  Glue and Steri-Strips were applied.  She tolerated this well was extubated and transferred to recovery.

## 2023-12-03 NOTE — Anesthesia Postprocedure Evaluation (Signed)
 Anesthesia Post Note  Patient: Tamara Lutz  Procedure(s) Performed: LEFT BREAST SEED GUIDED LUMPECTOMY, LEFT AXILLARY SENTINEL NODE BIOPSY (Left: Breast)     Patient location during evaluation: PACU Anesthesia Type: General Level of consciousness: sedated and patient cooperative Pain management: pain level controlled Vital Signs Assessment: post-procedure vital signs reviewed and stable Respiratory status: spontaneous breathing Cardiovascular status: stable Anesthetic complications: no   No notable events documented.  Last Vitals:  Vitals:   12/03/23 1130 12/03/23 1200  BP: 125/81 129/88  Pulse: (!) 55 (!) 56  Resp: 15 16  Temp:  36.7 C  SpO2: 99% 99%    Last Pain:  Vitals:   12/03/23 1204  TempSrc:   PainSc: 3                  Lewie Loron

## 2023-12-03 NOTE — Transfer of Care (Signed)
 Immediate Anesthesia Transfer of Care Note  Patient: Tamara Lutz  Procedure(s) Performed: LEFT BREAST SEED GUIDED LUMPECTOMY, LEFT AXILLARY SENTINEL NODE BIOPSY (Left: Breast)  Patient Location: PACU  Anesthesia Type:GA combined with regional for post-op pain  Level of Consciousness: drowsy  Airway & Oxygen Therapy: Patient Spontanous Breathing and Patient connected to face mask oxygen  Post-op Assessment: Report given to RN and Post -op Vital signs reviewed and stable  Post vital signs: Reviewed and stable  Last Vitals:  Vitals Value Taken Time  BP 111/79 12/03/23 1028  Temp    Pulse 75 12/03/23 1029  Resp 18 12/03/23 1029  SpO2 99 % 12/03/23 1029  Vitals shown include unfiled device data.  Last Pain:  Vitals:   12/03/23 0753  TempSrc: Oral  PainSc: 0-No pain      Patients Stated Pain Goal: 5 (12/03/23 0753)  Complications: No notable events documented.

## 2023-12-04 ENCOUNTER — Encounter (HOSPITAL_BASED_OUTPATIENT_CLINIC_OR_DEPARTMENT_OTHER): Payer: Self-pay | Admitting: General Surgery

## 2023-12-05 LAB — SURGICAL PATHOLOGY

## 2023-12-09 ENCOUNTER — Encounter: Payer: Self-pay | Admitting: General Practice

## 2023-12-09 NOTE — Progress Notes (Signed)
 [facility] Spiritual Care Note  Left voicemail for Tamara Lutz [facility] to review Support Center team/resources.   She completed SDOH screening; results follow below.    SDOH Screenings   Food Insecurity: No Food Insecurity (12/09/2023)  Housing: Low Risk  (12/09/2023)  Transportation Needs: No Transportation Needs (12/09/2023)  Utilities: Not At Risk (12/09/2023)  Depression (PHQ2-9): Low Risk  (06/24/2023)  Tobacco Use: Low Risk  (12/03/2023)    Follow up needed: No. LVM about availability of Spiritual Care and Patient and Family Support programming/resources, encouraging callback if helpful to connect.   [address] [place], [place], [facility] Pager [phone] Voicemail [phone]

## 2023-12-10 ENCOUNTER — Telehealth: Payer: Self-pay | Admitting: *Deleted

## 2023-12-10 ENCOUNTER — Encounter: Payer: Self-pay | Admitting: *Deleted

## 2023-12-10 NOTE — Telephone Encounter (Signed)
 Received order for Oncotype Testing per Dr. Al Pimple. Requisition faxed to pathology

## 2023-12-16 ENCOUNTER — Encounter: Payer: Self-pay | Admitting: Physical Therapy

## 2023-12-17 ENCOUNTER — Telehealth: Payer: Self-pay | Admitting: *Deleted

## 2023-12-17 ENCOUNTER — Encounter: Payer: Self-pay | Admitting: *Deleted

## 2023-12-17 DIAGNOSIS — C50412 Malignant neoplasm of upper-outer quadrant of left female breast: Secondary | ICD-10-CM

## 2023-12-17 NOTE — Telephone Encounter (Signed)
 Received oncotype results of 15/4%. Left message for a return phone call. Referral placed for Dr. Roselind Messier.

## 2023-12-18 ENCOUNTER — Telehealth: Payer: Self-pay | Admitting: Radiation Oncology

## 2023-12-18 ENCOUNTER — Encounter (HOSPITAL_COMMUNITY): Payer: Self-pay

## 2023-12-18 NOTE — Telephone Encounter (Signed)
 Called patient to schedule consultation w. Dr. Roselind Messier. No answer, LVM for a return call.

## 2023-12-23 ENCOUNTER — Encounter: Payer: Self-pay | Admitting: *Deleted

## 2023-12-23 ENCOUNTER — Encounter: Payer: Self-pay | Admitting: Physical Therapy

## 2023-12-23 ENCOUNTER — Ambulatory Visit: Attending: General Surgery | Admitting: Physical Therapy

## 2023-12-23 DIAGNOSIS — Z170 Estrogen receptor positive status [ER+]: Secondary | ICD-10-CM | POA: Diagnosis present

## 2023-12-23 DIAGNOSIS — R293 Abnormal posture: Secondary | ICD-10-CM | POA: Insufficient documentation

## 2023-12-23 DIAGNOSIS — C50412 Malignant neoplasm of upper-outer quadrant of left female breast: Secondary | ICD-10-CM | POA: Diagnosis present

## 2023-12-23 DIAGNOSIS — Z483 Aftercare following surgery for neoplasm: Secondary | ICD-10-CM | POA: Diagnosis present

## 2023-12-23 NOTE — Therapy (Unsigned)
 OUTPATIENT PHYSICAL THERAPY BREAST CANCER POST OP FOLLOW UP   Patient Name: Tamara Lutz MRN: [id number] DOB:[birth year], 63 y.o., female Today's Date: 12/25/2023  END OF SESSION: Visit number 2 of 2 PT start time 1402 PT end time 1457 Activity tolerance: Pt tolerated treatment well Behavior during therapy: [facility] for tasks assessed/performed   Past Medical History:  Diagnosis Date   Allergy    Basal cell carcinoma Jun 04, 1962   nod- upper right chest(CX35FU)   Breast cancer (HCC) 10/2023   left breast IDC   Headache    Hyperlipidemia    Osteopenia    Past Surgical History:  Procedure Laterality Date   BREAST BIOPSY Left 10/18/2023   [place] LT BREAST BX W LOC DEV 1ST LESION IMG BX SPEC US GUIDE 10/18/2023 [facility]   BREAST BIOPSY  12/02/2023   MM LT RADIOACTIVE SEED LOC MAMMO GUIDE 12/02/2023 [facility] MAMMOGRAPHY   BREAST LUMPECTOMY WITH RADIOACTIVE SEED AND SENTINEL LYMPH NODE BIOPSY Left 12/03/2023   Procedure: LEFT BREAST SEED GUIDED LUMPECTOMY, LEFT AXILLARY SENTINEL NODE BIOPSY;  Surgeon: Emelia Loron, MD;  Location: [facility];  Service: General;  Laterality: Left;   CESAREAN SECTION     CESAREAN SECTION N/A    [employer] 07/31/2020   COLONOSCOPY  2019   Dr.Gupta   DILATION AND CURETTAGE OF UTERUS     FLEXIBLE SIGMOIDOSCOPY N/A 08/11/2021   Procedure: FLEXIBLE SIGMOIDOSCOPY;  Surgeon: Jeani Hawking, MD;  Location: [facility];  Service: Endoscopy;  Laterality: N/A;   HEMOSTASIS CLIP PLACEMENT  08/11/2021   Procedure: HEMOSTASIS CLIP PLACEMENT;  Surgeon: Jeani Hawking, MD;  Location: [facility];  Service: Endoscopy;;   MOUTH SURGERY     POLYPECTOMY     SCLEROTHERAPY  08/11/2021   Procedure: Susa Day;  Surgeon: Jeani Hawking, MD;  Location: [facility];  Service: Endoscopy;;   Patient Active Problem List   Diagnosis Date Noted   S/P breast biopsy, left 11/08/2023   Genetic testing 11/04/2023   Malignant neoplasm of upper-outer  quadrant of left breast in female, estrogen receptor positive (HCC) 10/24/2023   Encounter for annual physical exam 06/26/2023   H/O colonoscopy with polypectomy    Insomnia 05/24/2021   Osteoporosis 06/02/2019   Migraine without aura 06/02/2019   Hyperlipidemia 06/02/2019   Irritable bowel syndrome 06/02/2019    REFERRING PROVIDER: Dr. Emelia Loron  REFERRING DIAG: Left breast cancer  THERAPY DIAG:  Malignant neoplasm of upper-outer quadrant of left breast in female, estrogen receptor positive (HCC)  Abnormal posture  Aftercare following surgery for neoplasm  Rationale for Evaluation and Treatment: Rehabilitation  ONSET DATE: 12/03/2023  SUBJECTIVE:  SUBJECTIVE STATEMENT: Patient reports she underwent a left lumpectomy and sentinel node biopsy (3 negative nodes) on 12/03/2023.Her Oncotype score was low so no chemo is needed. She will proceed to radiation and anti-estrogen therapy.  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 She underwent a left lumpectomy and sentinel node biopsy (3 negative nodes) on 12/03/2023. It is ER/PR positive and HER2 negative with a Ki67 of 10%. 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   PATIENT GOALS:  Reassess how my recovery is going related to arm function, pain, and swelling.  PAIN:  Are you having pain? Yes: NPRS scale: 5/10 Pain location: left inferior breast Pain description: tenderness Aggravating factors: rubbing against bra Relieving factors: feels best when she wakes up  PRECAUTIONS: Recent Surgery, left UE Lymphedema risk  RED FLAGS: None   ACTIVITY LEVEL / LEISURE: She does yoga 1-2x/week, uses her stepper 12 min/day, and walks/dances some    OBJECTIVE:    PATIENT SURVEYS:  QUICK DASH:  18.18 - she is primarily limited due to not having yet tried certain tasks such as heavy household chores.   OBSERVATIONS: Incisions on left breast and axilla are covered by steri-strips but appear to be healing well. There is mild diffuse swelling throughout her left breast. Looked at new sports bras she bought and encouraged her to begin wearing that instead as her compression bra is uncomfortable. No axillary cording present.  POSTURE:  Forward head and rounded shoulders  LYMPHEDEMA ASSESSMENT:   UPPER EXTREMITY AROM/PROM:   A/PROM RIGHT   eval    Shoulder extension 50  Shoulder flexion 160  Shoulder abduction 164  Shoulder internal rotation 69  Shoulder external rotation 90                          (Blank rows = not tested)   A/PROM LEFT   eval LEFT 12/23/2023  Shoulder extension 50 45  Shoulder flexion 151 135  Shoulder abduction 160 157  Shoulder internal rotation 63 60  Shoulder external rotation 90 90                          (Blank rows = not tested)   CERVICAL AROM: All within normal limits   UPPER EXTREMITY STRENGTH: WNL   LYMPHEDEMA ASSESSMENTS (in cm):    LANDMARK RIGHT   eval RIGHT 12/23/2023  10 cm proximal to olecranon process 27 27  Olecranon process 22.4 22.5  10 cm proximal to ulnar styloid process 19.3 18.6  Just proximal to ulnar styloid process 13.2 13  Across hand at thumb web space 17.2 17  At base of 2nd digit 6 5.8  (Blank rows = not tested)   LANDMARK LEFT   eval LEFT 317/2025  10 cm proximal to olecranon process 25.9 27.2  Olecranon process 22 22  10  cm proximal to ulnar styloid process 18 17.8  Just proximal to ulnar styloid process 12.8 12.5  Across hand at thumb web space 17.2 16.8  At base of 2nd digit 6 5.8  (Blank rows = not tested)  Surgery type/Date: 12/03/2023 - left lumpectomy and sentinel node biopsy Number of lymph nodes removed: 3 Current/past treatment (chemo, radiation,  hormone therapy): none Other symptoms:  Heaviness/tightness Yes Pain No Pitting edema No Infections No Decreased scar mobility Yes Stemmer sign No  PATIENT EDUCATION:  Education details: HEP  and closed chain shoulder flexion; lymphedema risk reduction; scar massage;importance of regular exercise Person educated: Patient Education method: Explanation, Demonstration, Tactile cues, Verbal cues, and Handouts Education comprehension: verbalized understanding and returned demonstration  HOME EXERCISE PROGRAM: Reviewed previously given post op HEP. Closed chain shoulder flexion  ASSESSMENT:  CLINICAL IMPRESSION: Patient is doing very well s/p left lumpectomy and sentinel node biopsy. She is very relieved she will not need chemo and is ready to begin radiation. Her incisions have healed well. Her compression bra is very uncomfortable per her report she she brought several sports bras for PT to see. We decided she is ready to wear a front zip sports bra which will still provide some compression but not be painful. She has regained nearly full ROM and has no PT needs at this time. She will return for regular SOZO screens.  Pt will benefit from skilled therapeutic intervention to improve on the following deficits: Decreased knowledge of precautions, impaired UE functional use, pain, decreased ROM, postural dysfunction.   PT treatment/interventions: ADL/Self care home management, [phone]- PT Re-evaluation, 97110-Therapeutic exercises, 97530- Therapeutic activity, 97535- Self Care, and 56213- Manual therapy   GOALS: Goals reviewed with patient? Yes  LONG TERM GOALS:  (STG=LTG)  GOALS Name Target Date  Goal status  1 Pt will demonstrate she has regained full shoulder ROM and function post operatively compared to baselines.  Baseline: 12/25/2023 MET     PLAN:  PT FREQUENCY/DURATION: N/A  PLAN FOR NEXT SESSION: N/A   [facility]  [address], [address]  [place]  [place] [address]  [phone]  After Breast Cancer Class Video It is recommended you view the ABC class video to be educated on lymphedema risk reduction. This video lasts for about 30 minutes. It can be viewed on our website here: [email or web address]  Scar massage ONCE THE STERI-STRIPS COME OFF - You can begin gentle scar massage to you incision sites. Gently place one hand on the incision and move the skin (without sliding on the skin) in various directions. Do this for a few minutes and then you can gently massage either coconut oil or vitamin E cream into the scars.  Compression garment You should continue wearing your compression bra until you feel like you no longer have swelling. YOU'RE READY TO START USING YOUR SPORTS BRA.  Home exercise Program Continue doing the exercises you were given until you feel like you can do them without feeling any tightness at the end. YOU CAN START DOING THE FIRST AND LAST EXERCISE ON YOUR PURPLE PAPER LAYING FLAT ON YOUR BACK.  Walking Program Studies show that 30 minutes of walking per day (fast enough to elevate your heart rate) can significantly reduce the risk of a cancer recurrence. If you can't walk due to other medical reasons, we encourage you to find another activity you could do (like a stationary bike or water exercise).  Posture After breast cancer surgery, people frequently sit with rounded shoulders posture because it puts their incisions on slack and feels better. If you sit like this and scar tissue forms in that position, you can become very tight and have pain sitting or standing with good posture. Try to be aware of your posture and sit and stand up tall to heal properly.  Follow up PT: It is recommended you return every 3 months for the first 3 years following surgery to be assessed on the SOZO machine for an L-Dex score. This helps prevent clinically significant lymphedema  in  95% of patients. These follow up screens are 10 minute appointments that you are not billed for.  Bethann Punches, [place] 12/25/23 9:04 AM  PHYSICAL THERAPY DISCHARGE SUMMARY  Visits from Start of Care: 2  Current functional level related to goals / functional outcomes: See above for objective findings   Remaining deficits: Mild swelling left breast   Education / Equipment: HEP and lymphedema risk reduction; scar massage   Patient agrees to discharge. Patient goals were met. Patient is being discharged due to meeting the stated rehab goals.  Bethann Punches, [place] 12/25/23 9:11 AM

## 2023-12-23 NOTE — Patient Instructions (Addendum)
 Closed Chain: Shoulder Flexion / Extension - on Wall    Hands on wall, step backward. Return. Stepping causes shoulder flexion and extension Do _5__ times, holding 5 seconds, _2__ times per day.  http://ss.exer.us/265   Copyright  VHI. All rights reserved.   [facility]  [address]  [place] [address]  [phone]  After Breast Cancer Class Video It is recommended you view the ABC class video to be educated on lymphedema risk reduction. This video lasts for about 30 minutes. It can be viewed on our website here: [email or web address]  Scar massage ONCE THE STERI-STRIPS COME OFF - You can begin gentle scar massage to you incision sites. Gently place one hand on the incision and move the skin (without sliding on the skin) in various directions. Do this for a few minutes and then you can gently massage either coconut oil or vitamin E cream into the scars.  Compression garment You should continue wearing your compression bra until you feel like you no longer have swelling. YOU'RE READY TO START USING YOUR SPORTS BRA.  Home exercise Program Continue doing the exercises you were given until you feel like you can do them without feeling any tightness at the end. YOU CAN START DOING THE FIRST AND LAST EXERCISE ON YOUR PURPLE PAPER LAYING FLAT ON YOUR BACK.  Walking Program Studies show that 30 minutes of walking per day (fast enough to elevate your heart rate) can significantly reduce the risk of a cancer recurrence. If you can't walk due to other medical reasons, we encourage you to find another activity you could do (like a stationary bike or water exercise).  Posture After breast cancer surgery, people frequently sit with rounded shoulders posture because it puts their incisions on slack and feels better. If you sit like this and scar tissue forms in that position, you can become very  tight and have pain sitting or standing with good posture. Try to be aware of your posture and sit and stand up tall to heal properly.  Follow up PT: It is recommended you return every 3 months for the first 3 years following surgery to be assessed on the SOZO machine for an L-Dex score. This helps prevent clinically significant lymphedema in 95% of patients. These follow up screens are 10 minute appointments that you are not billed for.

## 2024-01-01 NOTE — Progress Notes (Incomplete)
 Location of Breast Cancer:left    Histology per Pathology Report:    Receptor Status: ER(+), PR (+), Her2-neu (-), Ki-67(10)  Did patient present with symptoms (if so, please note symptoms) or was this found on screening mammography?: Mammogram  Past/Anticipated interventions by surgeon, if ZOX:WRUE    Past/Anticipated interventions by medical oncology, if any:    Lymphedema issues, if any:  No   Pain issues, if any:  Denies   SAFETY ISSUES: Prior radiation? no Pacemaker/ICD? no Possible current pregnancy?no Is the patient on methotrexate? no   She continues to heal from her lumpectomy.  BP (!) 132/95 (BP Location: Right Arm, Patient Position: Sitting)   Pulse 78   Temp (!) 97.5 F (36.4 C) (Temporal)   Resp 18   Ht 5\' 3"  (1.6 m)   Wt 146 lb 4 oz (66.3 kg)   LMP 04/09/2012   SpO2 100%   BMI 25.91 kg/m

## 2024-01-01 NOTE — Progress Notes (Signed)
 Radiation Oncology         [phone]) [phone] ________________________________  Name: Tamara Lutz MRN: [id number]  Date: 01/02/2024  DOB: [birth year]  Re-Evaluation Note  CC: Early, Sung Amabile, NP  Malachy Mood, MD  No diagnosis found.  Diagnosis: The encounter diagnosis was Malignant neoplasm of upper-outer quadrant of left breast in female, estrogen receptor positive (HCC).   Stage IA (cT1b, cN0, cM0) Left Breast UOQ, Invasive and in situ ductal carcinoma, ER+ / PR+ / Her2-, Grade 2   Narrative:  The patient returns today to discuss radiation treatment options. She was seen in the multidisciplinary breast clinic on 10-30-23.   Since consultation, she underwent genetic testing on 10-30-23. Results showed no significant genetic mutation that can be the underlying cause of her recent diagnosis.  She presented for a consultation with Dr. Mosetta Putt on 10-30-23 during which she  recommend adjuvant endocrine therapy with aromatase inhibitor or tamoxifen for a total of 5-10 years to reduce the risk of cancer recurrence. Later that day, she presented to Dr. Dwain Sarna for a surgery consultation.   She opted to proceed with left breast seed guided lumpectomy w left axillary nodal biopsy on 12-03-23 under the care of Dr. Dwain Sarna. Pathology from the procedure revealed: grade 2, invasive moderately differentiated ductal adenocarcinoma. Tumor size of 1.1 x 0.7 x 0.6 cm. All margins negative for invasive diseases, closest margin to tumor 7.5 mm from medial margin; final medial margin at least 23.5 mm. Estrogen Receptor: Positive with 1%, strong staining; Progesterone Receptor: Positive 80%, moderate to strong staining; HER2: Negative (1+); Ki-67: 10%. Nodal biopsies all negative for carcinoma.     Oncotype DX was obtained on the final surgical sample and the recurrence score of 15 predicts a risk of recurrence outside the breast over the next 9 years of 4%, if the patient's only systemic therapy is an antiestrogen  for 5 years.  It also predicts no significant benefit from chemotherapy.  Patient presented for a post-op follow up with Dr. Dwain Sarna on 12-25-23 during which she reported that her axillary and breast incisions both healing well without any have any evidence of infection.    On review of systems, the patient reports ***. She denies *** and any other symptoms.    Allergies:  is allergic to gabapentin.  Meds: Current Outpatient Medications  Medication Sig Dispense Refill   aspirin-acetaminophen-caffeine (EXCEDRIN MIGRAINE) 250-250-65 MG tablet Take by mouth every 6 (six) hours as needed for headache.     Calcium Carb-Cholecalciferol 600-400 MG-UNIT CAPS Take 1 capsule by mouth.     cetirizine (ZYRTEC) 10 MG chewable tablet Chew 10 mg by mouth daily.     Cholecalciferol (VITAMIN D) 50 MCG (2000 UT) CAPS Take by mouth.     fluticasone (FLONASE) 50 MCG/ACT nasal spray Place 2 sprays into both nostrils daily. 16 g 6   ibandronate (BONIVA) 150 MG tablet Take 150 mg by mouth every 30 (thirty) days. Take in the morning with a full glass of water, on an empty stomach, and do not take anything else by mouth or lie down for the next 30 min.     Magnesium 500 MG CAPS Take by mouth.     montelukast (SINGULAIR) 10 MG tablet Take 1 tablet (10 mg total) by mouth at bedtime. 90 tablet 0   Multiple Vitamin (MULTIVITAMIN WITH MINERALS) TABS tablet Take 1 tablet by mouth daily.     Multiple Vitamin (MULTIVITAMIN) tablet Take 1 tablet by mouth daily.  oxyCODONE (OXY IR/ROXICODONE) 5 MG immediate release tablet Take 1 tablet (5 mg total) by mouth every 6 (six) hours as needed. 10 tablet 0   rizatriptan (MAXALT) 10 MG tablet Take 10 mg (1 tablet total) by mouth at the start of the headache. May repeat in 2 hours x 1 if headache persists. Max of 2 tablets/24 hours. 12 tablet 6   zaleplon (SONATA) 10 MG capsule Take 10 mg by mouth at bedtime as needed for sleep.     No current facility-administered medications  for this encounter.    Physical Findings: The patient is in no acute distress. Patient is alert and oriented.  vitals were not taken for this visit.  No significant changes. Lungs are clear to auscultation bilaterally. Heart has regular rate and rhythm. No palpable cervical, supraclavicular, or axillary adenopathy. Abdomen soft, non-tender, normal bowel sounds. *** Breast: no palpable mass, nipple discharge or bleeding. *** Breast: ***  Lab Findings: Lab Results  Component Value Date   WBC 4.2 10/30/2023   HGB 12.8 10/30/2023   HCT 40.1 10/30/2023   MCV 89.1 10/30/2023   PLT 247 10/30/2023    Radiographic Findings: MM Breast Surgical Specimen Result Date: 12/03/2023 CLINICAL DATA:  Evaluate surgical specimen following lumpectomy for LEFT breast cancer. EXAM: SPECIMEN RADIOGRAPH OF THE LEFT BREAST COMPARISON:  Previous exam(s). FINDINGS: Status post excision of the LEFT breast. The radioactive seed and RIBBON biopsy clip are present and intact. IMPRESSION: Specimen radiograph of the LEFT breast. Electronically Signed   By: Harmon Pier M.D.   On: 12/03/2023 13:24    Impression:  Stage IA (cT1b, cN0, cM0) Left Breast UOQ, Invasive and in situ ductal carcinoma, ER+ / PR+ / Her2-, Grade 2   ***  Plan:  Patient is scheduled for CT simulation {date/later today}. ***  -----------------------------------  Billie Lade, PhD, MD   This document serves as a record of services personally performed by Antony Blackbird, MD. It was created on his behalf by Herbie Saxon, a trained medical scribe. The creation of this record is based on the scribe's personal observations and the provider's statements to them. This document has been checked and approved by the attending provider.

## 2024-01-02 ENCOUNTER — Ambulatory Visit
Admission: RE | Admit: 2024-01-02 | Discharge: 2024-01-02 | Disposition: A | Discharge status: Home / Self Care | Source: Ambulatory Visit | Attending: Radiation Oncology | Admitting: Radiation Oncology

## 2024-01-02 ENCOUNTER — Encounter: Payer: Self-pay | Admitting: Radiation Oncology

## 2024-01-02 VITALS — BP 132/95 | HR 78 | Temp 97.5°F | Resp 18 | Ht 63.0 in | Wt 146.2 lb

## 2024-01-02 DIAGNOSIS — Z170 Estrogen receptor positive status [ER+]: Secondary | ICD-10-CM

## 2024-01-02 DIAGNOSIS — Z510 Encounter for antineoplastic radiation therapy: Secondary | ICD-10-CM | POA: Insufficient documentation

## 2024-01-02 DIAGNOSIS — Z1721 Progesterone receptor positive status: Secondary | ICD-10-CM | POA: Insufficient documentation

## 2024-01-02 DIAGNOSIS — C50412 Malignant neoplasm of upper-outer quadrant of left female breast: Secondary | ICD-10-CM | POA: Insufficient documentation

## 2024-01-02 DIAGNOSIS — Z79899 Other long term (current) drug therapy: Secondary | ICD-10-CM | POA: Insufficient documentation

## 2024-01-02 DIAGNOSIS — Z1732 Human epidermal growth factor receptor 2 negative status: Secondary | ICD-10-CM | POA: Insufficient documentation

## 2024-01-07 ENCOUNTER — Ambulatory Visit
Admission: RE | Admit: 2024-01-07 | Discharge: 2024-01-07 | Disposition: A | Discharge status: Home / Self Care | Source: Ambulatory Visit | Attending: Radiation Oncology | Admitting: Radiation Oncology

## 2024-01-07 DIAGNOSIS — Z1732 Human epidermal growth factor receptor 2 negative status: Secondary | ICD-10-CM | POA: Diagnosis not present

## 2024-01-07 DIAGNOSIS — M810 Age-related osteoporosis without current pathological fracture: Secondary | ICD-10-CM | POA: Diagnosis not present

## 2024-01-07 DIAGNOSIS — Z170 Estrogen receptor positive status [ER+]: Secondary | ICD-10-CM | POA: Diagnosis not present

## 2024-01-07 DIAGNOSIS — G4700 Insomnia, unspecified: Secondary | ICD-10-CM | POA: Diagnosis not present

## 2024-01-07 DIAGNOSIS — Z1721 Progesterone receptor positive status: Secondary | ICD-10-CM | POA: Diagnosis not present

## 2024-01-07 DIAGNOSIS — Z510 Encounter for antineoplastic radiation therapy: Secondary | ICD-10-CM | POA: Diagnosis present

## 2024-01-07 DIAGNOSIS — C50412 Malignant neoplasm of upper-outer quadrant of left female breast: Secondary | ICD-10-CM | POA: Insufficient documentation

## 2024-01-09 ENCOUNTER — Other Ambulatory Visit: Payer: Self-pay

## 2024-01-09 ENCOUNTER — Encounter: Payer: Self-pay | Admitting: *Deleted

## 2024-01-09 DIAGNOSIS — Z510 Encounter for antineoplastic radiation therapy: Secondary | ICD-10-CM | POA: Diagnosis not present

## 2024-01-09 DIAGNOSIS — C50412 Malignant neoplasm of upper-outer quadrant of left female breast: Secondary | ICD-10-CM

## 2024-01-09 LAB — RAD ONC ARIA SESSION SUMMARY
Course Elapsed Days: 0
Plan Fractions Treated to Date: 1
Plan Prescribed Dose Per Fraction: 2.67 Gy
Plan Total Fractions Prescribed: 16
Plan Total Prescribed Dose: 42.72 Gy
Reference Point Dosage Given to Date: 2.67 Gy
Reference Point Session Dosage Given: 2.67 Gy
Session Number: 1

## 2024-01-10 ENCOUNTER — Other Ambulatory Visit: Payer: Self-pay

## 2024-01-10 ENCOUNTER — Telehealth: Payer: Self-pay | Admitting: Nurse Practitioner

## 2024-01-10 ENCOUNTER — Ambulatory Visit
Admission: RE | Admit: 2024-01-10 | Discharge: 2024-01-10 | Disposition: A | Discharge status: Home / Self Care | Source: Ambulatory Visit | Attending: Radiation Oncology | Admitting: Radiation Oncology

## 2024-01-10 DIAGNOSIS — Z510 Encounter for antineoplastic radiation therapy: Secondary | ICD-10-CM | POA: Diagnosis not present

## 2024-01-10 LAB — RAD ONC ARIA SESSION SUMMARY
Course Elapsed Days: 1
Plan Fractions Treated to Date: 2
Plan Prescribed Dose Per Fraction: 2.67 Gy
Plan Total Fractions Prescribed: 16
Plan Total Prescribed Dose: 42.72 Gy
Reference Point Dosage Given to Date: 5.34 Gy
Reference Point Session Dosage Given: 2.67 Gy
Session Number: 2

## 2024-01-10 NOTE — Telephone Encounter (Signed)
 Left a voicemail with appointment details and will be mailed a reminder.

## 2024-01-13 ENCOUNTER — Ambulatory Visit
Admission: RE | Admit: 2024-01-13 | Discharge: 2024-01-13 | Disposition: A | Discharge status: Home / Self Care | Source: Ambulatory Visit | Attending: Radiation Oncology | Admitting: Radiation Oncology

## 2024-01-13 ENCOUNTER — Other Ambulatory Visit: Payer: Self-pay

## 2024-01-13 DIAGNOSIS — Z510 Encounter for antineoplastic radiation therapy: Secondary | ICD-10-CM | POA: Diagnosis not present

## 2024-01-13 LAB — RAD ONC ARIA SESSION SUMMARY
Course Elapsed Days: 4
Plan Fractions Treated to Date: 3
Plan Prescribed Dose Per Fraction: 2.67 Gy
Plan Total Fractions Prescribed: 16
Plan Total Prescribed Dose: 42.72 Gy
Reference Point Dosage Given to Date: 8.01 Gy
Reference Point Session Dosage Given: 2.67 Gy
Session Number: 3

## 2024-01-14 ENCOUNTER — Other Ambulatory Visit: Payer: Self-pay

## 2024-01-14 ENCOUNTER — Ambulatory Visit
Admission: RE | Admit: 2024-01-14 | Discharge: 2024-01-14 | Disposition: A | Discharge status: Home / Self Care | Source: Ambulatory Visit | Attending: Radiation Oncology | Admitting: Radiation Oncology

## 2024-01-14 DIAGNOSIS — Z170 Estrogen receptor positive status [ER+]: Secondary | ICD-10-CM

## 2024-01-14 DIAGNOSIS — Z510 Encounter for antineoplastic radiation therapy: Secondary | ICD-10-CM | POA: Diagnosis not present

## 2024-01-14 LAB — RAD ONC ARIA SESSION SUMMARY
Course Elapsed Days: 5
Plan Fractions Treated to Date: 4
Plan Prescribed Dose Per Fraction: 2.67 Gy
Plan Total Fractions Prescribed: 16
Plan Total Prescribed Dose: 42.72 Gy
Reference Point Dosage Given to Date: 10.68 Gy
Reference Point Session Dosage Given: 2.67 Gy
Session Number: 4

## 2024-01-14 MED ORDER — ALRA NON-METALLIC DEODORANT (RAD-ONC)
1.0000 | Freq: Once | TOPICAL | Status: AC
  Administered 2024-01-14: 1 via TOPICAL

## 2024-01-14 MED ORDER — RADIAPLEXRX EX GEL: Freq: Once | CUTANEOUS | Status: AC

## 2024-01-14 NOTE — Progress Notes (Signed)
Pt here for patient teaching.    Pt given Radiation and You booklet, skin care instructions, Alra deodorant, and Radiaplex gel.    Reviewed areas of pertinence such as fatigue, hair loss in treatment field, skin changes, breast tenderness, and breast swelling .   Pt able to give teach back of to pat skin, use unscented/gentle soap, and drink plenty of water,apply Radiaplex bid, avoid applying anything to skin within 4 hours of treatment, avoid wearing an under wire bra, and to use an electric razor if they must shave.   Pt verbalizes understanding of information given and will contact nursing with any questions or concerns.

## 2024-01-15 ENCOUNTER — Other Ambulatory Visit: Payer: Self-pay

## 2024-01-15 ENCOUNTER — Ambulatory Visit
Admission: RE | Admit: 2024-01-15 | Discharge: 2024-01-15 | Disposition: A | Discharge status: Home / Self Care | Source: Ambulatory Visit | Attending: Radiation Oncology | Admitting: Radiation Oncology

## 2024-01-15 DIAGNOSIS — Z510 Encounter for antineoplastic radiation therapy: Secondary | ICD-10-CM | POA: Diagnosis not present

## 2024-01-15 LAB — RAD ONC ARIA SESSION SUMMARY
Course Elapsed Days: 6
Plan Fractions Treated to Date: 5
Plan Prescribed Dose Per Fraction: 2.67 Gy
Plan Total Fractions Prescribed: 16
Plan Total Prescribed Dose: 42.72 Gy
Reference Point Dosage Given to Date: 13.35 Gy
Reference Point Session Dosage Given: 2.67 Gy
Session Number: 5

## 2024-01-16 ENCOUNTER — Ambulatory Visit
Admission: RE | Admit: 2024-01-16 | Discharge: 2024-01-16 | Discharge status: Home / Self Care | Source: Ambulatory Visit | Attending: Radiation Oncology | Admitting: Radiation Oncology

## 2024-01-16 ENCOUNTER — Other Ambulatory Visit: Payer: Self-pay

## 2024-01-16 DIAGNOSIS — Z510 Encounter for antineoplastic radiation therapy: Secondary | ICD-10-CM | POA: Diagnosis not present

## 2024-01-16 LAB — RAD ONC ARIA SESSION SUMMARY
Course Elapsed Days: 7
Plan Fractions Treated to Date: 6
Plan Prescribed Dose Per Fraction: 2.67 Gy
Plan Total Fractions Prescribed: 16
Plan Total Prescribed Dose: 42.72 Gy
Reference Point Dosage Given to Date: 16.02 Gy
Reference Point Session Dosage Given: 2.67 Gy
Session Number: 6

## 2024-01-17 ENCOUNTER — Ambulatory Visit
Admission: RE | Admit: 2024-01-17 | Discharge: 2024-01-17 | Disposition: A | Discharge status: Home / Self Care | Source: Ambulatory Visit | Attending: Radiation Oncology | Admitting: Radiation Oncology

## 2024-01-17 ENCOUNTER — Other Ambulatory Visit: Payer: Self-pay

## 2024-01-17 DIAGNOSIS — Z510 Encounter for antineoplastic radiation therapy: Secondary | ICD-10-CM | POA: Diagnosis not present

## 2024-01-17 LAB — RAD ONC ARIA SESSION SUMMARY
Course Elapsed Days: 8
Plan Fractions Treated to Date: 7
Plan Prescribed Dose Per Fraction: 2.67 Gy
Plan Total Fractions Prescribed: 16
Plan Total Prescribed Dose: 42.72 Gy
Reference Point Dosage Given to Date: 18.69 Gy
Reference Point Session Dosage Given: 2.67 Gy
Session Number: 7

## 2024-01-20 ENCOUNTER — Other Ambulatory Visit: Payer: Self-pay

## 2024-01-20 ENCOUNTER — Ambulatory Visit
Admission: RE | Admit: 2024-01-20 | Discharge: 2024-01-20 | Disposition: A | Discharge status: Home / Self Care | Source: Ambulatory Visit | Attending: Radiation Oncology | Admitting: Radiation Oncology

## 2024-01-20 DIAGNOSIS — Z510 Encounter for antineoplastic radiation therapy: Secondary | ICD-10-CM | POA: Diagnosis not present

## 2024-01-20 LAB — RAD ONC ARIA SESSION SUMMARY
Course Elapsed Days: 11
Plan Fractions Treated to Date: 8
Plan Prescribed Dose Per Fraction: 2.67 Gy
Plan Total Fractions Prescribed: 16
Plan Total Prescribed Dose: 42.72 Gy
Reference Point Dosage Given to Date: 21.36 Gy
Reference Point Session Dosage Given: 2.67 Gy
Session Number: 8

## 2024-01-21 ENCOUNTER — Ambulatory Visit

## 2024-01-21 ENCOUNTER — Other Ambulatory Visit: Payer: Self-pay

## 2024-01-21 ENCOUNTER — Ambulatory Visit
Admission: RE | Admit: 2024-01-21 | Discharge: 2024-01-21 | Disposition: A | Discharge status: Home / Self Care | Source: Ambulatory Visit | Attending: Radiation Oncology | Admitting: Radiation Oncology

## 2024-01-21 DIAGNOSIS — Z510 Encounter for antineoplastic radiation therapy: Secondary | ICD-10-CM | POA: Diagnosis not present

## 2024-01-21 LAB — RAD ONC ARIA SESSION SUMMARY
Course Elapsed Days: 12
Plan Fractions Treated to Date: 9
Plan Prescribed Dose Per Fraction: 2.67 Gy
Plan Total Fractions Prescribed: 16
Plan Total Prescribed Dose: 42.72 Gy
Reference Point Dosage Given to Date: 24.03 Gy
Reference Point Session Dosage Given: 2.67 Gy
Session Number: 9

## 2024-01-22 ENCOUNTER — Other Ambulatory Visit: Payer: Self-pay

## 2024-01-22 ENCOUNTER — Ambulatory Visit
Admission: RE | Admit: 2024-01-22 | Discharge: 2024-01-22 | Disposition: A | Discharge status: Home / Self Care | Source: Ambulatory Visit | Attending: Radiation Oncology | Admitting: Radiation Oncology

## 2024-01-22 DIAGNOSIS — Z510 Encounter for antineoplastic radiation therapy: Secondary | ICD-10-CM | POA: Diagnosis not present

## 2024-01-22 LAB — RAD ONC ARIA SESSION SUMMARY
Course Elapsed Days: 13
Plan Fractions Treated to Date: 10
Plan Prescribed Dose Per Fraction: 2.67 Gy
Plan Total Fractions Prescribed: 16
Plan Total Prescribed Dose: 42.72 Gy
Reference Point Dosage Given to Date: 26.7 Gy
Reference Point Session Dosage Given: 2.67 Gy
Session Number: 10

## 2024-01-23 ENCOUNTER — Other Ambulatory Visit: Payer: Self-pay

## 2024-01-23 ENCOUNTER — Ambulatory Visit
Admission: RE | Admit: 2024-01-23 | Discharge: 2024-01-23 | Disposition: A | Discharge status: Home / Self Care | Source: Ambulatory Visit | Attending: Radiation Oncology | Admitting: Radiation Oncology

## 2024-01-23 DIAGNOSIS — Z510 Encounter for antineoplastic radiation therapy: Secondary | ICD-10-CM | POA: Diagnosis not present

## 2024-01-23 LAB — RAD ONC ARIA SESSION SUMMARY
Course Elapsed Days: 14
Plan Fractions Treated to Date: 11
Plan Prescribed Dose Per Fraction: 2.67 Gy
Plan Total Fractions Prescribed: 16
Plan Total Prescribed Dose: 42.72 Gy
Reference Point Dosage Given to Date: 29.37 Gy
Reference Point Session Dosage Given: 2.67 Gy
Session Number: 11

## 2024-01-24 ENCOUNTER — Other Ambulatory Visit: Payer: Self-pay

## 2024-01-24 ENCOUNTER — Ambulatory Visit
Admission: RE | Admit: 2024-01-24 | Discharge: 2024-01-24 | Disposition: A | Discharge status: Home / Self Care | Source: Ambulatory Visit | Attending: Radiation Oncology | Admitting: Radiation Oncology

## 2024-01-24 DIAGNOSIS — Z510 Encounter for antineoplastic radiation therapy: Secondary | ICD-10-CM | POA: Diagnosis not present

## 2024-01-24 LAB — RAD ONC ARIA SESSION SUMMARY
Course Elapsed Days: 15
Plan Fractions Treated to Date: 12
Plan Prescribed Dose Per Fraction: 2.67 Gy
Plan Total Fractions Prescribed: 16
Plan Total Prescribed Dose: 42.72 Gy
Reference Point Dosage Given to Date: 32.04 Gy
Reference Point Session Dosage Given: 2.67 Gy
Session Number: 12

## 2024-01-27 ENCOUNTER — Other Ambulatory Visit: Payer: Self-pay

## 2024-01-27 ENCOUNTER — Ambulatory Visit
Admission: RE | Admit: 2024-01-27 | Discharge: 2024-01-27 | Disposition: A | Discharge status: Home / Self Care | Source: Ambulatory Visit | Attending: Radiation Oncology | Admitting: Radiation Oncology

## 2024-01-27 DIAGNOSIS — Z510 Encounter for antineoplastic radiation therapy: Secondary | ICD-10-CM | POA: Diagnosis not present

## 2024-01-27 LAB — RAD ONC ARIA SESSION SUMMARY
Course Elapsed Days: 18
Plan Fractions Treated to Date: 13
Plan Prescribed Dose Per Fraction: 2.67 Gy
Plan Total Fractions Prescribed: 16
Plan Total Prescribed Dose: 42.72 Gy
Reference Point Dosage Given to Date: 34.71 Gy
Reference Point Session Dosage Given: 2.67 Gy
Session Number: 13

## 2024-01-27 NOTE — Assessment & Plan Note (Signed)
 pT1cN0M0 stage IA, invasive ductal carcinoma, grade 2, ER 1%+, PR 80%+, HER2(-) - Diagnosed in January 2025 -Status post left breast lumpectomy and sentinel lymph node biopsy -Oncotype Dx recurrence score 15, no benefit for adjuvant chemotherapy -On adjuvant RT -I recommend adjuvant AI

## 2024-01-28 ENCOUNTER — Other Ambulatory Visit: Payer: Self-pay

## 2024-01-28 ENCOUNTER — Ambulatory Visit
Admission: RE | Admit: 2024-01-28 | Discharge: 2024-01-28 | Disposition: A | Discharge status: Home / Self Care | Source: Ambulatory Visit | Attending: Radiation Oncology | Admitting: Radiation Oncology

## 2024-01-28 ENCOUNTER — Ambulatory Visit
Admission: RE | Admit: 2024-01-28 | Discharge: 2024-01-28 | Discharge status: Home / Self Care | Source: Ambulatory Visit | Attending: Radiation Oncology | Admitting: Radiation Oncology

## 2024-01-28 ENCOUNTER — Inpatient Hospital Stay: Attending: Hematology | Admitting: Hematology

## 2024-01-28 VITALS — BP 124/76 | HR 78 | Temp 98.1°F | Resp 20 | Ht 63.0 in | Wt 148.6 lb

## 2024-01-28 DIAGNOSIS — G4700 Insomnia, unspecified: Secondary | ICD-10-CM | POA: Insufficient documentation

## 2024-01-28 DIAGNOSIS — C50412 Malignant neoplasm of upper-outer quadrant of left female breast: Secondary | ICD-10-CM | POA: Insufficient documentation

## 2024-01-28 DIAGNOSIS — Z170 Estrogen receptor positive status [ER+]: Secondary | ICD-10-CM | POA: Insufficient documentation

## 2024-01-28 DIAGNOSIS — M810 Age-related osteoporosis without current pathological fracture: Secondary | ICD-10-CM | POA: Insufficient documentation

## 2024-01-28 DIAGNOSIS — Z510 Encounter for antineoplastic radiation therapy: Secondary | ICD-10-CM | POA: Insufficient documentation

## 2024-01-28 DIAGNOSIS — Z1721 Progesterone receptor positive status: Secondary | ICD-10-CM | POA: Insufficient documentation

## 2024-01-28 DIAGNOSIS — Z1732 Human epidermal growth factor receptor 2 negative status: Secondary | ICD-10-CM | POA: Insufficient documentation

## 2024-01-28 LAB — RAD ONC ARIA SESSION SUMMARY
Course Elapsed Days: 19
Plan Fractions Treated to Date: 14
Plan Prescribed Dose Per Fraction: 2.67 Gy
Plan Total Fractions Prescribed: 16
Plan Total Prescribed Dose: 42.72 Gy
Reference Point Dosage Given to Date: 37.38 Gy
Reference Point Session Dosage Given: 2.67 Gy
Session Number: 14

## 2024-01-28 MED ORDER — LETROZOLE 2.5 MG PO TABS: 2.5000 mg | ORAL_TABLET | Freq: Every day | ORAL | 3 refills | Status: DC

## 2024-01-28 NOTE — Progress Notes (Signed)
 [facility]   [phone] [phone]   Clinic Follow up Note   Patient Care Team: Early, Adriane Albe, NP as PCP - General (Nurse Practitioner) Kyra Phy, MD as PCP - Cardiology (Cardiology) Merriam Abbey, DO as Consulting Physician (Neurology) Auther Bo, RN as Oncology Nurse Navigator Alane Hsu, RN as Oncology Nurse Navigator Sonja [place], MD as Consulting Physician (Hematology) Retta Caster, MD as Consulting Physician (Radiation Oncology) Enid Harry, MD as Consulting Physician (General Surgery)  Date of Service:  01/28/2024  CHIEF COMPLAINT: f/u of left breast cancer  CURRENT THERAPY:  Adjuvant radiation  Oncology History   Malignant neoplasm of upper-outer quadrant of left breast in female, 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 Tumor size was 1.1 cm, with negative lymph nodes. Oncotype recurrence score was 15, indicating low risk. Anti-estrogen therapy recommended to prevent recurrence, with no chemotherapy needed. Distant recurrence risk is 4% in the next nine years and 6% over her lifetime. If cancer recurs in the bone, liver, or lung, it is not curable, highlighting the importance of therapy. Potential side effects of anti-estrogen therapy include hot flashes, mood swings, joint pain, and metabolic changes.  He agrees with the plan. - Prescribe letrozole  and send prescription to [facility] pharmacy on [address]. - She was started in the first or second week of May. - Instruct to contact the office if experiencing  side effects  - Provide printed information about anastrozole. - Encourage monitoring cholesterol levels twice a year, especially in the first few years. - Advise weight monitoring and maintaining a healthy diet and exercise regimen. - Schedule survivorship appointment in three months with nurse practitioner. - Schedule follow-up appointment in seven months.  Osteoporosis Osteoporosis managed with Boniva. Anti-estrogen therapy may impact bone density, necessitating monitoring. Continuation of Boniva recommended for up to five years, with bone density scans every two years. - Continue Boniva for osteoporosis management. - Ensure bone density scan every two years. - Advise continuation of calcium  and vitamin D  supplementation. - Encourage weight-bearing exercises to strengthen bones.  Insomnia Insomnia with variable response to Solapron. Anti-estrogen therapy may exacerbate insomnia. Gabapentin  previously not tolerated due to drowsiness and discomfort. Alternative options include antidepressants or specific medications for hot flashes. Managing side effects is crucial for adherence to anti-estrogen therapy. - Consider alternative medications for insomnia if exacerbated by anti-estrogen therapy.  Plan - She is tolerating radiation very well, and will completed this week. - I recommend adjuvant aromatase inhibitor, and called in letrozole  to her pharmacy today.  She was started in a few weeks - She is scheduled for survivorship in 4 months - Lab and follow-up in 7 months   SUMMARY OF ONCOLOGIC HISTORY: Oncology History  Malignant neoplasm of upper-outer quadrant of left breast in female, estrogen receptor positive (HCC)  06/26/2018 Genetic Testing   Negative genetic testing on the [facility] panel through [facility]. GALANT1 2 VUS identified. The report date is June 26, 2018.  The [facility] gene panel offered by [employer] includes sequencing and  deletion/duplication testing of the following 35 genes: APC, ATM, AXIN2, BARD1, BMPR1A, BRCA1, BRCA2,  BRIP1, CHD1, CDK4, CDKN2A, CHEK2, EPCAM (large rearrangement only), HOXB13, (sequencing only), GALNT12, MLH1, MSH2, MSH3 (excluding repetitive portions of exon 1), MSH6, MUTYH, NBN, NTHL1, PALB2, PMS2, PTEN, RAD51C, RAD51D, RNF43, RPS20, SMAD4, STK11, and TP53. Sequencing was performed for select regions of POLE and POLD1, and large rearrangement analysis was performed for select regions of GREM1.    10/18/2023 Cancer Staging   Staging form: Breast, AJCC 8th Edition - Clinical stage from 10/18/2023: Stage IA (cT1b, cN0, cM0, G2, ER+, PR+, HER2-) - Signed by Sonja Tennant, MD on 10/29/2023 Stage prefix: Initial diagnosis Histologic grading system: 3 grade system   10/24/2023 Initial Diagnosis   Malignant neoplasm of upper-outer quadrant of left breast in female, estrogen receptor positive (HCC)   12/03/2023 Cancer Staging   Staging form: Breast, AJCC 8th Edition - Pathologic stage from 12/03/2023: Stage IA (pT1c, pN0, cM0, G2, ER+, PR+, HER2-) - Signed by Sonja [place], MD on 01/27/2024 Histologic grading system: 3 grade system Residual tumor (R): R0      Discussed the use of AI scribe software for clinical note transcription with the patient, who gave verbal consent to proceed.  History of Present Illness The patient, a 63 year old female with a history of breast cancer, presents for a follow-up visit. She is nearing the end of her radiation therapy, with only two sessions remaining. She reports mild skin redness and itching as side effects of the therapy, but denies any pain.  The patient underwent surgery on February 25th, during which a 1.1 cm tumor was completely removed. Three lymph nodes were tested and all were negative for cancer. An Oncotype test was performed, yielding a recurrence score of 15, indicating a low risk of recurrence and negating the need for chemotherapy. However, the patient is  recommended to start anti-estrogen therapy.  The patient has a history of severe menopause symptoms, including intense hot flashes, mood swings, body itching, and sleeplessness. She continues to struggle with sleep, even with the use of sleeping pills. She has been prescribed Solapron for sleep, which she reports as being only sometimes effective.  The patient also has a history of osteoporosis, for which she has been taking Boniva for a couple of years. She reports no joint pain or arthritis.     All other systems were reviewed with the patient and are negative.  MEDICAL HISTORY:  Past Medical History:  Diagnosis Date   Allergy    Basal cell carcinoma [birth year] 1962   nod- upper right chest(CX35FU)   Breast cancer (HCC) 10/2023   left breast IDC   Headache    Hyperlipidemia    Osteopenia     SURGICAL HISTORY: Past Surgical History:  Procedure Laterality Date   BREAST BIOPSY Left 10/18/2023   US  LT BREAST BX W LOC DEV 1ST LESION IMG BX SPEC US  GUIDE 10/18/2023 [facility]   BREAST BIOPSY  12/02/2023   MM LT RADIOACTIVE SEED LOC MAMMO GUIDE 12/02/2023 [facility]   BREAST LUMPECTOMY WITH RADIOACTIVE SEED AND SENTINEL LYMPH NODE BIOPSY Left 12/03/2023   Procedure: LEFT BREAST SEED GUIDED LUMPECTOMY, LEFT AXILLARY SENTINEL NODE BIOPSY;  Surgeon: Enid Harry, MD;  Location: [facility];  Service: General;  Laterality: Left;   CESAREAN SECTION     CESAREAN SECTION N/A    Phreesia 07/31/2020   COLONOSCOPY  2019   Dr.Gupta   DILATION AND CURETTAGE OF UTERUS     FLEXIBLE SIGMOIDOSCOPY N/A 08/11/2021   Procedure: FLEXIBLE SIGMOIDOSCOPY;  Surgeon: Alvis Jourdain, MD;  Location: [facility];  Service: Endoscopy;  Laterality: N/A;   HEMOSTASIS CLIP PLACEMENT  08/11/2021   Procedure: HEMOSTASIS CLIP PLACEMENT;  Surgeon: Alvis Jourdain, MD;  Location: [facility];  Service: Endoscopy;;   MOUTH SURGERY     POLYPECTOMY     SCLEROTHERAPY  08/11/2021   Procedure:  Daryle Eon;  Surgeon: Alvis Jourdain, MD;  Location: [facility];  Service: Endoscopy;;    I have reviewed the social history and family history with the patient and they are unchanged from previous note.  ALLERGIES:  is allergic to gabapentin .  MEDICATIONS:  Current Outpatient Medications  Medication Sig Dispense Refill   aspirin-acetaminophen -caffeine (EXCEDRIN MIGRAINE) 250-250-65 MG tablet Take by mouth every 6 (six) hours as needed for headache.     Calcium  Carb-Cholecalciferol 600-400 MG-UNIT CAPS Take 1 capsule by mouth.     cetirizine (ZYRTEC) 10 MG chewable tablet Chew 10 mg by mouth daily.     Cholecalciferol (VITAMIN D ) 50 MCG (2000 UT) CAPS Take by mouth.     fluticasone  (FLONASE ) 50 MCG/ACT nasal spray Place 2 sprays into both nostrils daily. 16 g 6   ibandronate (BONIVA) 150 MG tablet Take 150 mg by mouth every 30 (thirty) days. Take in the morning with a full glass of water, on an empty stomach, and do not take anything else by mouth or lie down for the next 30 min.     letrozole  (FEMARA ) 2.5 MG tablet Take 1 tablet (2.5 mg total) by mouth daily. 30 tablet 3   Magnesium 500 MG CAPS Take by mouth.     montelukast  (SINGULAIR ) 10 MG tablet Take 1 tablet (10 mg total) by mouth at bedtime. 90 tablet 0   Multiple Vitamin (MULTIVITAMIN WITH MINERALS) TABS tablet Take 1 tablet by mouth daily.     Multiple Vitamin (MULTIVITAMIN) tablet Take 1 tablet by mouth daily.     rizatriptan  (MAXALT ) 10 MG tablet Take 10 mg (1 tablet total) by mouth at the start of the headache. May repeat in 2 hours x 1 if headache persists. Max of 2 tablets/24 hours. 12 tablet 6   zaleplon (SONATA) 10 MG capsule Take 10 mg by mouth at bedtime as needed for sleep.     No current facility-administered medications for this visit.    PHYSICAL EXAMINATION: ECOG PERFORMANCE STATUS: 0 - Asymptomatic  Vitals:   01/28/24 1530  BP: 124/76  Pulse: 78  Resp: 20  Temp: 98.1 F (36.7 C)  SpO2: 96%   Wt  Readings from Last 3 Encounters:  01/28/24 148 lb 9.6 oz (67.4 kg)  01/02/24 146 lb 4 oz (66.3 kg)  12/03/23 144 lb 2.9 oz (65.4 kg)     GENERAL:alert, no distress and comfortable SKIN: skin color, texture, turgor are normal, no rashes or significant lesions EYES: normal, Conjunctiva are pink and non-injected, sclera clear NECK: supple, thyroid normal size, non-tender, without nodularity LYMPH:  no palpable lymphadenopathy in the cervical, axillary  LUNGS: clear to auscultation and percussion with normal breathing effort HEART: regular rate & rhythm and no murmurs and no lower extremity edema ABDOMEN:abdomen soft, non-tender and normal bowel sounds Musculoskeletal:no cyanosis of digits and no clubbing  NEURO: alert & oriented x 3 with fluent speech, no focal motor/sensory deficits  Physical Exam   LABORATORY DATA:  I have reviewed the data as listed    Latest Ref Rng & Units 10/30/2023   12:19 PM 06/24/2023    4:15 PM 06/19/2022    9:04 AM  CBC  WBC 4.0 - 10.5 K/uL 4.2  7.6  4.1   Hemoglobin 12.0 - 15.0 g/dL 65.7  84.6  96.2   Hematocrit 36.0 - 46.0 % 40.1  42.2  39.9   Platelets 150 - 400 K/uL 247  274  227         Latest Ref Rng & Units 10/30/2023   12:19 PM 06/24/2023    4:15 PM 06/19/2022    9:04 AM  CMP  Glucose 70 - 99 mg/dL 99  82  67   BUN 8 - 23 mg/dL 19  15  16    Creatinine 0.44 - 1.00 mg/dL 9.52  8.41  3.24   Sodium 135 - 145 mmol/L 140  141  143   Potassium 3.5 - 5.1 mmol/L 3.7  4.2  4.2   Chloride 98 - 111 mmol/L 106  103  106   CO2 22 - 32 mmol/L 30  24  25    Calcium  8.9 - 10.3 mg/dL 9.5  40.1  9.3   Total Protein 6.5 - 8.1 g/dL 7.2  7.2  7.0   Total Bilirubin 0.0 - 1.2 mg/dL 0.3  0.2  0.4   Alkaline Phos 38 - 126 U/L 62  74  71   AST 15 - 41 U/L 17  28  23    ALT 0 - 44 U/L 9  17  19        RADIOGRAPHIC STUDIES: I have personally reviewed the radiological images as listed and agreed with the findings in the report. No results found.    No orders  of the defined types were placed in this encounter.  All questions were answered. The patient knows to call the clinic with any problems, questions or concerns. No barriers to learning was detected. The total time spent in the appointment was 30 minutes.     Sonja [place], MD 01/28/2024

## 2024-01-29 ENCOUNTER — Other Ambulatory Visit: Payer: Self-pay

## 2024-01-29 ENCOUNTER — Ambulatory Visit
Admission: RE | Admit: 2024-01-29 | Discharge: 2024-01-29 | Disposition: A | Discharge status: Home / Self Care | Source: Ambulatory Visit | Attending: Radiation Oncology | Admitting: Radiation Oncology

## 2024-01-29 DIAGNOSIS — Z510 Encounter for antineoplastic radiation therapy: Secondary | ICD-10-CM | POA: Diagnosis not present

## 2024-01-29 LAB — RAD ONC ARIA SESSION SUMMARY
Course Elapsed Days: 20
Plan Fractions Treated to Date: 15
Plan Prescribed Dose Per Fraction: 2.67 Gy
Plan Total Fractions Prescribed: 16
Plan Total Prescribed Dose: 42.72 Gy
Reference Point Dosage Given to Date: 40.05 Gy
Reference Point Session Dosage Given: 2.67 Gy
Session Number: 15

## 2024-01-30 ENCOUNTER — Other Ambulatory Visit: Payer: Self-pay

## 2024-01-30 ENCOUNTER — Ambulatory Visit
Admission: RE | Admit: 2024-01-30 | Discharge: 2024-01-30 | Disposition: A | Discharge status: Home / Self Care | Source: Ambulatory Visit | Attending: Radiation Oncology | Admitting: Radiation Oncology

## 2024-01-30 DIAGNOSIS — Z510 Encounter for antineoplastic radiation therapy: Secondary | ICD-10-CM | POA: Diagnosis not present

## 2024-01-30 LAB — RAD ONC ARIA SESSION SUMMARY
Course Elapsed Days: 21
Plan Fractions Treated to Date: 16
Plan Prescribed Dose Per Fraction: 2.67 Gy
Plan Total Fractions Prescribed: 16
Plan Total Prescribed Dose: 42.72 Gy
Reference Point Dosage Given to Date: 42.72 Gy
Reference Point Session Dosage Given: 2.67 Gy
Session Number: 16

## 2024-01-31 NOTE — Radiation Completion Notes (Addendum)
  Radiation Oncology         [phone]) [phone] ________________________________  Name: Tamara Lutz MRN: [id number]  Date of Service: 01/30/2024  DOB: Mar 02, 1961  End of Treatment Note                            RADIATION ONCOLOGY END OF TREATMENT NOTE     Diagnosis: Stage IA (pT1b, pN0, M0) Left Breast UOQ, Invasive and in situ ductal carcinoma, ER+ / PR+ / Her2-, Grade  Intent: Curative     ==========DELIVERED PLANS==========  First Treatment Date: 2024-01-09 Last Treatment Date: 2024-01-30   Plan Name: Breast_L_BH Site: Breast, Left Technique: 3D Mode: Photon Dose Per Fraction: 2.67 Gy Prescribed Dose (Delivered / Prescribed): 42.72 Gy / 42.72 Gy Prescribed Fxs (Delivered / Prescribed): 16 / 16     ====================================   The patient tolerated radiation. She experienced intermittent sharp, shooting pains within the breast, and developed fatigue and anticipated skin changes in the treatment field.   The patient will return in one month and will continue follow up with Dr. Maryalice Smaller as well.      Amiel Kalata, PA-C

## 2024-02-26 ENCOUNTER — Telehealth: Payer: Self-pay | Admitting: *Deleted

## 2024-02-26 NOTE — Telephone Encounter (Signed)
 CALLED PATIENT TO INFORM THAT HER FU ON 03-05-24 WILL BE WITH PA, ELLIE MUIR DUE TO DR. KINARD BEING OFF, LVM FOR A RETURN CALL

## 2024-03-03 ENCOUNTER — Encounter: Payer: Self-pay | Admitting: Radiation Oncology

## 2024-03-04 NOTE — Progress Notes (Signed)
 Tamara Lutz presents today to see Julio Ohm PA-C for follow up post radiation to the left breast.    They completed their radiation on: 01/30/2024  Does the patient complain of any of the following: Post radiation skin issues: Denies Breast Tenderness: Denies Breast Swelling: Denies Lymphadema: Denies Range of Motion limitations: She reports exercising her arm every morning! Fatigue post radiation: She reports some mild fatigue  Appetite: [employer]:Fair  Additional comments if applicable: BP (!) 140/91 (BP Location: Right Arm, Patient Position: Sitting, Cuff Size: Normal)   Pulse (!) 55   Temp (!) 97.4 F (36.3 C)   Resp 18   Ht 5\' 3"  (1.6 m)   Wt 145 lb 9.6 oz (66 kg)   LMP 04/09/2012   SpO2 97%   BMI 25.79 kg/m

## 2024-03-05 ENCOUNTER — Ambulatory Visit
Admission: RE | Admit: 2024-03-05 | Discharge: 2024-03-05 | Disposition: A | Discharge status: Home / Self Care | Source: Ambulatory Visit | Attending: Radiology | Admitting: Radiology

## 2024-03-05 ENCOUNTER — Encounter: Payer: Self-pay | Admitting: Radiation Oncology

## 2024-03-05 VITALS — BP 140/91 | HR 55 | Temp 97.4°F | Resp 18 | Ht 63.0 in | Wt 145.6 lb

## 2024-03-05 DIAGNOSIS — Z170 Estrogen receptor positive status [ER+]: Secondary | ICD-10-CM | POA: Diagnosis not present

## 2024-03-05 DIAGNOSIS — Z923 Personal history of irradiation: Secondary | ICD-10-CM | POA: Insufficient documentation

## 2024-03-05 DIAGNOSIS — Z79899 Other long term (current) drug therapy: Secondary | ICD-10-CM | POA: Diagnosis not present

## 2024-03-05 DIAGNOSIS — Z79811 Long term (current) use of aromatase inhibitors: Secondary | ICD-10-CM | POA: Diagnosis not present

## 2024-03-05 DIAGNOSIS — C50412 Malignant neoplasm of upper-outer quadrant of left female breast: Secondary | ICD-10-CM | POA: Diagnosis present

## 2024-03-05 HISTORY — DX: Personal history of irradiation: Z92.3

## 2024-03-05 NOTE — Progress Notes (Signed)
 Radiation Oncology         [phone]) [phone] ________________________________  Name: Tamara Lutz MRN: [id number]  Date: 03/05/2024  DOB: 1960/12/22  Follow-Up Visit Note  CC: Early, Adriane Albe, NP  Early, Adriane Albe, NP    ICD-10-CM   1. Malignant neoplasm of upper-outer quadrant of left breast in female, estrogen receptor positive (HCC)  C50.412    Z17.0       Diagnosis:  Stage IA (pT1b, pN0, M0) Left Breast UOQ, Invasive and in situ ductal carcinoma, ER+ / PR+ / Her2-, Grade 2  Interval Since Last Radiation:  1 month 6 days  Intent: Curative  First Treatment Date: 2024-01-09 Last Treatment Date: 2024-01-30   Plan Name: Breast_L_BH Site: Breast, Left Technique: 3D Mode: Photon Dose Per Fraction: 2.67 Gy Prescribed Dose (Delivered / Prescribed): 42.72 Gy / 42.72 Gy Prescribed Fxs (Delivered / Prescribed): 16 / 16  Narrative:  The patient returns today for routine follow-up. Patient completed her radiation treatment on 01/30/2024. She tolerated this quite well. She did however endorse experiencing intermittent sharp, shooting pains within the breast, and developed fatigue and anticipated skin changes in the treatment field.   In the interval since she was last seen, patient presented for a follow up visit with Dr. Maryalice Smaller on 01/28/24. Dr. Maryalice Smaller recommended initiating letrozole  following the completion of her radiation therapy. She started this approximately 2 weeks ago.   No other significant oncologic interval history since the patient was last seen.                              Patient reports to be doing well overall today. She reports mild fatigue since completing the radiation, but otherwise denies any residual skin changes from radiation, breast tenderness, or swelling. She has full ROM in her left shoulder and completes her exercises every morning.   Allergies:  is allergic to gabapentin .  Meds: Current Outpatient Medications  Medication Sig Dispense Refill    aspirin-acetaminophen -caffeine (EXCEDRIN MIGRAINE) 250-250-65 MG tablet Take by mouth every 6 (six) hours as needed for headache.     Calcium  Carb-Cholecalciferol 600-400 MG-UNIT CAPS Take 1 capsule by mouth.     cetirizine (ZYRTEC) 10 MG chewable tablet Chew 10 mg by mouth daily.     Cholecalciferol (VITAMIN D ) 50 MCG (2000 UT) CAPS Take by mouth.     fluticasone  (FLONASE ) 50 MCG/ACT nasal spray Place 2 sprays into both nostrils daily. 16 g 6   ibandronate (BONIVA) 150 MG tablet Take 150 mg by mouth every 30 (thirty) days. Take in the morning with a full glass of water, on an empty stomach, and do not take anything else by mouth or lie down for the next 30 min.     letrozole  (FEMARA ) 2.5 MG tablet Take 1 tablet (2.5 mg total) by mouth daily. 30 tablet 3   Magnesium 500 MG CAPS Take by mouth.     montelukast  (SINGULAIR ) 10 MG tablet Take 1 tablet (10 mg total) by mouth at bedtime. 90 tablet 0   Multiple Vitamin (MULTIVITAMIN WITH MINERALS) TABS tablet Take 1 tablet by mouth daily.     Multiple Vitamin (MULTIVITAMIN) tablet Take 1 tablet by mouth daily.     rizatriptan  (MAXALT ) 10 MG tablet Take 10 mg (1 tablet total) by mouth at the start of the headache. May repeat in 2 hours x 1 if headache persists. Max of 2 tablets/24 hours. 12 tablet 6   zaleplon (  SONATA) 10 MG capsule Take 10 mg by mouth at bedtime as needed for sleep.     No current facility-administered medications for this encounter.    Physical Findings: The patient is in no acute distress. Patient is alert and oriented.  height is 5\' 3"  (1.6 m) and weight is 145 lb 9.6 oz (66 kg). Her temperature is 97.4 F (36.3 C) (abnormal). Her blood pressure is 140/91 (abnormal) and her pulse is 55 (abnormal). Her respiration is 18 and oxygen saturation is 97%. .  No significant changes. Lungs are clear to auscultation bilaterally. Heart has regular rate and rhythm. No palpable cervical, supraclavicular, or axillary adenopathy. Abdomen soft,  non-tender, normal bowel sounds.  Right breast: No palpable masses, skin changes, or other abnormalities.  Left breast: Well healed lumpectomy and lymph node incisions, mag trace present. No skin changes within the treatment field.    Lab Findings: Lab Results  Component Value Date   WBC 4.2 10/30/2023   HGB 12.8 10/30/2023   HCT 40.1 10/30/2023   MCV 89.1 10/30/2023   PLT 247 10/30/2023    Radiographic Findings: No results found.  Impression:  Stage IA (pT1b, pN0, M0) Left Breast UOQ, Invasive and in situ ductal carcinoma, ER+ / PR+ / Her2-, Grade 2; s/p left lumpectomy and adjuvant radiation completed on 01/30/2024, currently on antihormone treatment  The patient has healed well from the effects of her radiation treatment and  appears to be tolerating her antihormone treatment well.   She will continue on letrozole  under the care of Dr. Maryalice Smaller. She is scheduled to see Lacie Burton, NP for survivorship clinic on 05/25/2024. Radiation follow-up PRN. She was encouraged to call back with any questions or concerns. We appreciate the opportunity to take part in this patient's care.      20 minutes of total time was spent for this patient encounter, including preparation, face-to-face counseling with the patient and coordination of care, physical exam, and documentation of the encounter. ____________________________________   Julio Ohm, PA-C   This document serves as a record of services personally performed by  Julio Ohm, PA-C. It was created on his behalf by Lucky Sable, a trained medical scribe. The creation of this record is based on the scribe's personal observations and the provider's statements to them. This document has been checked and approved by the attending provider.

## 2024-03-09 ENCOUNTER — Ambulatory Visit: Attending: General Surgery

## 2024-03-09 VITALS — Wt 147.5 lb

## 2024-03-09 DIAGNOSIS — Z483 Aftercare following surgery for neoplasm: Secondary | ICD-10-CM | POA: Insufficient documentation

## 2024-03-09 NOTE — Therapy (Signed)
 OUTPATIENT PHYSICAL THERAPY SOZO SCREENING NOTE   Patient Name: Tamara Lutz MRN: [id number] DOB:Jul 15, 1961, 63 y.o., female Today's Date: 03/09/2024  PCP: Annella Kief, NP REFERRING PROVIDER: Enid Harry, MD   PT End of Session - 03/09/24 [id number]     Visit Number 2   # unchanged due to screen only   PT Start Time 0956    PT Stop Time 1000    PT Time Calculation (min) 4 min    Activity Tolerance Patient tolerated treatment well    Behavior During Therapy [name] for tasks assessed/performed             Past Medical History:  Diagnosis Date   Allergy    Basal cell carcinoma 1962/03/19   nod- upper right chest(CX35FU)   Breast cancer (HCC) 10/2023   left breast IDC   Headache    History of radiation therapy    Left breast-01/09/24-01/30/24-Dr. Retta Caster   Hyperlipidemia    Osteopenia    Past Surgical History:  Procedure Laterality Date   BREAST BIOPSY Left 10/18/2023   US  LT BREAST BX W LOC DEV 1ST LESION IMG BX SPEC US  GUIDE 10/18/2023 [facility]   BREAST BIOPSY  12/02/2023   MM LT RADIOACTIVE SEED LOC MAMMO GUIDE 12/02/2023 [facility]   BREAST LUMPECTOMY WITH RADIOACTIVE SEED AND SENTINEL LYMPH NODE BIOPSY Left 12/03/2023   Procedure: LEFT BREAST SEED GUIDED LUMPECTOMY, LEFT AXILLARY SENTINEL NODE BIOPSY;  Surgeon: Enid Harry, MD;  Location: [facility];  Service: General;  Laterality: Left;   CESAREAN SECTION     CESAREAN SECTION N/A    [employer]reesia 07/31/2020   COLONOSCOPY  2019   Dr.Gupta   DILATION AND CURETTAGE OF UTERUS     FLEXIBLE SIGMOIDOSCOPY N/A 08/11/2021   Procedure: FLEXIBLE SIGMOIDOSCOPY;  Surgeon: Alvis Jourdain, MD;  [facility]: [facility];  Service: Endoscopy;  Laterality: N/A;   HEMOSTASIS CLIP PLACEMENT  08/11/2021   Procedure: HEMOSTASIS CLIP PLACEMENT;  Surgeon: Alvis Jourdain, MD;  [facility]: [facility];  Service: Endoscopy;;   MOUTH SURGERY     POLYPECTOMY     SCLEROTHERAPY  08/11/2021   Procedure:  Daryle Eon;  Surgeon: Alvis Jourdain, MD;  Location: [facility];  Service: Endoscopy;;   Patient Active Problem List   Diagnosis Date Noted   S/P breast biopsy, left 11/08/2023   Genetic testing 11/04/2023   Malignant neoplasm of upper-outer quadrant of left breast in female, estrogen receptor positive (HCC) 10/24/2023   Encounter for annual physical exam 06/26/2023   H/O colonoscopy with polypectomy    Insomnia 05/24/2021   Osteoporosis 06/02/2019   Migraine without aura 06/02/2019   Hyperlipidemia 06/02/2019   Irritable bowel syndrome 06/02/2019    REFERRING DIAG: left breast cancer at risk for lymphedema  THERAPY DIAG: Aftercare following surgery for neoplasm  PERTINENT HISTORY: Patient was diagnosed on 10/18/2023 with left 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 She underwent a left lumpectomy and sentinel node biopsy (3 negative nodes) on 12/03/2023. It is ER/PR positive and HER2 negative with a Ki67 of 10%. 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   PRECAUTIONS: left UE Lymphedema risk, None  SUBJECTIVE: Pt returns for her first 3 month L-Dex screen.   PAIN:  Are you having pain? No  SOZO SCREENING: Patient was assessed today using the SOZO machine to determine the lymphedema index score. This was compared to her baseline score. It was determined that she is within the recommended range when compared to her  baseline and no further action is needed at this time. She will continue SOZO screenings. These are done every 3 months for 2 years post operatively followed by every 6 months for 2 years, and then annually.   L-DEX FLOWSHEETS - 03/09/24 1300       L-DEX LYMPHEDEMA SCREENING   Measurement Type Unilateral    L-DEX MEASUREMENT EXTREMITY Upper Extremity    POSITION  Standing    DOMINANT SIDE Right    At Risk Side Left    BASELINE SCORE (UNILATERAL) 0    L-DEX SCORE (UNILATERAL) -0.7    VALUE CHANGE (UNILAT) -0.7                Denyce Flank, PTA 03/09/2024, 1:21 PM

## 2024-03-11 ENCOUNTER — Other Ambulatory Visit: Payer: Self-pay | Admitting: Family

## 2024-03-11 DIAGNOSIS — J309 Allergic rhinitis, unspecified: Secondary | ICD-10-CM

## 2024-04-07 ENCOUNTER — Telehealth: Payer: Self-pay

## 2024-04-07 ENCOUNTER — Other Ambulatory Visit: Payer: Self-pay

## 2024-04-07 MED ORDER — LETROZOLE 2.5 MG PO TABS: 2.5000 mg | ORAL_TABLET | Freq: Every day | ORAL | 1 refills | Status: DC

## 2024-04-07 NOTE — Telephone Encounter (Signed)
 Pt called stating that her insurance is requiring her to have a 90 day supply of Letrozole  2.5mg  tabs instead of the 30 day supply sent in April 2025.  Pt stated the cost of the 30 day was much higher than a 90 day supply.  Pt stated she's tolerating the medication well with no c/o joint pain and hot flashes.  Sent 90 day supply to pt's preferred pharmacy on file.  Notified Dr. Lanny and Team of pt's call.

## 2024-04-13 ENCOUNTER — Other Ambulatory Visit: Payer: Self-pay

## 2024-04-24 ENCOUNTER — Other Ambulatory Visit: Payer: Self-pay

## 2024-04-28 ENCOUNTER — Other Ambulatory Visit: Payer: Self-pay

## 2024-04-28 MED ORDER — LETROZOLE 2.5 MG PO TABS: 2.5000 mg | ORAL_TABLET | Freq: Every day | ORAL | 1 refills | Status: DC

## 2024-04-28 MED ORDER — LETROZOLE 2.5 MG PO TABS: 2.5000 mg | ORAL_TABLET | Freq: Every day | ORAL | 1 refills | Status: AC

## 2024-05-25 ENCOUNTER — Inpatient Hospital Stay: Attending: Nurse Practitioner | Admitting: Nurse Practitioner

## 2024-05-25 ENCOUNTER — Encounter: Payer: Self-pay | Admitting: Nurse Practitioner

## 2024-05-25 VITALS — BP 130/70 | HR 78 | Temp 97.7°F | Resp 17 | Wt 148.6 lb

## 2024-05-25 DIAGNOSIS — Z803 Family history of malignant neoplasm of breast: Secondary | ICD-10-CM | POA: Insufficient documentation

## 2024-05-25 DIAGNOSIS — Z923 Personal history of irradiation: Secondary | ICD-10-CM | POA: Insufficient documentation

## 2024-05-25 DIAGNOSIS — Z1732 Human epidermal growth factor receptor 2 negative status: Secondary | ICD-10-CM | POA: Insufficient documentation

## 2024-05-25 DIAGNOSIS — M810 Age-related osteoporosis without current pathological fracture: Secondary | ICD-10-CM | POA: Diagnosis not present

## 2024-05-25 DIAGNOSIS — Z170 Estrogen receptor positive status [ER+]: Secondary | ICD-10-CM | POA: Diagnosis not present

## 2024-05-25 DIAGNOSIS — Z79811 Long term (current) use of aromatase inhibitors: Secondary | ICD-10-CM | POA: Diagnosis not present

## 2024-05-25 DIAGNOSIS — Z8049 Family history of malignant neoplasm of other genital organs: Secondary | ICD-10-CM | POA: Diagnosis not present

## 2024-05-25 DIAGNOSIS — Z1721 Progesterone receptor positive status: Secondary | ICD-10-CM | POA: Diagnosis not present

## 2024-05-25 DIAGNOSIS — C50412 Malignant neoplasm of upper-outer quadrant of left female breast: Secondary | ICD-10-CM | POA: Insufficient documentation

## 2024-05-25 NOTE — Progress Notes (Signed)
 Patient Care Team: Early, Camie BRAVO, NP as PCP - General (Nurse Practitioner) Wendel Lurena POUR, MD as PCP - Cardiology (Cardiology) Skeet Juliene SAUNDERS, DO as Consulting Physician (Neurology) Glean Stephane BROCKS, RN (Inactive) as Oncology Nurse Navigator Tyree Nanetta SAILOR, RN as Oncology Nurse Navigator Lanny Callander, MD as Consulting Physician (Hematology) Shannon Agent, MD as Consulting Physician (Radiation Oncology) Ebbie Cough, MD as Consulting Physician (General Surgery) Ruhi Kopke K, NP as Nurse Practitioner (Nurse Practitioner)   CLINIC:  Survivorship   REASON FOR VISIT:  Routine follow-up post-treatment for a recent history of breast cancer.  BRIEF ONCOLOGIC HISTORY:  Oncology History  Malignant neoplasm of upper-outer quadrant of left breast in female, estrogen receptor positive (HCC)  06/26/2018 Genetic Testing   Negative genetic testing on the [facility] panel through [facility]. GALANT1 2 VUS identified. The report date is June 26, 2018.  The [facility] gene panel offered by [employer] includes sequencing and deletion/duplication testing of the following 35 genes: APC, ATM, AXIN2, BARD1, BMPR1A, BRCA1, BRCA2, BRIP1, CHD1, CDK4, CDKN2A, CHEK2, EPCAM (large rearrangement only), HOXB13, (sequencing only), GALNT12, MLH1, MSH2, MSH3 (excluding repetitive portions of exon 1), MSH6, MUTYH, NBN, NTHL1, PALB2, PMS2, PTEN, RAD51C, RAD51D, RNF43, RPS20, SMAD4, STK11, and TP53. Sequencing was performed for select regions of POLE and POLD1, and large rearrangement analysis was performed for select regions of GREM1.    10/18/2023 Cancer Staging   Staging form: Breast, AJCC 8th Edition - Clinical stage from 10/18/2023: Stage IA (cT1b, cN0, cM0, G2, ER+, PR+, HER2-) - Signed by Lanny Callander, MD on 10/29/2023 Stage prefix: Initial diagnosis Histologic grading system: 3 grade system   10/24/2023 Initial Diagnosis   Malignant neoplasm of upper-outer quadrant of left breast  in female, estrogen receptor positive (HCC)   12/03/2023 Cancer Staging   Staging form: Breast, AJCC 8th Edition - Pathologic stage from 12/03/2023: Stage IA (pT1c, pN0, cM0, G2, ER+, PR+, HER2-) - Signed by Lanny Callander, MD on 01/27/2024 Histologic grading system: 3 grade system Residual tumor (R): R0     INTERVAL HISTORY:  Tamara Lutz presents to the Survivorship Clinic today for our initial meeting to review her survivorship care plan detailing her treatment course for breast cancer, as well as monitoring long-term side effects of that treatment, education regarding health maintenance, screening, and overall wellness and health promotion.     Overall, Tamara Lutz reports feeling well since completing her radiation therapy, healed well.  Has some breast soreness with stretching, denies new lump/mass, nipple discharge or inversion, or skin change.  She has retained some of the dye.  Tolerating letrozole  mostly well with occasional nausea and dizziness, night sweats and hot flashes.  These are not limiting her function or quality of life and willing to tolerate.   REVIEW OF SYSTEMS:  Review of Systems - Oncology Breast: Denies any new nodularity, masses, tenderness, nipple changes, or nipple discharge.      ONCOLOGY TREATMENT TEAM:  1. Surgeon:  Dr. Ebbie at [facility] 2. Medical Oncologist: Dr. Lanny  3. Radiation Oncologist: Dr. Shannon    PAST MEDICAL/SURGICAL HISTORY:  Past Medical History:  Diagnosis Date   Allergy    Basal cell carcinoma Jun 28, 1962   nod- upper right chest(CX35FU)   Breast cancer (HCC) 10/2023   left breast IDC   Headache    History of radiation therapy    Left breast-01/09/24-01/30/24-Dr. Agent Shannon   Hyperlipidemia    Osteopenia    Past Surgical History:  Procedure Laterality  Date   BREAST BIOPSY Left 10/18/2023   US  LT BREAST BX W LOC DEV 1ST LESION IMG BX SPEC US  GUIDE 10/18/2023 [facility]   BREAST BIOPSY  12/02/2023   MM LT  RADIOACTIVE SEED LOC MAMMO GUIDE 12/02/2023 [facility]   BREAST LUMPECTOMY WITH RADIOACTIVE SEED AND SENTINEL LYMPH NODE BIOPSY Left 12/03/2023   Procedure: LEFT BREAST SEED GUIDED LUMPECTOMY, LEFT AXILLARY SENTINEL NODE BIOPSY;  Surgeon: Ebbie Cough, MD;  Location: [facility];  Service: General;  Laterality: Left;   CESAREAN SECTION     CESAREAN SECTION N/A    Phreesia 07/31/2020   COLONOSCOPY  2019   Dr.Gupta   DILATION AND CURETTAGE OF UTERUS     FLEXIBLE SIGMOIDOSCOPY N/A 08/11/2021   Procedure: FLEXIBLE SIGMOIDOSCOPY;  Surgeon: Rollin Dover, MD;  Location: [facility];  Service: Endoscopy;  Laterality: N/A;   HEMOSTASIS CLIP PLACEMENT  08/11/2021   Procedure: HEMOSTASIS CLIP PLACEMENT;  Surgeon: Rollin Dover, MD;  Location: [facility];  Service: Endoscopy;;   MOUTH SURGERY     POLYPECTOMY     SCLEROTHERAPY  08/11/2021   Procedure: MATIAS;  Surgeon: Rollin Dover, MD;  Location: [facility];  Service: Endoscopy;;     ALLERGIES:  Allergies  Allergen Reactions   Gabapentin  Other (See Comments)    Dizziness, unstable feeling     CURRENT MEDICATIONS:  Outpatient Encounter Medications as of 05/25/2024  Medication Sig   aspirin-acetaminophen -caffeine (EXCEDRIN MIGRAINE) 250-250-65 MG tablet Take by mouth every 6 (six) hours as needed for headache.   Calcium  Carb-Cholecalciferol 600-400 MG-UNIT CAPS Take 1 capsule by mouth.   cetirizine (ZYRTEC) 10 MG chewable tablet Chew 10 mg by mouth daily.   Cholecalciferol (VITAMIN D ) 50 MCG (2000 UT) CAPS Take by mouth.   fluticasone  (FLONASE ) 50 MCG/ACT nasal spray Place 2 sprays into both nostrils daily.   ibandronate (BONIVA) 150 MG tablet Take 150 mg by mouth every 30 (thirty) days. Take in the morning with a full glass of water, on an empty stomach, and do not take anything else by mouth or lie down for the next 30 min.   letrozole  (FEMARA ) 2.5 MG tablet Take 1 tablet (2.5 mg total) by mouth daily.    Magnesium 500 MG CAPS Take by mouth.   montelukast  (SINGULAIR ) 10 MG tablet TAKE 1 TABLET BY MOUTH AT BEDTIME   Multiple Vitamin (MULTIVITAMIN WITH MINERALS) TABS tablet Take 1 tablet by mouth daily.   Multiple Vitamin (MULTIVITAMIN) tablet Take 1 tablet by mouth daily.   rizatriptan  (MAXALT ) 10 MG tablet Take 10 mg (1 tablet total) by mouth at the start of the headache. May repeat in 2 hours x 1 if headache persists. Max of 2 tablets/24 hours.   zaleplon (SONATA) 10 MG capsule Take 10 mg by mouth at bedtime as needed for sleep.   No facility-administered encounter medications on file as of 05/25/2024.     ONCOLOGIC FAMILY HISTORY:  Family History  Problem Relation Age of Onset   Multiple sclerosis Mother    Uterine cancer Mother    Alzheimer's disease Father    Cancer Sister 22       Breast cancer   Breast cancer Sister 71   Diabetes Neg Hx    Heart disease Neg Hx    Hypertension Neg Hx    Colon cancer Neg Hx    Esophageal cancer Neg Hx    Rectal cancer Neg Hx    Stomach cancer Neg Hx      GENETIC COUNSELING/TESTING:  Yes, VUS GALNT12  SOCIAL HISTORY:  Tamara Lutz is married with children living in [place], [place] .  She has (3) children and they live locally.  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  She denies any current or history of tobacco or illicit drug use.  Drinks alcohol socially   PHYSICAL EXAMINATION:  Vital Signs:   Vitals:   05/25/24 1035  BP: 130/70  Pulse: 78  Resp: 17  Temp: 97.7 F (36.5 C)  SpO2: 99%   Filed Weights   05/25/24 1035  Weight: 148 lb 9.6 oz (67.4 kg)   General: Well-nourished, well-appearing female in no acute distress.   HEENT: Sclerae anicteric.  Respiratory:breathing non-labored.  Neuro: No focal deficits. Steady gait.  Psych: Mood and affect normal and appropriate for situation.  Extremities: No edema. MSK: Full range of motion in bilateral upper extremities Skin: Warm and dry. Breast:  inspection of the upper outer left breast lumpectomy scar reveals completely healed with minimal scar tissue  LABORATORY DATA:  None for this visit.  DIAGNOSTIC IMAGING:  None for this visit.      ASSESSMENT AND PLAN:  Tamara Lutz is a pleasant 63 y.o. 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  She presents to the Survivorship Clinic for our initial meeting and routine follow-up post-completion of treatment for breast cancer.    #. Stage I left breast cancer:  Tamara Lutz is continuing to recover from definitive treatment for breast cancer. She will follow-up with her medical oncologist, Dr. Lanny in 08/2024 with history and physical exam per surveillance protocol.  She will continue her anti-estrogen therapy with letrozole . Thus far, she is tolerating mostly well, with minimal side effects including mild intermittent nausea, dizziness, hot flashes and vaginal dryness.  We reviewed management strategies.. She was instructed to make Dr. Lanny or myself aware if she begins to experience any worsening side effects of the medication and I could see her back in clinic to help manage those side effects, as needed. Today, a comprehensive survivorship care plan and treatment summary was reviewed with the patient today detailing her breast cancer diagnosis, treatment course, potential late/long-term effects of treatment, appropriate follow-up care with recommendations for the future, and patient education resources.  A copy of this summary, along with a letter will be sent to the patient's primary care provider via mail/fax/In Basket message after today's visit.    #. Bone health:  Given Tamara Lutz baseline osteoporosis, AI was recommended with caution.  She is on Boniva.  Encouraged her to continue calcium /vitamin D  and weightbearing exercise.  DEXA  every 2 years.    #. Cancer screening:  Due to Tamara Lutz's history and her age, she should receive screening for skin cancers, colon cancer, and gynecologic cancers.  The information and recommendations are listed on the patient's comprehensive care plan/treatment summary and were reviewed in detail with the patient.    #. Health maintenance and wellness promotion: Tamara Lutz was encouraged to consume 5-7 servings of fruits and vegetables per day.  She was also encouraged to engage in moderate to vigorous exercise for 30 minutes per day most days of the week. She was instructed to limit her alcohol consumption and continue to abstain from tobacco use.     #. Support services/counseling: It is not uncommon for this period of the patient's cancer care trajectory to be one  of many emotions and stressors.  We discussed an opportunity for her to participate in the next session of FYNN (Finding Your New Normal) support group series designed for patients after they have completed treatment.   Tamara Lutz was encouraged to take advantage of our many other support services programs, support groups, and/or counseling in coping with her new life as a cancer survivor after completing anti-cancer treatment.  She was offered support today through active listening and expressive supportive counseling.  She was given information regarding our available services and encouraged to contact me with any questions or for help enrolling in any of our support group/programs.    Dispo:   -Continue letrozole  -Return to cancer center 08/2024 as scheduled -Mammogram due in 10/2024.  Due to her personal history of breast cancer and elevated lifetime risk (21% per myriad myrisk) she would likely benefit from additional screening such as MRI staggered 6 months apart from mammograms.  Consider starting 04/2025 -Discussed screening for her children -Follow up with surgery as indicated  Orders Placed This Encounter  Procedures   MM DIAG  BREAST TOMO BILATERAL    Standing Status:   Future    Expected Date:   10/16/2024    Expiration Date:   05/25/2025    Reason for Exam (SYMPTOM  OR DIAGNOSIS REQUIRED):   h/o left breast cancer 10/2023, elevated lifetime risk    Preferred imaging [facility]?:   [facility]     She is welcome to return back to the Survivorship Clinic at any time; no additional follow-up needed at this time. Consider referral back to survivorship as a long-term survivor for continued surveillance  A total of (30) minutes of face-to-face time was spent with this patient with greater than 50% of that time in counseling and care-coordination.   Yeison Sippel, NP Survivorship Program [facility] [phone]   Note: PRIMARY CARE PROVIDER Oris Camie BRAVO, [place] [phone]

## 2024-06-06 ENCOUNTER — Other Ambulatory Visit: Payer: Self-pay | Admitting: Physician Assistant

## 2024-06-06 DIAGNOSIS — J309 Allergic rhinitis, unspecified: Secondary | ICD-10-CM

## 2024-06-15 ENCOUNTER — Ambulatory Visit: Attending: General Surgery

## 2024-06-15 VITALS — Wt 143.4 lb

## 2024-06-15 DIAGNOSIS — Z483 Aftercare following surgery for neoplasm: Secondary | ICD-10-CM | POA: Insufficient documentation

## 2024-06-15 NOTE — Therapy (Signed)
 OUTPATIENT PHYSICAL THERAPY SOZO SCREENING NOTE   Patient Name: Tamara Lutz MRN: [id number] DOB:Dec 28, 1960, 63 y.o., female Today's Date: 06/15/2024  PCP: Oris Camie BRAVO, NP REFERRING PROVIDER: Ebbie Cough, MD   PT End of Session - 06/15/24 1610     Visit Number 2   # unchanged due to screen only   PT Start Time 1608    PT Stop Time 1612    PT Time Calculation (min) 4 min    Activity Tolerance Patient tolerated treatment well    Behavior During Therapy [facility] for tasks assessed/performed          Past Medical History:  Diagnosis Date   Allergy    Basal cell carcinoma 09/25/62   nod- upper right chest(CX35FU)   Breast cancer (HCC) 10/2023   left breast IDC   Headache    History of radiation therapy    Left breast-01/09/24-01/30/24-Dr. Lynwood Nasuti   Hyperlipidemia    Osteopenia    Past Surgical History:  Procedure Laterality Date   BREAST BIOPSY Left 10/18/2023   US  LT BREAST BX W LOC DEV 1ST LESION IMG BX SPEC US  GUIDE 10/18/2023 [facility]   BREAST BIOPSY  12/02/2023   MM LT RADIOACTIVE SEED LOC MAMMO GUIDE 12/02/2023 [facility]   BREAST LUMPECTOMY WITH RADIOACTIVE SEED AND SENTINEL LYMPH NODE BIOPSY Left 12/03/2023   Procedure: LEFT BREAST SEED GUIDED LUMPECTOMY, LEFT AXILLARY SENTINEL NODE BIOPSY;  Surgeon: Ebbie Cough, MD;  Location: [facility];  Service: General;  Laterality: Left;   CESAREAN SECTION     CESAREAN SECTION N/A    Phreesia 07/31/2020   COLONOSCOPY  2019   Dr.Gupta   DILATION AND CURETTAGE OF UTERUS     FLEXIBLE SIGMOIDOSCOPY N/A 08/11/2021   Procedure: FLEXIBLE SIGMOIDOSCOPY;  Surgeon: Rollin Dover, MD;  [facility]: [facility];  Service: Endoscopy;  Laterality: N/A;   HEMOSTASIS CLIP PLACEMENT  08/11/2021   Procedure: HEMOSTASIS CLIP PLACEMENT;  Surgeon: Rollin Dover, MD;  [facility];  Service: Endoscopy;;   MOUTH SURGERY     POLYPECTOMY     SCLEROTHERAPY  08/11/2021   Procedure:  MATIAS;  Surgeon: Rollin Dover, MD;  [facility]: [facility];  Service: Endoscopy;;   Patient Active Problem List   Diagnosis Date Noted   S/P breast biopsy, left 11/08/2023   Genetic testing 11/04/2023   Malignant neoplasm of upper-outer quadrant of left breast in female, estrogen receptor positive (HCC) 10/24/2023   Encounter for annual physical exam 06/26/2023   H/O colonoscopy with polypectomy    Insomnia 05/24/2021   Osteoporosis 06/02/2019   Migraine without aura 06/02/2019   Hyperlipidemia 06/02/2019   Irritable bowel syndrome 06/02/2019    REFERRING DIAG: left breast cancer at risk for lymphedema  THERAPY DIAG: Aftercare following surgery for neoplasm  PERTINENT HISTORY: Patient was diagnosed on 10/18/2023 with left 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 She underwent a left lumpectomy and sentinel node biopsy (3 negative nodes) on 12/03/2023. It is ER/PR positive and HER2 negative with a Ki67 of 10%. 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   PRECAUTIONS: left UE Lymphedema risk, None  SUBJECTIVE: Pt returns for her 3 month L-Dex screen.   PAIN:  Are you having pain? No  SOZO SCREENING: Patient was assessed today using the SOZO machine to determine the lymphedema index score. This was compared to her baseline score. It was determined that she is within the recommended range when compared to her baseline and no further  action is needed at this time. She will continue SOZO screenings. These are done every 3 months for 2 years post operatively followed by every 6 months for 2 years, and then annually.   L-DEX FLOWSHEETS - 06/15/24 1600       L-DEX LYMPHEDEMA SCREENING   Measurement Type Unilateral    L-DEX MEASUREMENT EXTREMITY Upper Extremity    POSITION  Standing    DOMINANT SIDE Right    At Risk Side Left    BASELINE SCORE (UNILATERAL) 0    L-DEX SCORE (UNILATERAL) 2.6    VALUE CHANGE (UNILAT) 2.6            Aden Berwyn Caldron, PTA 06/15/2024, 4:11 PM

## 2024-06-29 NOTE — Progress Notes (Signed)
 Catheline Doing, DNP, AGNP-c [facility] [address] [place], [place] [address] Main Office [phone] VISIT TYPE: CPE on 06/30/2024 Today's Vitals   06/30/24 0822  BP: 122/80  Pulse: 68  Weight: 140 lb 12.8 oz (63.9 kg)  Height: 5' 3 (1.6 m)   Body mass index is 24.94 kg/m. BP 122/80   Pulse 68   Ht 5' 3 (1.6 m)   Wt 140 lb 12.8 oz (63.9 kg)   LMP 04/09/2012   BMI 24.94 kg/m   Subjective:    Patient ID: Tamara Lutz, female    DOB: 1961-02-17, 63 y.o.   MRN: [id number]  HPI:  History of Present Illness Chelsey Redondo Dyasia Firestine is a 63 year old female who presents for her annual physical exam and to discuss medication management.  She was diagnosed with breast cancer in January and is currently cancer-free following surgery and radiation therapy. She is on letrozole , which has induced menopause-like symptoms such as fatigue, dizziness, and irritability. These symptoms are manageable but have impacted her daily life, including interactions with her husband.  She experiences significant allergy symptoms despite daily use of Singulair . Her allergies have been particularly severe this fall, affecting her ability to function, especially after outdoor activities like kayaking.  She has a history of sleep disturbances, which have been exacerbated since her cancer diagnosis. She was prescribed sleeping pills, which she used regularly but is now trying to reduce her dependency on them. She is currently sleeping well with the help of a bedtime routine and meditation.  She experiences neck pain, which she manages with exercises and muscle rubs. This pain sometimes leads to headaches, particularly at night, and she manages it with exercises and muscle rubs. She also uses Excedrin Migraine for headache relief.  She has noticed changes in her urinary habits, specifically a change in the odor of her urine over the past couple of years. Her urine samples  have always returned normal results.  She has a history of skin cancer and is vigilant about monitoring her skin for any changes. She regularly visits a dermatologist for check-ups. No new skin changes or lumps. Pertinent items are noted in HPI. Prevnar 20 and Flu vaccines given today Most Recent Depression Screen:     06/30/2024    8:25 AM 01/02/2024    1:03 PM 12/09/2023    2:41 PM 06/24/2023    3:15 PM 03/19/2022    8:53 AM  Depression screen PHQ 2/9  Decreased Interest 0 0 0 0 0  Down, Depressed, Hopeless 0 0 0 0 0  PHQ - 2 Score 0 0 0 0 0   Most Recent Anxiety Screen:      No data to display         Most Recent Fall Screen:    06/30/2024    8:25 AM 06/24/2023    3:15 PM 03/19/2022    8:52 AM 05/24/2021    9:10 AM 10/13/2020    1:41 PM  Fall Risk   Falls in the past year? 0 0 0 0 0  Number falls in past yr: 0 0 0 0 0  Injury with Fall? 0 0 0 0 0  Risk for fall due to : No Fall Risks No Fall Risks No Fall Risks No Fall Risks   Follow up Falls evaluation completed Falls evaluation completed Falls evaluation completed  Falls evaluation completed  Falls evaluation completed      Data saved with a previous flowsheet row definition  Past medical history, surgical history, medications, allergies, family history and social history reviewed with patient today and changes made to appropriate areas of the chart.  Past Medical History:  Past Medical History:  Diagnosis Date   Allergy    Basal cell carcinoma November 02, 1961   nod- upper right chest(CX35FU)   Breast cancer (HCC) 10/2023   left breast IDC   Headache    History of radiation therapy    Left breast-01/09/24-01/30/24-Dr. Lynwood Nasuti   Hyperlipidemia    Osteopenia    Medications:  Current Outpatient Medications on File Prior to Visit  Medication Sig   aspirin-acetaminophen -caffeine (EXCEDRIN MIGRAINE) 250-250-65 MG tablet Take by mouth every 6 (six) hours as needed for headache.   Calcium  Carb-Cholecalciferol 600-400  MG-UNIT CAPS Take 1 capsule by mouth.   Cholecalciferol (VITAMIN D ) 50 MCG (2000 UT) CAPS Take by mouth.   ibandronate (BONIVA) 150 MG tablet Take 150 mg by mouth every 30 (thirty) days. Take in the morning with a full glass of water, on an empty stomach, and do not take anything else by mouth or lie down for the next 30 min.   letrozole  (FEMARA ) 2.5 MG tablet Take 1 tablet (2.5 mg total) by mouth daily.   Magnesium 500 MG CAPS Take by mouth.   Multiple Vitamin (MULTIVITAMIN WITH MINERALS) TABS tablet Take 1 tablet by mouth daily.   Multiple Vitamin (MULTIVITAMIN) tablet Take 1 tablet by mouth daily.   No current facility-administered medications on file prior to visit.   Surgical History:  Past Surgical History:  Procedure Laterality Date   BREAST BIOPSY Left 10/18/2023   US  LT BREAST BX W LOC DEV 1ST LESION IMG BX SPEC US  GUIDE 10/18/2023 [facility]   BREAST BIOPSY  12/02/2023   MM LT RADIOACTIVE SEED LOC MAMMO GUIDE 12/02/2023 [facility]   BREAST LUMPECTOMY WITH RADIOACTIVE SEED AND SENTINEL LYMPH NODE BIOPSY Left 12/03/2023   Procedure: LEFT BREAST SEED GUIDED LUMPECTOMY, LEFT AXILLARY SENTINEL NODE BIOPSY;  Surgeon: Ebbie Cough, MD;  Location: [facility];  Service: General;  Laterality: Left;   CESAREAN SECTION     CESAREAN SECTION N/A    [employer] 07/31/2020   COLONOSCOPY  2019   Dr.Gupta   DILATION AND CURETTAGE OF UTERUS     FLEXIBLE SIGMOIDOSCOPY N/A 08/11/2021   Procedure: FLEXIBLE SIGMOIDOSCOPY;  Surgeon: Rollin Dover, MD;  Location: [facility];  Service: Endoscopy;  Laterality: N/A;   HEMOSTASIS CLIP PLACEMENT  08/11/2021   Procedure: HEMOSTASIS CLIP PLACEMENT;  Surgeon: Rollin Dover, MD;  Location: [facility];  Service: Endoscopy;;   MOUTH SURGERY     POLYPECTOMY     SCLEROTHERAPY  08/11/2021   Procedure: MATIAS;  Surgeon: Rollin Dover, MD;  Location: [facility];  Service: Endoscopy;;   Allergies:  Allergies  Allergen  Reactions   Gabapentin  Other (See Comments)    Dizziness, unstable feeling   Family History:  Family History  Problem Relation Age of Onset   Multiple sclerosis Mother    Uterine cancer Mother    Alzheimer's disease Father    Cancer Sister 53       Breast cancer   Breast cancer Sister 50   Diabetes Neg Hx    Heart disease Neg Hx    Hypertension Neg Hx    Colon cancer Neg Hx    Esophageal cancer Neg Hx    Rectal cancer Neg Hx    Stomach cancer Neg Hx        Objective:  BP 122/80   Pulse 68   Ht 5' 3 (1.6 m)   Wt 140 lb 12.8 oz (63.9 kg)   LMP 04/09/2012   BMI 24.94 kg/m   Wt Readings from Last 3 Encounters:  06/30/24 140 lb 12.8 oz (63.9 kg)  06/15/24 143 lb 6 oz (65 kg)  05/25/24 148 lb 9.6 oz (67.4 kg)    Physical Exam Vitals and nursing note reviewed.  Constitutional:      General: She is not in acute distress.    Appearance: Normal appearance.  HENT:     Head: Normocephalic and atraumatic.     Right Ear: Hearing, tympanic membrane, ear canal and external ear normal.     Left Ear: Hearing, tympanic membrane, ear canal and external ear normal.     Nose: Nose normal.     Right Sinus: No maxillary sinus tenderness or frontal sinus tenderness.     Left Sinus: No maxillary sinus tenderness or frontal sinus tenderness.     Mouth/Throat:     Lips: Pink.     Mouth: Mucous membranes are moist.     Pharynx: Oropharynx is clear.  Eyes:     General: Lids are normal. Vision grossly intact.     Extraocular Movements: Extraocular movements intact.     Conjunctiva/sclera: Conjunctivae normal.     Pupils: Pupils are equal, round, and reactive to light.     Funduscopic exam:    Right eye: Red reflex present.        Left eye: Red reflex present.    Visual Fields: Right eye visual fields normal and left eye visual fields normal.  Neck:     Thyroid: No thyromegaly.     Vascular: No carotid bruit.  Cardiovascular:     Rate and Rhythm: Normal rate and regular rhythm.      Chest Wall: PMI is not displaced.     Pulses: Normal pulses.          Dorsalis pedis pulses are 2+ on the right side and 2+ on the left side.       Posterior tibial pulses are 2+ on the right side and 2+ on the left side.     Heart sounds: Normal heart sounds. No murmur heard. Pulmonary:     Effort: Pulmonary effort is normal. No respiratory distress.     Breath sounds: Normal breath sounds.  Abdominal:     General: Abdomen is flat. Bowel sounds are normal. There is no distension.     Palpations: Abdomen is soft. There is no hepatomegaly, splenomegaly or mass.     Tenderness: There is no abdominal tenderness. There is no right CVA tenderness, left CVA tenderness, guarding or rebound.  Musculoskeletal:        General: Normal range of motion.     Cervical back: Full passive range of motion without pain, normal range of motion and neck supple. No tenderness.     Right lower leg: No edema.     Left lower leg: No edema.  Feet:     Left foot:     Toenail Condition: Left toenails are normal.  Lymphadenopathy:     Cervical: No cervical adenopathy.     Upper Body:     Right upper body: No supraclavicular adenopathy.     Left upper body: No supraclavicular adenopathy.  Skin:    General: Skin is warm and dry.     Capillary Refill: Capillary refill takes less than 2 seconds.     Nails:  There is no clubbing.  Neurological:     General: No focal deficit present.     Mental Status: She is alert and oriented to person, place, and time.     GCS: GCS eye subscore is 4. GCS verbal subscore is 5. GCS motor subscore is 6.     Sensory: Sensation is intact.     Motor: Motor function is intact.     Coordination: Coordination is intact.     Gait: Gait is intact.     Deep Tendon Reflexes: Reflexes are normal and symmetric.  Psychiatric:        Attention and Perception: Attention normal.        Mood and Affect: Mood normal.        Speech: Speech normal.        Behavior: Behavior normal. Behavior  is cooperative.        Thought Content: Thought content normal.        Cognition and Memory: Cognition and memory normal.        Judgment: Judgment normal.      Results for orders placed or performed in visit on 06/30/24  CBC with Differential/Platelet   Collection Time: 06/30/24 10:18 AM  Result Value Ref Range   WBC 4.1 3.4 - 10.8 x10E3/uL   RBC 4.62 3.77 - 5.28 x10E6/uL   Hemoglobin 13.5 11.1 - 15.9 g/dL   Hematocrit 58.0 65.9 - 46.6 %   MCV 91 79 - 97 fL   MCH 29.2 26.6 - 33.0 pg   MCHC 32.2 31.5 - 35.7 g/dL   RDW 87.3 88.2 - 84.5 %   Platelets 278 150 - 450 x10E3/uL   Neutrophils 64 Not Estab. %   Lymphs 25 Not Estab. %   Monocytes 8 Not Estab. %   Eos 2 Not Estab. %   Basos 1 Not Estab. %   Neutrophils Absolute 2.6 1.4 - 7.0 x10E3/uL   Lymphocytes Absolute 1.0 0.7 - 3.1 x10E3/uL   Monocytes Absolute 0.3 0.1 - 0.9 x10E3/uL   EOS (ABSOLUTE) 0.1 0.0 - 0.4 x10E3/uL   Basophils Absolute 0.1 0.0 - 0.2 x10E3/uL   Immature Granulocytes 0 Not Estab. %   Immature Grans (Abs) 0.0 0.0 - 0.1 x10E3/uL  CMP14+EGFR   Collection Time: 06/30/24 10:18 AM  Result Value Ref Range   Glucose 83 70 - 99 mg/dL   BUN 20 8 - 27 mg/dL   Creatinine, Ser 9.06 0.57 - 1.00 mg/dL   eGFR 69 >40 fO/fpw/8.26   BUN/Creatinine Ratio 22 12 - 28   Sodium 141 134 - 144 mmol/L   Potassium 4.0 3.5 - 5.2 mmol/L   Chloride 102 96 - 106 mmol/L   CO2 22 20 - 29 mmol/L   Calcium  10.1 8.7 - 10.3 mg/dL   Total Protein 7.1 6.0 - 8.5 g/dL   Albumin 4.5 3.9 - 4.9 g/dL   Globulin, Total 2.6 1.5 - 4.5 g/dL   Bilirubin Total 0.4 0.0 - 1.2 mg/dL   Alkaline Phosphatase 76 49 - 135 IU/L   AST 19 0 - 40 IU/L   ALT 14 0 - 32 IU/L  Lipid panel   Collection Time: 06/30/24 10:18 AM  Result Value Ref Range   Cholesterol, Total 294 (H) 100 - 199 mg/dL   Triglycerides 891 0 - 149 mg/dL   HDL 55 >60 mg/dL   VLDL Cholesterol Cal 19 5 - 40 mg/dL   LDL Chol Calc (NIH) 779 (H) 0 - 99 mg/dL   LDL  CALC COMMENT: Comment     Chol/HDL Ratio 5.3 (H) 0.0 - 4.4 ratio       Assessment & Plan:   Problem List Items Addressed This Visit     Migraine without aura   Intermittent migraines, sometimes occurring on consecutive days. She is cautious with medication use due to frequency of migraines. - Renew Maxalt  (rizatriptan ) prescription.      Relevant Medications   rizatriptan  (MAXALT ) 10 MG tablet   traZODone  (DESYREL ) 50 MG tablet   methocarbamol  (ROBAXIN ) 500 MG tablet   Hyperlipidemia   Chronic. Last year cardiac CT scoring of zero. Continue with diet and exercise modification. Will plan to repeat CT calcium  score in 2029      Relevant Orders   CBC with Differential/Platelet (Completed)   CMP14+EGFR (Completed)   Lipid panel (Completed)   Insomnia   Chronic insomnia, exacerbated by stress and caffeine sensitivity. Previously prescribed sleeping pills, but she prefers to minimize use. Discussed trazodone  as a non-addictive alternative for sleep aid. Magnesium glycinate suggested for restless legs and sleep support. - Prescribe trazodone  for insomnia as needed. - Recommend magnesium glycinate 400-500 mg at bedtime for sleep support and restless legs.      Relevant Medications   traZODone  (DESYREL ) 50 MG tablet   zaleplon  (SONATA ) 10 MG capsule   Encounter for annual physical exam - Primary   CPE completed today. Review of HM activities and recommendations discussed and provided on AVS. Anticipatory guidance, diet, and exercise recommendations provided. Medications, allergies, and hx reviewed and updated as necessary. Orders placed as listed below.  Plan: - Labs ordered. Will make changes as necessary based on results.  - I will review these results and send recommendations via MyChart or a telephone call.  - F/U with CPE in 1 year or sooner for acute/chronic health needs as directed.        Malignant neoplasm of upper-outer quadrant of left breast in female, estrogen receptor positive (HCC)   Breast  cancer diagnosed in January, status post surgery and radiation. Currently cancer-free. On letrozole  (Femara ) for hormone therapy. Experiencing menopausal symptoms and fatigue as side effects of letrozole . Discussed the importance of continuing letrozole  for 5-7 years to reduce recurrence risk. Shared decision-making with her regarding the duration of hormone therapy. - Continue letrozole  (Femara ) for 5-7 years. - Follow up with oncologist Dr. Lanny next month.      Allergic rhinitis   Chronic allergic rhinitis with exacerbations, particularly in the fall. Currently on Singulair . Discussed potential for medication tolerance and alternative treatments. Levocetirizine (Xyzal ) and azelastine  nasal spray discussed as additional or alternative treatments. Advised on the sedative effect of levocetirizine and recommended bedtime dosing. - Prescribe levocetirizine (Xyzal ) for allergic rhinitis, advise taking at bedtime. - Prescribe azelastine  nasal spray as an alternative to Flonase . - Renew Singulair  prescription.      Relevant Medications   montelukast  (SINGULAIR ) 10 MG tablet   levocetirizine (XYZAL ) 5 MG tablet   azelastine  (ASTELIN ) 0.1 % nasal spray   Menopause   Experiencing menopausal symptoms including fatigue, dizziness, and vaginal dryness. Using coconut oil for vaginal dryness with good effect. Discussed the impact of letrozole  on menopausal symptoms and metabolism. Advised on dietary modifications and hydration to manage symptoms. - Continue using coconut oil for vaginal dryness. - Encourage dietary modifications to manage weight and menopausal symptoms.      Bilateral occipital neuralgia   Chronic neck pain with occipital neuralgia, likely due to cervical spondylosis. Symptoms include headaches and neck stiffness,  exacerbated by poor posture and sleep positioning. Discussed ergonomic adjustments and exercises to alleviate symptoms. Suggested use of a neck traction device for symptom  relief. - Recommend ergonomic adjustments and daily neck exercises. - Suggest use of a neck traction device for 10 minutes daily. - Consider prescribing a gentle muscle relaxer for severe episodes.      Relevant Medications   rizatriptan  (MAXALT ) 10 MG tablet   traZODone  (DESYREL ) 50 MG tablet   zaleplon  (SONATA ) 10 MG capsule   methocarbamol  (ROBAXIN ) 500 MG tablet   Osteoporosis   Relevant Orders   CBC with Differential/Platelet (Completed)   CMP14+EGFR (Completed)   Lipid panel (Completed)   Other Visit Diagnoses       Need for vaccination against Streptococcus pneumoniae       Relevant Orders   Pneumococcal conjugate vaccine 20-valent (Prevnar 20) (Completed)     Need for influenza vaccination       Relevant Orders   Flu vaccine trivalent PF, 6mos and older(Flulaval,Afluria,Fluarix,Fluzone) (Completed)          Follow up plan: Return in about 1 year (around 06/30/2025) for CPE.  NEXT PREVENTATIVE PHYSICAL DUE IN 1 YEAR.  PATIENT COUNSELING PROVIDED FOR ALL ADULT PATIENTS: A well balanced diet low in saturated fats, cholesterol, and moderation in carbohydrates.  This can be as simple as monitoring portion sizes and cutting back on sugary beverages such as soda and juice to start with.    Daily water consumption of at least 64 ounces.  Physical activity at least 180 minutes per week.  If just starting out, start 10 minutes a day and work your way up.   This can be as simple as taking the stairs instead of the elevator and walking 2-3 laps around the office  purposefully every day.   STD protection, partner selection, and regular testing if high risk.  Limited consumption of alcoholic beverages if alcohol is consumed. For men, I recommend no more than 14 alcoholic beverages per week, spread out throughout the week (max 2 per day). Avoid binge drinking or consuming large quantities of alcohol in one setting.  Please let me know if you feel you may need help with  reduction or quitting alcohol consumption.   Avoidance of nicotine, if used. Please let me know if you feel you may need help with reduction or quitting nicotine use.   Daily mental health attention. This can be in the form of 5 minute daily meditation, prayer, journaling, yoga, reflection, etc.  Purposeful attention to your emotions and mental state can significantly improve your overall wellbeing  and  Health.  Please know that I am here to help you with all of your health care goals and am happy to work with you to find a solution that works best for you.  The greatest advice I have received with any changes in life are to take it one step at a time, that even means if all you can focus on is the next 60 seconds, then do that and celebrate your victories.  With any changes in life, you will have set backs, and that is OK. The important thing to remember is, if you have a set back, it is not a failure, it is an opportunity to try again! Screening Testing Mammogram Every 1 -2 years based on history and risk factors Starting at age 64 Pap Smear Ages 21-39 every 3 years Ages 7-65 every 5 years with HPV testing More frequent testing may be required based  on results and history Colon Cancer Screening Every 1-10 years based on test performed, risk factors, and history Starting at age 88 Bone Density Screening Every 2-10 years based on history Starting at age 67 for women Recommendations for men differ based on medication usage, history, and risk factors AAA Screening One time ultrasound Men 37-3 years old who have every smoked Lung Cancer Screening Low Dose Lung CT every 12 months Age 103-80 years with a 30 pack-year smoking history who still smoke or who have quit within the last 15 years   Screening Labs Routine  Labs: Complete Blood Count (CBC), Complete Metabolic Panel (CMP), Cholesterol (Lipid Panel) Every 6-12 months based on history and medications May be recommended more  frequently based on current conditions or previous results Hemoglobin A1c Lab Every 3-12 months based on history and previous results Starting at age 29 or earlier with diagnosis of diabetes, high cholesterol, BMI >26, and/or risk factors Frequent monitoring for patients with diabetes to ensure blood sugar control Thyroid Panel (TSH) Every 6 months based on history, symptoms, and risk factors May be repeated more often if on medication HIV One time testing for all patients 46 and older May be repeated more frequently for patients with increased risk factors or exposure Hepatitis C One time testing for all patients 27 and older May be repeated more frequently for patients with increased risk factors or exposure Gonorrhea, Chlamydia Every 12 months for all sexually active persons 13-24 years Additional monitoring may be recommended for those who are considered high risk or who have symptoms Every 12 months for any woman on birth control, regardless of sexual activity PSA Men 68-60 years old with risk factors Additional screening may be recommended from age 27-69 based on risk factors, symptoms, and history  Vaccine Recommendations Tetanus Booster All adults every 10 years Flu Vaccine All patients 6 months and older every year COVID Vaccine All patients 12 years and older Initial dosing with booster May recommend additional booster based on age and health history HPV Vaccine 2 doses all patients age 9-26 Dosing may be considered for patients over 26 Shingles Vaccine (Shingrix ) 2 doses all adults 55 years and older Pneumonia (Pneumovax 23) All adults 65 years and older May recommend earlier dosing based on health history One year apart from Prevnar 60 Pneumonia (Prevnar 79) All adults 65 years and older Dosed 1 year after Pneumovax 23 Pneumonia (Prevnar 20) One time alternative to the two dosing of 13 and 23 For all adults with initial dose of 23, 20 is recommended 1 year  later For all adults with initial dose of 13, 23 is still recommended as second option 1 year later

## 2024-06-30 ENCOUNTER — Encounter: Payer: Self-pay | Admitting: Nurse Practitioner

## 2024-06-30 ENCOUNTER — Ambulatory Visit: Payer: Managed Care, Other (non HMO) | Admitting: Nurse Practitioner

## 2024-06-30 VITALS — BP 122/80 | HR 68 | Ht 63.0 in | Wt 140.8 lb

## 2024-06-30 DIAGNOSIS — G43009 Migraine without aura, not intractable, without status migrainosus: Secondary | ICD-10-CM | POA: Diagnosis not present

## 2024-06-30 DIAGNOSIS — M810 Age-related osteoporosis without current pathological fracture: Secondary | ICD-10-CM

## 2024-06-30 DIAGNOSIS — Z170 Estrogen receptor positive status [ER+]: Secondary | ICD-10-CM

## 2024-06-30 DIAGNOSIS — Z0000 Encounter for general adult medical examination without abnormal findings: Secondary | ICD-10-CM

## 2024-06-30 DIAGNOSIS — E782 Mixed hyperlipidemia: Secondary | ICD-10-CM | POA: Diagnosis not present

## 2024-06-30 DIAGNOSIS — Z780 Asymptomatic menopausal state: Secondary | ICD-10-CM

## 2024-06-30 DIAGNOSIS — C50412 Malignant neoplasm of upper-outer quadrant of left female breast: Secondary | ICD-10-CM

## 2024-06-30 DIAGNOSIS — G4701 Insomnia due to medical condition: Secondary | ICD-10-CM

## 2024-06-30 DIAGNOSIS — Z23 Encounter for immunization: Secondary | ICD-10-CM | POA: Diagnosis not present

## 2024-06-30 DIAGNOSIS — M5481 Occipital neuralgia: Secondary | ICD-10-CM

## 2024-06-30 DIAGNOSIS — J309 Allergic rhinitis, unspecified: Secondary | ICD-10-CM

## 2024-06-30 MED ORDER — METHOCARBAMOL 500 MG PO TABS: ORAL_TABLET | ORAL | 3 refills | Status: AC

## 2024-06-30 MED ORDER — MONTELUKAST SODIUM 10 MG PO TABS: 10.0000 mg | ORAL_TABLET | Freq: Every day | ORAL | 0 refills | Status: DC

## 2024-06-30 MED ORDER — AZELASTINE HCL 0.1 % NA SOLN: 2.0000 | Freq: Two times a day (BID) | NASAL | 12 refills | Status: AC

## 2024-06-30 MED ORDER — TRAZODONE HCL 50 MG PO TABS: 25.0000 mg | ORAL_TABLET | Freq: Every evening | ORAL | 1 refills | Status: AC | PRN

## 2024-06-30 MED ORDER — LEVOCETIRIZINE DIHYDROCHLORIDE 5 MG PO TABS
5.0000 mg | ORAL_TABLET | Freq: Every evening | ORAL | 3 refills | Status: AC
Start: 2024-06-30 — End: ?

## 2024-06-30 MED ORDER — ZALEPLON 10 MG PO CAPS: 10.0000 mg | ORAL_CAPSULE | Freq: Every evening | ORAL | 0 refills | Status: AC | PRN

## 2024-06-30 MED ORDER — RIZATRIPTAN BENZOATE 10 MG PO TABS: ORAL_TABLET | ORAL | 6 refills | Status: AC

## 2024-06-30 NOTE — Patient Instructions (Addendum)
 COVID vaccines are available at the local pharmacy if you wish to have these completed.   Magnesium glyconate 250-500mg  can be used at bedtime to help with sleep and restless legs.    I have sent in Trazodone  for you to try at bedtime. If this is not working well for you or you cannot fall asleep after 20 minutes you can take the zaleplon  (Sonata ).     For all adult patients, I recommend A well balanced diet low in saturated fats, cholesterol, and moderation in carbohydrates.   This can be as simple as monitoring portion sizes and cutting back on sugary beverages such as soda and juice to start with.    Daily water consumption of at least 64 ounces.  Physical activity at least 180 minutes per week, if just starting out.   This can be as simple as taking the stairs instead of the elevator and walking 2-3 laps around the office  purposefully every day.   STD protection, partner selection, and regular testing if high risk.  Limited consumption of alcoholic beverages if alcohol is consumed.  For women, I recommend no more than 7 alcoholic beverages per week, spread out throughout the week.  Avoid binge drinking or consuming large quantities of alcohol in one setting.   Please let me know if you feel you may need help with reduction or quitting alcohol consumption.   Avoidance of nicotine, if used.  Please let me know if you feel you may need help with reduction or quitting nicotine use.   Daily mental health attention.  This can be in the form of 5 minute daily meditation, prayer, journaling, yoga, reflection, etc.   Purposeful attention to your emotions and mental state can significantly improve your overall wellbeing  and  Health.  Please know that I am here to help you with all of your health care goals and am happy to work with you to find a solution that works best for you.  The greatest advice I have received with any changes in life are to take it one step at a time, that even  means if all you can focus on is the next 60 seconds, then do that and celebrate your victories.  With any changes in life, you will have set backs, and that is OK. The important thing to remember is, if you have a set back, it is not a failure, it is an opportunity to try again!  Health Maintenance Recommendations Screening Testing Mammogram Every 1 -2 years based on history and risk factors Starting at age 88 Pap Smear Ages 21-39 every 3 years Ages 81-65 every 5 years with HPV testing More frequent testing may be required based on results and history Colon Cancer Screening Every 1-10 years based on test performed, risk factors, and history Starting at age 49 Bone Density Screening Every 2-10 years based on history Starting at age 48 for women Recommendations for men differ based on medication usage, history, and risk factors AAA Screening One time ultrasound Men 71-18 years old who have every smoked Lung Cancer Screening Low Dose Lung CT every 12 months Age 72-80 years with a 30 pack-year smoking history who still smoke or who have quit within the last 15 years  Screening Labs Routine  Labs: Complete Blood Count (CBC), Complete Metabolic Panel (CMP), Cholesterol (Lipid Panel) Every 6-12 months based on history and medications May be recommended more frequently based on current conditions or previous results Hemoglobin A1c Lab Every 3-12 months  based on history and previous results Starting at age 35 or earlier with diagnosis of diabetes, high cholesterol, BMI >26, and/or risk factors Frequent monitoring for patients with diabetes to ensure blood sugar control Thyroid Panel (TSH w/ T3 & T4) Every 6 months based on history, symptoms, and risk factors May be repeated more often if on medication HIV One time testing for all patients 11 and older May be repeated more frequently for patients with increased risk factors or exposure Hepatitis C One time testing for all patients 30  and older May be repeated more frequently for patients with increased risk factors or exposure Gonorrhea, Chlamydia Every 12 months for all sexually active persons 13-24 years Additional monitoring may be recommended for those who are considered high risk or who have symptoms PSA Men 79-1 years old with risk factors Additional screening may be recommended from age 8-69 based on risk factors, symptoms, and history  Vaccine Recommendations Tetanus Booster All adults every 10 years Flu Vaccine All patients 6 months and older every year COVID Vaccine All patients 12 years and older Initial dosing with booster May recommend additional booster based on age and health history HPV Vaccine 2 doses all patients age 48-26 Dosing may be considered for patients over 26 Shingles Vaccine (Shingrix ) 2 doses all adults 55 years and older Pneumonia (Pneumovax 23) All adults 65 years and older May recommend earlier dosing based on health history Pneumonia (Prevnar 96) All adults 65 years and older Dosed 1 year after Pneumovax 23  Additional Screening, Testing, and Vaccinations may be recommended on an individualized basis based on family history, health history, risk factors, and/or exposure.

## 2024-07-01 LAB — CMP14+EGFR
ALT: 14 IU/L (ref 0–32)
AST: 19 IU/L (ref 0–40)
Albumin: 4.5 g/dL (ref 3.9–4.9)
Alkaline Phosphatase: 76 IU/L (ref 49–135)
BUN/Creatinine Ratio: 22 (ref 12–28)
BUN: 20 mg/dL (ref 8–27)
Bilirubin Total: 0.4 mg/dL (ref 0.0–1.2)
CO2: 22 mmol/L (ref 20–29)
Calcium: 10.1 mg/dL (ref 8.7–10.3)
Chloride: 102 mmol/L (ref 96–106)
Creatinine, Ser: 0.93 mg/dL (ref 0.57–1.00)
Globulin, Total: 2.6 g/dL (ref 1.5–4.5)
Glucose: 83 mg/dL (ref 70–99)
Potassium: 4 mmol/L (ref 3.5–5.2)
Sodium: 141 mmol/L (ref 134–144)
Total Protein: 7.1 g/dL (ref 6.0–8.5)
eGFR: 69 mL/min/1.73 (ref >59)

## 2024-07-01 LAB — CBC WITH DIFFERENTIAL/PLATELET
Basophils Absolute: 0.1 x10E3/uL (ref 0.0–0.2)
Basos: 1 %
EOS (ABSOLUTE): 0.1 x10E3/uL (ref 0.0–0.4)
Eos: 2 %
Hematocrit: 41.9 % (ref 34.0–46.6)
Hemoglobin: 13.5 g/dL (ref 11.1–15.9)
Immature Grans (Abs): 0 x10E3/uL (ref 0.0–0.1)
Immature Granulocytes: 0 %
Lymphocytes Absolute: 1 x10E3/uL (ref 0.7–3.1)
Lymphs: 25 %
MCH: 29.2 pg (ref 26.6–33.0)
MCHC: 32.2 g/dL (ref 31.5–35.7)
MCV: 91 fL (ref 79–97)
Monocytes Absolute: 0.3 x10E3/uL (ref 0.1–0.9)
Monocytes: 8 %
Neutrophils Absolute: 2.6 x10E3/uL (ref 1.4–7.0)
Neutrophils: 64 %
Platelets: 278 x10E3/uL (ref 150–450)
RBC: 4.62 x10E6/uL (ref 3.77–5.28)
RDW: 12.6 % (ref 11.7–15.4)
WBC: 4.1 x10E3/uL (ref 3.4–10.8)

## 2024-07-01 LAB — LIPID PANEL
Chol/HDL Ratio: 5.3 ratio — AB (ref 0.0–4.4)
Cholesterol, Total: 294 mg/dL — AB (ref 100–199)
HDL: 55 mg/dL (ref >39)
LDL Chol Calc (NIH): 220 mg/dL — AB (ref 0–99)
Triglycerides: 108 mg/dL (ref 0–149)
VLDL Cholesterol Cal: 19 mg/dL (ref 5–40)

## 2024-07-07 ENCOUNTER — Ambulatory Visit: Payer: Self-pay | Admitting: Nurse Practitioner

## 2024-07-07 DIAGNOSIS — Z780 Asymptomatic menopausal state: Secondary | ICD-10-CM | POA: Insufficient documentation

## 2024-07-07 DIAGNOSIS — M5481 Occipital neuralgia: Secondary | ICD-10-CM | POA: Insufficient documentation

## 2024-07-07 NOTE — Assessment & Plan Note (Signed)

## 2024-07-07 NOTE — Assessment & Plan Note (Signed)
 Chronic allergic rhinitis with exacerbations, particularly in the fall. Currently on Singulair . Discussed potential for medication tolerance and alternative treatments. Levocetirizine (Xyzal ) and azelastine  nasal spray discussed as additional or alternative treatments. Advised on the sedative effect of levocetirizine and recommended bedtime dosing. - Prescribe levocetirizine (Xyzal ) for allergic rhinitis, advise taking at bedtime. - Prescribe azelastine  nasal spray as an alternative to Flonase . - Renew Singulair  prescription.

## 2024-07-07 NOTE — Assessment & Plan Note (Signed)
 Chronic insomnia, exacerbated by stress and caffeine sensitivity. Previously prescribed sleeping pills, but she prefers to minimize use. Discussed trazodone  as a non-addictive alternative for sleep aid. Magnesium glycinate suggested for restless legs and sleep support. - Prescribe trazodone  for insomnia as needed. - Recommend magnesium glycinate 400-500 mg at bedtime for sleep support and restless legs.

## 2024-07-07 NOTE — Assessment & Plan Note (Signed)
 Chronic neck pain with occipital neuralgia, likely due to cervical spondylosis. Symptoms include headaches and neck stiffness, exacerbated by poor posture and sleep positioning. Discussed ergonomic adjustments and exercises to alleviate symptoms. Suggested use of a neck traction device for symptom relief. - Recommend ergonomic adjustments and daily neck exercises. - Suggest use of a neck traction device for 10 minutes daily. - Consider prescribing a gentle muscle relaxer for severe episodes.

## 2024-07-07 NOTE — Assessment & Plan Note (Signed)
 Chronic. Last year cardiac CT scoring of zero. Continue with diet and exercise modification. Will plan to repeat CT calcium  score in 2029

## 2024-07-07 NOTE — Assessment & Plan Note (Signed)
 Experiencing menopausal symptoms including fatigue, dizziness, and vaginal dryness. Using coconut oil for vaginal dryness with good effect. Discussed the impact of letrozole  on menopausal symptoms and metabolism. Advised on dietary modifications and hydration to manage symptoms. - Continue using coconut oil for vaginal dryness. - Encourage dietary modifications to manage weight and menopausal symptoms.

## 2024-07-07 NOTE — Assessment & Plan Note (Signed)
 Breast cancer diagnosed in January, status post surgery and radiation. Currently cancer-free. On letrozole  (Femara ) for hormone therapy. Experiencing menopausal symptoms and fatigue as side effects of letrozole . Discussed the importance of continuing letrozole  for 5-7 years to reduce recurrence risk. Shared decision-making with her regarding the duration of hormone therapy. - Continue letrozole  (Femara ) for 5-7 years. - Follow up with oncologist Dr. Lanny next month.

## 2024-07-07 NOTE — Assessment & Plan Note (Signed)
 Intermittent migraines, sometimes occurring on consecutive days. She is cautious with medication use due to frequency of migraines. - Renew Maxalt  (rizatriptan ) prescription.

## 2024-09-01 ENCOUNTER — Inpatient Hospital Stay

## 2024-09-01 ENCOUNTER — Inpatient Hospital Stay: Admitting: Nurse Practitioner

## 2024-09-01 ENCOUNTER — Other Ambulatory Visit

## 2024-09-01 ENCOUNTER — Ambulatory Visit: Admitting: Hematology

## 2024-09-02 ENCOUNTER — Other Ambulatory Visit: Payer: Self-pay

## 2024-09-02 ENCOUNTER — Other Ambulatory Visit: Payer: Self-pay | Admitting: Nurse Practitioner

## 2024-09-02 DIAGNOSIS — C50412 Malignant neoplasm of upper-outer quadrant of left female breast: Secondary | ICD-10-CM

## 2024-09-02 NOTE — Progress Notes (Signed)
 Patient Care Team: Early, Camie BRAVO, NP as PCP - General (Nurse Practitioner) Wendel Lurena POUR, MD as PCP - Cardiology (Cardiology) Skeet Juliene SAUNDERS, DO as Consulting Physician (Neurology) Tyree Nanetta SAILOR, RN as Oncology Nurse Navigator Lanny Callander, MD as Consulting Physician (Hematology) Shannon Agent, MD as Consulting Physician (Radiation Oncology) Ebbie Cough, MD as Consulting Physician (General Surgery) Burton, Lacie K, NP as Nurse Practitioner (Nurse Practitioner)  Clinic Day:  09/04/2024  Referring physician: Oris Camie BRAVO, NP  ASSESSMENT & PLAN:   Assessment & Plan: Malignant neoplasm of upper-outer quadrant of left breast in female, 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   - Bone density test managed through GYN office. - She is taking letrozole  daily.  Tolerating well. -Plan for labs and follow-up in 4 months, sooner if needed.   Bone health Patient reports DEXA scans performed at GYN office.  Prior studies have been normal.  Recommend she take daily dose of calcium  and vitamin D .  Recommend routine activities with weightbearing exercises.  Limit fall risks in her home.  DEXA scans should be done every 2 years.  Left breast cancer, ER + Patient tolerating letrozole  well.  Breast exam is benign today.  Bilateral diagnostic mammogram scheduled 09/18/2024.   Plan Labs reviewed. - CBC and CMP unremarkable. Diagnostic mammogram scheduled for 09/18/2024. DEXA scan is managed through GYN office. Continue letrozole  daily. Plan for labs and follow-up in 4 months, sooner if needed.  The patient understands the plans discussed today and is in agreement with them.  She  knows to contact our office if she develops concerns prior to her next appointment.  I provided 25 minutes of face-to-face time during this encounter and > 50% was spent counseling as documented under my assessment and plan.    Powell BRAVO Lessen, NP  [facility] - A DEPT OF [facility] [address] [place] [address] Dept: [phone] Dept Fax: [phone]   No orders of the defined types were placed in this encounter.     CHIEF COMPLAINT:  CC: Left breast cancer, ER +  Current Treatment: Letrozole  daily  INTERVAL HISTORY:  Tamara Lutz is here today for repeat clinical assessment. She last saw Lacie, NP on 05/25/2024 for survivorship visit.  She is scheduled for diagnostic mammogram on 09/18/2024.  States that DEXA scans are managed through her GYN office.  She is taking letrozole  daily.  Tolerating well.  Denies unusual hot flashes or night sweats.  Denies unusual joint or bone pain.  She denies chest pain, chest pressure, or shortness of breath. She denies headaches or visual disturbances.  She does have baseline migraine headaches which are manageable with prescription medications.  She denies abdominal pain, nausea, vomiting, or changes in bowel or bladder habits.    She denies fevers or chills. She denies pain. Her appetite is good. Her weight has decreased 6 pounds over last 2 months.  I have reviewed the past medical history, past surgical history, social history and family history with the patient and they are unchanged from previous note.  ALLERGIES:  is allergic to gabapentin .  MEDICATIONS:  Current Outpatient Medications  Medication Sig Dispense Refill   aspirin-acetaminophen -caffeine (  EXCEDRIN MIGRAINE) 250-250-65 MG tablet Take by mouth every 6 (six) hours as needed for headache.     azelastine  (ASTELIN ) 0.1 % nasal spray Place 2 sprays into both nostrils 2 (two) times daily. Use in each nostril as  directed 30 mL 12   Calcium  Carb-Cholecalciferol 600-400 MG-UNIT CAPS Take 1 capsule by mouth.     Cholecalciferol (VITAMIN D ) 50 MCG (2000 UT) CAPS Take by mouth.     ibandronate (BONIVA) 150 MG tablet Take 150 mg by mouth every 30 (thirty) days. Take in the morning with a full glass of water, on an empty stomach, and do not take anything else by mouth or lie down for the next 30 min.     letrozole  (FEMARA ) 2.5 MG tablet Take 1 tablet (2.5 mg total) by mouth daily. 90 tablet 1   levocetirizine (XYZAL ) 5 MG tablet Take 1 tablet (5 mg total) by mouth every evening. For allergies 90 tablet 3   Magnesium 500 MG CAPS Take by mouth.     methocarbamol  (ROBAXIN ) 500 MG tablet Take 1 tablet by mouth as needed for neck spasms and headache. 30 tablet 3   Multiple Vitamin (MULTIVITAMIN WITH MINERALS) TABS tablet Take 1 tablet by mouth daily.     Multiple Vitamin (MULTIVITAMIN) tablet Take 1 tablet by mouth daily.     rizatriptan  (MAXALT ) 10 MG tablet Take 10 mg (1 tablet total) by mouth at the start of the headache. May repeat in 2 hours x 1 if headache persists. Max of 2 tablets/24 hours. 12 tablet 6   traZODone  (DESYREL ) 50 MG tablet Take 0.5-2 tablets (25-100 mg total) by mouth at bedtime as needed for sleep. 90 tablet 1   zaleplon  (SONATA ) 10 MG capsule Take 1 capsule (10 mg total) by mouth at bedtime as needed for sleep (if trazodone  is not effective). 90 capsule 0   montelukast  (SINGULAIR ) 10 MG tablet Take 1 tablet (10 mg total) by mouth at bedtime. 90 tablet 0   No current facility-administered medications for this visit.    HISTORY OF PRESENT ILLNESS:   Oncology History  Malignant neoplasm of upper-outer quadrant of left breast in female, estrogen receptor positive (HCC)  06/26/2018 Genetic Testing   Negative genetic testing on the [facility] panel through [facility]. GALANT1 2 VUS identified. The report date is June 26, 2018.  The [facility] gene panel offered by [employer] includes sequencing and deletion/duplication testing of the following 35 genes: APC, ATM, AXIN2, BARD1, BMPR1A, BRCA1, BRCA2, BRIP1, CHD1, CDK4, CDKN2A, CHEK2, EPCAM (large rearrangement only), HOXB13, (sequencing only), GALNT12, MLH1, MSH2, MSH3 (excluding repetitive portions of exon 1), MSH6, MUTYH, NBN, NTHL1, PALB2, PMS2, PTEN, RAD51C, RAD51D, RNF43, RPS20, SMAD4, STK11, and TP53. Sequencing was performed for select regions of POLE and POLD1, and large rearrangement analysis was performed for select regions of GREM1.    10/18/2023 Cancer Staging   Staging form: Breast, AJCC 8th Edition - Clinical stage from 10/18/2023: Stage IA (cT1b, cN0, cM0, G2, ER+, PR+, HER2-) - Signed by Lanny Callander, MD on 10/29/2023 Stage prefix: Initial diagnosis Histologic grading system: 3 grade system   10/24/2023 Initial Diagnosis   Malignant neoplasm of upper-outer quadrant of left breast in female, estrogen receptor positive (HCC)   12/03/2023 Cancer Staging   Staging form: Breast, AJCC 8th Edition - Pathologic stage from 12/03/2023: Stage IA (pT1c, pN0, cM0, G2, ER+, PR+, HER2-) - Signed by Lanny Callander, MD on 01/27/2024 Histologic grading system: 3 grade system Residual tumor (R):  R0       REVIEW OF SYSTEMS:   Constitutional: Denies fevers, chills or abnormal weight loss Eyes: Denies blurriness of vision Ears, nose, mouth, throat, and face: Denies mucositis or sore throat Respiratory: Denies cough, dyspnea or wheezes Cardiovascular: Denies palpitation, chest discomfort or lower extremity swelling Gastrointestinal:  Denies nausea, heartburn or change in bowel habits Skin: Denies abnormal skin rashes Lymphatics: Denies new lymphadenopathy or easy bruising Neurological:Denies numbness, tingling or new weaknesses Behavioral/Psych: Mood is stable, no new changes  All other systems were reviewed with the patient and are negative.   VITALS:   Today's Vitals   09/04/24 1102 09/04/24 1103  BP:  134/86   Pulse: 67   Resp: 17   Temp: (!) 97.3 F (36.3 C)   SpO2: 100%   Weight: 137 lb 11.2 oz (62.5 kg)   PainSc:  0-No pain   Body mass index is 24.39 kg/m.    Wt Readings from Last 3 Encounters:  09/04/24 137 lb 11.2 oz (62.5 kg)  06/30/24 140 lb 12.8 oz (63.9 kg)  06/15/24 143 lb 6 oz (65 kg)    Body mass index is 24.39 kg/m.  Performance status (ECOG): 1 - Symptomatic but completely ambulatory  PHYSICAL EXAM:   GENERAL:alert, no distress and comfortable SKIN: skin color, texture, turgor are normal, no rashes or significant lesions EYES: normal, Conjunctiva are pink and non-injected, sclera clear OROPHARYNX:no exudate, no erythema and lips, buccal mucosa, and tongue normal  NECK: supple, thyroid normal size, non-tender, without nodularity LYMPH:  no palpable lymphadenopathy in the cervical, axillary or inguinal LUNGS: clear to auscultation and percussion with normal breathing effort HEART: regular rate & rhythm and no murmurs and no lower extremity edema ABDOMEN:abdomen soft, non-tender and normal bowel sounds Musculoskeletal:no cyanosis of digits and no clubbing  NEURO: alert & oriented x 3 with fluent speech, no focal motor/sensory deficits BREAST: Left breast has well-healed lumpectomy scar along the outer upper quadrant.  No palpable lumps or masses noted on today's exam.  There is no nipple inversion or nipple discharge.  There is no axillary lymphadenopathy on the left side.  There are no palpable masses or lumps breast.  There is no nipple inversion or nipple discharge.  There is no axillary lymphadenopathy on the right.   LABORATORY DATA:  I have reviewed the data as listed    Component Value Date/Time   NA 143 09/04/2024 1050   NA 141 06/30/2024 1018   K 3.9 09/04/2024 1050   CL 106 09/04/2024 1050   CO2 29 09/04/2024 1050   GLUCOSE 67 (L) 09/04/2024 1050   BUN 16 09/04/2024 1050   BUN 20 06/30/2024 1018   CREATININE 0.86 09/04/2024 1050   CALCIUM   10.3 09/04/2024 1050   PROT 7.4 09/04/2024 1050   PROT 7.1 06/30/2024 1018   ALBUMIN 4.3 09/04/2024 1050   ALBUMIN 4.5 06/30/2024 1018   AST 22 09/04/2024 1050   ALT 13 09/04/2024 1050   ALKPHOS 81 09/04/2024 1050   BILITOT 0.3 09/04/2024 1050   GFRNONAA >60 09/04/2024 1050   GFRAA 82 10/13/2020 1554    Lab Results  Component Value Date   WBC 4.5 09/04/2024   NEUTROABS 2.8 09/04/2024   HGB 13.0 09/04/2024   HCT 39.7 09/04/2024   MCV 87.8 09/04/2024   PLT 256 09/04/2024

## 2024-09-02 NOTE — Assessment & Plan Note (Addendum)
 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   - Bone density test managed through GYN office. - She is taking letrozole  daily.  Tolerating well. -Plan for labs and follow-up in 4 months, sooner if needed.

## 2024-09-04 ENCOUNTER — Inpatient Hospital Stay (HOSPITAL_BASED_OUTPATIENT_CLINIC_OR_DEPARTMENT_OTHER): Admitting: Nurse Practitioner

## 2024-09-04 ENCOUNTER — Inpatient Hospital Stay: Attending: Nurse Practitioner

## 2024-09-04 VITALS — BP 134/86 | HR 67 | Temp 97.3°F | Resp 17 | Wt 137.7 lb

## 2024-09-04 DIAGNOSIS — C50412 Malignant neoplasm of upper-outer quadrant of left female breast: Secondary | ICD-10-CM | POA: Diagnosis present

## 2024-09-04 DIAGNOSIS — Z79811 Long term (current) use of aromatase inhibitors: Secondary | ICD-10-CM | POA: Insufficient documentation

## 2024-09-04 DIAGNOSIS — Z170 Estrogen receptor positive status [ER+]: Secondary | ICD-10-CM

## 2024-09-04 DIAGNOSIS — Z1721 Progesterone receptor positive status: Secondary | ICD-10-CM | POA: Insufficient documentation

## 2024-09-04 DIAGNOSIS — Z1732 Human epidermal growth factor receptor 2 negative status: Secondary | ICD-10-CM | POA: Diagnosis not present

## 2024-09-04 LAB — CBC WITH DIFFERENTIAL (CANCER CENTER ONLY)
Abs Immature Granulocytes: 0.02 K/uL (ref 0.00–0.07)
Basophils Absolute: 0.1 K/uL (ref 0.0–0.1)
Basophils Relative: 1 %
Eosinophils Absolute: 0.2 K/uL (ref 0.0–0.5)
Eosinophils Relative: 4 %
HCT: 39.7 % (ref 36.0–46.0)
Hemoglobin: 13 g/dL (ref 12.0–15.0)
Immature Granulocytes: 0 %
Lymphocytes Relative: 25 %
Lymphs Abs: 1.1 K/uL (ref 0.7–4.0)
MCH: 28.8 pg (ref 26.0–34.0)
MCHC: 32.7 g/dL (ref 30.0–36.0)
MCV: 87.8 fL (ref 80.0–100.0)
Monocytes Absolute: 0.3 K/uL (ref 0.1–1.0)
Monocytes Relative: 6 %
Neutro Abs: 2.8 K/uL (ref 1.7–7.7)
Neutrophils Relative %: 64 %
Platelet Count: 256 K/uL (ref 150–400)
RBC: 4.52 MIL/uL (ref 3.87–5.11)
RDW: 12.4 % (ref 11.5–15.5)
WBC Count: 4.5 K/uL (ref 4.0–10.5)
nRBC: 0 % (ref 0.0–0.2)

## 2024-09-04 LAB — CMP (CANCER CENTER ONLY)
ALT: 13 U/L (ref 0–44)
AST: 22 U/L (ref 15–41)
Albumin: 4.3 g/dL (ref 3.5–5.0)
Alkaline Phosphatase: 81 U/L (ref 38–126)
Anion gap: 8 (ref 5–15)
BUN: 16 mg/dL (ref 8–23)
CO2: 29 mmol/L (ref 22–32)
Calcium: 10.3 mg/dL (ref 8.9–10.3)
Chloride: 106 mmol/L (ref 98–111)
Creatinine: 0.86 mg/dL (ref 0.44–1.00)
GFR, Estimated: >60 mL/min (ref >60)
Glucose, Bld: 67 mg/dL — ABNORMAL LOW (ref 70–99)
Potassium: 3.9 mmol/L (ref 3.5–5.1)
Sodium: 143 mmol/L (ref 135–145)
Total Bilirubin: 0.3 mg/dL (ref 0.0–1.2)
Total Protein: 7.4 g/dL (ref 6.5–8.1)

## 2024-09-12 ENCOUNTER — Other Ambulatory Visit: Payer: Self-pay | Admitting: Medical Genetics

## 2024-09-13 ENCOUNTER — Encounter: Payer: Self-pay | Admitting: Nurse Practitioner

## 2024-09-14 ENCOUNTER — Ambulatory Visit: Attending: General Surgery

## 2024-09-14 VITALS — Wt 138.0 lb

## 2024-09-14 DIAGNOSIS — Z483 Aftercare following surgery for neoplasm: Secondary | ICD-10-CM | POA: Insufficient documentation

## 2024-09-14 NOTE — Therapy (Addendum)
 OUTPATIENT PHYSICAL THERAPY SOZO SCREENING NOTE   Patient Name: Tamara Lutz MRN: [id number] DOB:[birth year], 63 y.o., female Today's Date: 09/14/2024  PCP: Oris Camie BRAVO, NP REFERRING PROVIDER: Oris Camie BRAVO, NP   PT End of Session - 09/14/24 1615     Visit Number 2   # unchanged due to screen only   PT Start Time 1605    PT Stop Time 1612    PT Time Calculation (min) 7 min    Activity Tolerance Patient tolerated treatment well    Behavior During Therapy [facility] for tasks assessed/performed          Past Medical History:  Diagnosis Date   Allergy    Basal cell carcinoma May 04, 1962   nod- upper right chest(CX35FU)   Breast cancer (HCC) 10/2023   left breast IDC   Headache    History of radiation therapy    Left breast-01/09/24-01/30/24-Dr. Lynwood Nasuti   Hyperlipidemia    Osteopenia    Past Surgical History:  Procedure Laterality Date   BREAST BIOPSY Left 10/18/2023   US  LT BREAST BX W LOC DEV 1ST LESION IMG BX SPEC US  GUIDE 10/18/2023 [facility]   BREAST BIOPSY  12/02/2023   MM LT RADIOACTIVE SEED LOC MAMMO GUIDE 12/02/2023 [facility]AMMO[facility]   BREAST LUMPECTOMY WITH RADIOACTIVE SEED AND SENTINEL LYMPH NODE BIOPSY Left 12/03/2023   Procedure: LEFT BREAST SEED GUIDED LUMPECTOMY, LEFT AXILLARY SENTINEL NODE BIOPSY;  Surgeon: Ebbie Cough, MD;  Location: [facility];  Service: General;  Laterality: Left;   CESAREAN SECTION     CESAREAN SECTION N/A    [employer] 07/31/2020   COLONOSCOPY  2019   Dr.Gupta   DILATION AND CURETTAGE OF UTERUS     FLEXIBLE SIGMOIDOSCOPY N/A 08/11/2021   Procedure: FLEXIBLE SIGMOIDOSCOPY;  Surgeon: Rollin Dover, MD;  Location: [facility];  Service: Endoscopy;  Laterality: N/A;   HEMOSTASIS CLIP PLACEMENT  08/11/2021   Procedure: HEMOSTASIS CLIP PLACEMENT;  Surgeon: Rollin Dover, MD;  Location: [facility];  Service: Endoscopy;;   MOUTH SURGERY     POLYPECTOMY     SCLEROTHERAPY  08/11/2021   Procedure:  MATIAS;  Surgeon: Rollin Dover, MD;  Location: [facility];  Service: Endoscopy;;   Patient Active Problem List   Diagnosis Date Noted   Menopause 07/07/2024   Bilateral occipital neuralgia 07/07/2024   Allergic rhinitis 06/30/2024   S/P breast biopsy, left 11/08/2023   Genetic testing 11/04/2023   Malignant neoplasm of upper-outer quadrant of left breast in female, estrogen receptor positive (HCC) 10/24/2023   Encounter for annual physical exam 06/26/2023   H/O colonoscopy with polypectomy    Insomnia 05/24/2021   Osteoporosis 06/02/2019   Migraine without aura 06/02/2019   Hyperlipidemia 06/02/2019   Irritable bowel syndrome 06/02/2019    REFERRING DIAG: left breast cancer at risk for lymphedema  THERAPY DIAG: Aftercare following surgery for neoplasm  PERTINENT HISTORY: Patient was diagnosed on 10/18/2023 with left 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 She underwent a left lumpectomy and sentinel node biopsy (3 negative nodes) on 12/03/2023. It is ER/PR positive and HER2 negative with a Ki67 of 10%. 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   PRECAUTIONS: left UE Lymphedema risk, None  SUBJECTIVE: Pt returns for her 3 month L-Dex screen.   PAIN:  Are you having pain? No  SOZO SCREENING: Patient was assessed today using the SOZO machine to determine the lymphedema index score. This was compared to her baseline score. It was  determined that she is within the recommended range when compared to her baseline and no further action is needed at this time. She will continue SOZO screenings. These are done every 3 months for 2 years post operatively followed by every 6 months for 2 years, and then annually.   L-DEX FLOWSHEETS - 09/14/24 1600       L-DEX LYMPHEDEMA SCREENING   Measurement Type Unilateral    L-DEX MEASUREMENT EXTREMITY Upper Extremity    POSITION  Standing    DOMINANT SIDE Right    At Risk Side Left    BASELINE SCORE (UNILATERAL) 0     L-DEX SCORE (UNILATERAL) -1.4    VALUE CHANGE (UNILAT) -1.4            Aden Berwyn Caldron, PTA 09/14/2024, 4:20 PM

## 2024-09-18 ENCOUNTER — Inpatient Hospital Stay: Admission: RE | Admit: 2024-09-18 | Discharge: 2024-09-18 | Attending: Nurse Practitioner

## 2024-09-18 DIAGNOSIS — C50412 Malignant neoplasm of upper-outer quadrant of left female breast: Secondary | ICD-10-CM

## 2024-11-13 ENCOUNTER — Other Ambulatory Visit (HOSPITAL_COMMUNITY)

## 2024-12-14 ENCOUNTER — Ambulatory Visit: Attending: General Surgery

## 2025-01-01 ENCOUNTER — Inpatient Hospital Stay

## 2025-01-01 ENCOUNTER — Inpatient Hospital Stay: Admitting: Nurse Practitioner

## 2025-07-09 ENCOUNTER — Encounter: Payer: Self-pay | Admitting: Nurse Practitioner
